# Patient Record
Sex: Female | Born: 1945
Health system: Southern US, Community
[De-identification: ages and names within clinical notes are randomized; demographics above are authoritative.]

## PROBLEM LIST (undated history)

## (undated) DIAGNOSIS — J302 Other seasonal allergic rhinitis: Secondary | ICD-10-CM

## (undated) DIAGNOSIS — I2510 Atherosclerotic heart disease of native coronary artery without angina pectoris: Secondary | ICD-10-CM

## (undated) DIAGNOSIS — I10 Essential (primary) hypertension: Secondary | ICD-10-CM

## (undated) DIAGNOSIS — E119 Type 2 diabetes mellitus without complications: Secondary | ICD-10-CM

## (undated) DIAGNOSIS — M1990 Unspecified osteoarthritis, unspecified site: Secondary | ICD-10-CM

## (undated) DIAGNOSIS — K219 Gastro-esophageal reflux disease without esophagitis: Secondary | ICD-10-CM

## (undated) DIAGNOSIS — C801 Malignant (primary) neoplasm, unspecified: Secondary | ICD-10-CM

## (undated) DIAGNOSIS — E7800 Pure hypercholesterolemia, unspecified: Secondary | ICD-10-CM

## (undated) DIAGNOSIS — K760 Fatty (change of) liver, not elsewhere classified: Secondary | ICD-10-CM

## (undated) HISTORY — PX: OTHER SURGICAL HISTORY: SHX169

## (undated) HISTORY — DX: Type 2 diabetes mellitus without complications: E11.9

## (undated) HISTORY — DX: Atherosclerotic heart disease of native coronary artery without angina pectoris: I25.10

## (undated) HISTORY — DX: Essential (primary) hypertension: I10

## (undated) HISTORY — DX: Malignant (primary) neoplasm, unspecified: C80.1

## (undated) HISTORY — PX: PARTIAL HYSTERECTOMY: SHX80

## (undated) HISTORY — DX: Pure hypercholesterolemia, unspecified: E78.00

---

## 2001-04-13 ENCOUNTER — Encounter: Payer: Self-pay | Admitting: Family Medicine

## 2001-04-13 ENCOUNTER — Ambulatory Visit (HOSPITAL_COMMUNITY): Admission: RE | Admit: 2001-04-13 | Discharge: 2001-04-13 | Payer: Self-pay | Admitting: Family Medicine

## 2001-06-23 ENCOUNTER — Other Ambulatory Visit: Admission: RE | Admit: 2001-06-23 | Discharge: 2001-06-23 | Payer: Self-pay | Admitting: Family Medicine

## 2002-04-15 ENCOUNTER — Encounter: Payer: Self-pay | Admitting: Family Medicine

## 2002-04-15 ENCOUNTER — Ambulatory Visit (HOSPITAL_COMMUNITY): Admission: RE | Admit: 2002-04-15 | Discharge: 2002-04-15 | Payer: Self-pay | Admitting: Family Medicine

## 2002-09-15 ENCOUNTER — Ambulatory Visit (HOSPITAL_COMMUNITY): Admission: RE | Admit: 2002-09-15 | Discharge: 2002-09-15 | Payer: Self-pay | Admitting: Internal Medicine

## 2002-10-21 ENCOUNTER — Ambulatory Visit (HOSPITAL_COMMUNITY): Admission: RE | Admit: 2002-10-21 | Discharge: 2002-10-21 | Payer: Self-pay | Admitting: Internal Medicine

## 2002-10-21 ENCOUNTER — Encounter: Payer: Self-pay | Admitting: Internal Medicine

## 2002-11-22 ENCOUNTER — Ambulatory Visit (HOSPITAL_COMMUNITY): Admission: RE | Admit: 2002-11-22 | Discharge: 2002-11-22 | Payer: Self-pay | Admitting: General Surgery

## 2003-04-28 ENCOUNTER — Encounter: Payer: Self-pay | Admitting: Family Medicine

## 2003-04-28 ENCOUNTER — Ambulatory Visit (HOSPITAL_COMMUNITY): Admission: RE | Admit: 2003-04-28 | Discharge: 2003-04-28 | Payer: Self-pay | Admitting: Family Medicine

## 2004-04-30 ENCOUNTER — Ambulatory Visit (HOSPITAL_COMMUNITY): Admission: RE | Admit: 2004-04-30 | Discharge: 2004-04-30 | Payer: Self-pay | Admitting: Internal Medicine

## 2005-05-02 ENCOUNTER — Ambulatory Visit (HOSPITAL_COMMUNITY): Admission: RE | Admit: 2005-05-02 | Discharge: 2005-05-02 | Payer: Self-pay | Admitting: Family Medicine

## 2005-10-23 ENCOUNTER — Ambulatory Visit: Payer: Self-pay | Admitting: Internal Medicine

## 2005-10-25 ENCOUNTER — Ambulatory Visit (HOSPITAL_COMMUNITY): Admission: RE | Admit: 2005-10-25 | Discharge: 2005-10-25 | Payer: Self-pay | Admitting: Urgent Care

## 2005-11-19 ENCOUNTER — Ambulatory Visit: Payer: Self-pay | Admitting: Internal Medicine

## 2006-05-23 ENCOUNTER — Ambulatory Visit (HOSPITAL_COMMUNITY): Admission: RE | Admit: 2006-05-23 | Discharge: 2006-05-23 | Payer: Self-pay | Admitting: Family Medicine

## 2006-12-02 HISTORY — PX: CHOLECYSTECTOMY: SHX55

## 2007-05-29 ENCOUNTER — Ambulatory Visit (HOSPITAL_COMMUNITY): Admission: RE | Admit: 2007-05-29 | Discharge: 2007-05-29 | Payer: Self-pay | Admitting: Family Medicine

## 2007-09-11 ENCOUNTER — Inpatient Hospital Stay (HOSPITAL_COMMUNITY): Admission: EM | Admit: 2007-09-11 | Discharge: 2007-09-13 | Payer: Self-pay | Admitting: Emergency Medicine

## 2007-09-11 ENCOUNTER — Encounter: Payer: Self-pay | Admitting: Emergency Medicine

## 2007-09-11 HISTORY — PX: CARDIAC CATHETERIZATION: SHX172

## 2009-06-06 ENCOUNTER — Ambulatory Visit (HOSPITAL_COMMUNITY): Admission: RE | Admit: 2009-06-06 | Discharge: 2009-06-06 | Payer: Self-pay | Admitting: Family Medicine

## 2010-12-22 ENCOUNTER — Encounter: Payer: Self-pay | Admitting: Internal Medicine

## 2011-04-16 NOTE — Discharge Summary (Signed)
[name], [name]                 ACCOUNT NO.:  [id number]   MEDICAL RECORD NO.:  [id number]          PATIENT TYPE:  INP   LOCATION:  3729                         FACILITY:  [facility]   PHYSICIAN:  Darlin Priestly, MD  DATE OF BIRTH:  Apr 30, 1946   DATE OF ADMISSION:  09/11/2007  DATE OF DISCHARGE:  09/13/2007                               DISCHARGE SUMMARY   DISCHARGE DIAGNOSES:  1. Non-ST elevation myocardial infarction.  2. Coronary artery disease three-vessel undergoing cardiac      catheterization and PTCA and non-drug-eluting Liberte stent to the      left circumflex midportion reducing 100% stenosis to less than 10%.      a.     Residual coronary disease of 80% LAD stenosis that will be       addressed in the future.  Please note ejection fraction was 60%.  3. Hypertension.  4. Dyslipidemia.   DISCHARGE CONDITION:  Improved.   PROCEDURES:  1. Left heart catheterization on September 11, 2007 by Dr. Lynnea Ferrier.  2. September 11, 2007 PTCA and stent deployment to the circumflex by Dr.      Lenise Herald.   DISCHARGE MEDICATIONS:  1. Lopressor 50 mg one twice a day.  2. Altace 2.5 mg daily.  3. Aspirin 325 daily.  4. Plavix 75 mg daily.  Do not stop.  It could cause a heart attack.  5. Zocor 80 mg at bedtime.  6. Imdur 30 mg daily.  7. Laxative as before.  8. Nitroglycerin 1/150th sublingual as needed for chest pain one every      five minutes up to 3/15.  If pain continues, call 911.  9. Stop the blood pressure medicine you are taking.   DISCHARGE INSTRUCTIONS:  1. No work until you see Dr. Lynnea Ferrier.  2. Low salt, heart healthy, low sugar diet.  3. No lifting for two weeks.  No driving for two weeks.  4. Increase activity slowly.  5. Wash right groin catheterization site with soap and water.  Call if      any bleeding, swelling or drainage.  6. Follow with Dr. Lynnea Ferrier.  The office will arrange.   HISTORY OF PRESENT ILLNESS:  A 65 year old African-American female with  no  prior heart disease, some hypertension, but she was given samples  from her primary care physician which she only would take when she  thought she needed it.  The patient on the 7th of October developed left  arm and mid sternal chest pain, no associated symptoms of nausea,  shortness of breath or diaphoresis.  She was unable to sleep because of  the discomfort on the night prior to admission, and she was  significantly uncomfortable.  She went to the emergency room as she  could not take the discomfort anymore.  She was given oxygen and a GI  cocktail as well as nitroglycerin, morphine, and baby aspirin.  Her  troponins were positive, and her MB's were up.  She was then transferred  by [facility] to [facility].  In the emergency room she was without  pain, but  we have a cath lab open so we discussed the risks and benefits  of cardiac catheterization and she agreed to proceed with elective but  urgent catheterization.   Results of catheterization with 100% circumflex occlusion and 80% LAD  stenosis as well.  She underwent stenting and was then admitted to the  CCU.   PAST MEDICAL HISTORY:  Some mild blood pressure elevation treated with  when she felt she needed it meds at her choice.  She has had a  cholecystectomy in 2003 and a colonoscopy in 2003.   ALLERGIES:  No allergies.   Family History, Social History, Review of Systems, see H&P.   PHYSICAL EXAMINATION AT DISCHARGE:  VITAL SIGNS:  Blood pressure 117/68,  pulse 67, temp 98, room air oxygen saturation 99%.  HEART:  Regular rate and rhythm.  No murmur, gallop, rub or click.  LUNGS:  Clear to auscultation bilaterally.  ABDOMEN:  Soft, nontender.  EXTREMITIES:  Cath site stable, pedal pulses are present.   LABORATORY DATA:  Hemoglobin on admission 13.7, hematocrit 40.6, WBC  11.1, platelets 210, neutrophils 79 at discharge, hemoglobin 11,  hematocrit 32.1, WBC had come down to 7.7, platelets 169.   Chemistry on admission:   Sodium 137, potassium 3.7, chloride 103, CO2  27, BUN 9, creatinine 0.83, glucose was slightly up at 173.  Electrolytes essentially remained stable.  Glucose was up during  hospitalization.  She was on D5-1/2 fluids, but we did do a  glycohemoglobin which is still pending.  Coags on admission on heparin  protime 14.5, INR 1.1, PTT greater than 200.  LFTs were entirely normal.  __________ CK 477, MB 47.5, troponin 5.91. That was the peak.  By the  early morning of the 12th CK 320, MB 6.3, troponin I 2.11.   Cholesterol 200, LDL 143, HDL 32, triglycerides 125.  She was discharged  on Zocor 80.  Calcium 8.6.  Magnesium 2. Glycohemoglobin as stated is  pending.  Will need to be followed.  TSH 0.517.   Chest x-rays:  On admission both lungs were clear.  No acute findings  were seen.   DISCHARGE INSTRUCTIONS:  As well as following up with Dr. Lynnea Ferrier we  will also do a 2-D echo as an outpatient.  The office will call to  arrange those.   HOSPITAL COURSE:  [name] was admitted by Dr. Lynnea Ferrier due to non-ST  elevation MI.  She was actually transferred from [facility] and then  seen in the emergency room here at [facility].  Went to the cath lab more  urgently as one was available, and patient was willing to proceed with  procedures as stated.  Post catheterization she did well.  Some right  groin discomfort but no hematoma.  She continued to improve.  At times  her pressure was elevated, and  by October 12 she was seen and examined by Dr. Lynnea Ferrier and felt ready  for discharge.  She will follow up as an outpatient with  Dr. Lynnea Ferrier and then plans to intervene on her LAD will be done at that  time.      [name]      Darlin Priestly, MD  Electronically Signed    LRI/MEDQ  D:  09/13/2007  T:  09/14/2007  Job:  [id number]   cc:   [name], M.D.  Cindy Slot, MD

## 2011-04-16 NOTE — Cardiovascular Report (Signed)
Cindy [name], [name]                 ACCOUNT NO.:  [id number]   MEDICAL RECORD NO.:  [id number]          PATIENT TYPE:  INP   LOCATION:  2914                         FACILITY:  [facility]   PHYSICIAN:  Ritta Slot, MD     DATE OF BIRTH:  Jun 26, 1946   DATE OF PROCEDURE:  09/11/2007  DATE OF DISCHARGE:                            CARDIAC CATHETERIZATION   PROCEDURES PERFORMED:  1. Selective coronary angiography and atrial coronary circulation by      the Judkins technique.  2. Retrograde left heart catheterization.  3. Left ventricular angiography.   COMPLICATIONS:  None.   ENTRY SITE:  Right femoral artery.   VALUES ON THE PATIENT PROFILE:  The patient is a 65 year old lady with  no known risk factors for coronary artery disease who comes in with a 2-  day history of chest discomfort.  She presented to the emergency room up  at [facility] today where she was found to have ST segment  depression inferolaterally with positive enzymes.  She was transferred  here to [facility] for further evaluation.  She was seen in the  emergency room and it was felt that urgent transfer to the cardiac  catheterization lab was prudent as she had ongoing chest pain with  positive enzymes and ECG.   The patient was brought to the second floor of [facility] to  the Cardiac Catheterization Lab.  The right groin was prepped and draped  in the usual sterile fashion.  Lidocaine 1% 15 mL was infiltrated to the  right groin for local anesthesia.  She was given 2 mg of Versed and 25  mcg of fentanyl for sedation intravenously.  The right femoral artery  was accessed using a Cook needle.  The J wire was passed through the  [place] needle, passed up into the aorta.  The Cook needle was removed and  a 6-French sheath was placed over the J wire into the right femoral  artery.  Through the right femoral artery was inserted a JR4 catheter  with the J wire as a guide.  The JR4 catheter was inserted into  the  right coronary artery.  Dye was advanced down the right coronary artery  visualized in the right coronary artery.  The JR4 catheter was then  removed over a J wire and the JL4 catheter was placed down the J wire  into the descending aorta and up the ascending aortic arch and then down  the ascending aorta.  The JL4 catheter was placed into the left main  coronary artery.  Dye was placed down the left main coronary artery for  visualization of the left coronary artery system.  The left coronary  artery system was visualized in the LAO cranial, LAO caudal, RAO caudal,  RAO cranial and AP caudal views.   FINDINGS:  Pressures:  The aortic pressure was 158/77 with a mean of  108.  The left ventricular pressure was 158 with a diastolic of 17, with  an end diastolic of 25.  Pullback pressure in the aorta was 139/77/104.  There was no significant  aortic valve gradient by pullback technique.   FINDINGS:  1. Right coronary artery:  There is diffuse disease down the right      coronary artery.  There was a 60%lesion proximal.  The right      coronary artery was dominant.  2. Left main coronary artery:  The left main coronary artery was free      of any disease.  3. Left circumflex coronary artery was 100% occluded in its proximal      portion with no flow distally.  4. Left anterior descending artery was long and patent throughout its      course, but there was a significant atympanosis just after the      bifurcation at the first diagonal.  5. Left ventriculography:  The left ventriculography was performed,      300 mL of dye were injected at 10 mL per second.  There was no      evidence of a significant LV dysfunction.  Her EF was reported at      60%.   IMPRESSION:  Acute coronary syndrome with 100% occlusion of the left  circumflex artery.  She also had 3-vessel disease with significant  disease in the left anterior descending and right coronary artery.   PLAN:  She will proceed to  percutaneous coronary intervention to the  left circumflex system to abort an acute coronary syndrome.  Dr. Jenne Campus  will be doing this.  Once this has been completed, she will return to  the CCU for inpatient hospitalization.  The left coronary artery will  need to be treated probably within the next week or so with PCI to the  LAD.  She will need to be treated with full medical therapy including  stat aspirin, Plavix and beta blockade.      Ritta Slot, MD  Electronically Signed     HS/MEDQ  D:  09/11/2007  T:  09/12/2007  Job:  [id number]

## 2011-04-16 NOTE — H&P (Signed)
[name], [name]                 ACCOUNT NO.:  [id number]   MEDICAL RECORD NO.:  [id number]           PATIENT TYPE:   LOCATION:                                 FACILITY:   PHYSICIAN:  Cindy Slot, MD     DATE OF BIRTH:  1946-06-26   DATE OF ADMISSION:  DATE OF DISCHARGE:                              HISTORY & PHYSICAL   CHIEF COMPLAINT:  Chest pain and with radiation to the left arm since  Wednesday x2 days now.   HISTORY OF PRESENT ILLNESS:  A 65 year old African American female with  no prior history of coronary disease, some history of hypertension (she  was given samples by primary care, but she only takes it once in a  while, she does not know the name).  She had been in her usual state of  health until this Wednesday, September 09, 2007; she developed left arm  discomfort, and midsternal chest pain, no radiation to her back.  No  associated symptoms of nausea, shortness of breath, or diaphoresis, but  it would kind of come-and-go.  She was unable to sleep now for two  nights, and she was very frustrated.  She thought it was something she  ate originally, but by this morning she felt she had to be seen so she  went to the emergency room.  There she was given oxygen, IV Protonix, IV  Pepcid.  Also she was started on 1000 mg of IV heparin bolus, and a 5000  unit bolus and a drip at 1000, and then a nitroglycerin drip.  Her  troponin was elevated at 0.21, this was the initial one; and the MB was  9.4 which was positive.  The ER physician called Dr. Lynnea [name] here, at  [facility], we transferred her to [facility] by [facility].  Here, she was  totally pain free, and nonspecific ST changes in her lateral leads.  Due  to the type of pain, and the availability of the cath lab.  We did an  urgent elective cath.  I discussed risks with the patient; a 1% chance  of stroke, heart attack or death with cardiac cath, and chance of  hematoma at the site.  She was agreeable to proceed, and we  have taken  her to the cath lab.   PAST MEDICAL HISTORY:  No prior cardiac cath; no history of any chest  pain, though she is a little vague when asked about previous chest pain;  she does state that she gets stressed at work because she does a lot of  lifting.  She does have a history of hypertension.  She was given some  meds by her primary care physician, some samples, but she only takes  those once in a while.  Other history includes cholecystectomy in 2003,  and a colonoscopy in 2003.  The colonoscopy was fine.   ALLERGIES:  No known allergies.  Denies shellfish or iodine allergies.   OUTPATIENT MEDS:  Occasional blood pressure med and MiraLax or some type  of powdered Maalox, she is unsure.   SOCIAL HISTORY:  She lives in [place].  She is single.  She has one  daughter and three grandchildren.  She never smoked, never used tobacco.  No alcohol use.  She exercises by walking.  She works on a daily basis  lifting heavy items.   FAMILY HISTORY:  Mother is a living at age 61, she has a pacemaker,  unknown if she has coronary disease.  Father unknown history.  Two  sisters, two brothers, but no coronary disease that the patient is aware  of.   REVIEW OF SYSTEMS:  GENERAL: No recent colds or fevers.  No specific  weight changes.  HEENT: She has some nasal congestion, now, but no  headache, no blurred vision.  SKIN: Without rashes.  CARDIOVASCULAR: As  stated with chest pain.  GENITOURINARY:  No hematuria or dysuria.  NEURO:  Some increase stress at work due to the economic time.  MUSCULOSKELETAL:  No arthritic pain.  GASTROINTESTINAL: Occasional  constipation.  She takes some type of a stool softener daily, in a  powdered form, but no melena.  ENDOCRINE:  No diabetes that she is aware  of, no thyroid disease.  No history of syncope either.   PHYSICAL EXAM:  VITAL SIGNS:  Blood pressure 163/85, pulse 69,  respiratory rate 16, temperature 98.5, oxygen saturation on room air   100%.  GENERAL:  Alert and oriented African-American female in no acute  distress now.  Pleasant affect.  SKIN:  Warm and dry, brisk capillary refill.  HEENT: Sclerae are clear.  Normocephalic.  NECK:  Supple.  No JVD, no bruits.  No adenopathy.  LUNGS:  Clear without rales, rhonchi, or wheezes.  HEART:  Heart sounds S1-S2 regular rate and rhythm without murmur,  gallop, rub, or click.  ABDOMEN:  Soft, nontender, positive bowel sounds.  I do not palpate  liver, spleen, or masses.  EXTREMITIES:  Lower extremities without edema, 2+ pedals bilaterally.  NEURO:  Alert and oriented x3.  Follows commands, moves all extremities.   Chest x-ray is pending.  EKG heart rate was 64 no Q waves, but peak T  wave in V2 and nonspecific ST changes in her lateral leads.   LABORATORY DATA:  Hemoglobin 13.7, hematocrit 40.6, WBC slightly up at  11.1.  BUN 9, creatinine 0.86 and MB is 9.4, troponin 0.21, magnesium is  2.0.  INR of 1.1 and LFTs were normal.  The patient was seen and  examined by Dr. Lynnea [name].  She is on IV heparin, IV nitroglycerin,  and  she is being taken to the cath lab for further evaluation with non-Q-  wave MI non-ST elevation MI.  She will be admitted to the CCU.  We have  attempted to call her daughter several times, there is no answer.  We  did reach a friend of [name] who will be coming to the hospital.      [name], N.P.      Cindy Slot, MD  Electronically Signed    LRI/MEDQ  D:  09/11/2007  T:  09/12/2007  Job:  [id number]   cc:   [name], M.D.  Cindy Slot, MD

## 2011-04-16 NOTE — Cardiovascular Report (Signed)
[name], [name]                 ACCOUNT NO.:  [id number]   MEDICAL RECORD NO.:  [id number]          PATIENT TYPE:  INP   LOCATION:  3729                         FACILITY:  [facility]   PHYSICIAN:  Darlin Priestly, MD  DATE OF BIRTH:  July 21, 1946   DATE OF PROCEDURE:  09/11/2007  DATE OF DISCHARGE:  09/11/2007                            CARDIAC CATHETERIZATION   PROCEDURE:  1. Left circumflex - mid. Percutaneous transluminal coronary balloon      angioplasty.  2. Placement of intracoronary stent.   ATTENDING SURGEON:  Darlin Priestly, M.D.   COMPLICATIONS:  None.   INDICATIONS:  [name] is a 65 year old female, patient of [facility], with a history of hypertension, questionable noncompliance, who  began complaining of intermittent chest pain on and off for the last  several days.  This morning after approximately 8:00 a.m., she noted  marked increase in her substernal chest pain and was subsequently seen  at the [facility] ER where it was felt she had acute ischemia.  She  subsequently transferred to [facility] for urgent catheterization which  was performed by Dr. Lynnea Ferrier.  This revealed scattered sequential 80%  lesions in the LAD, totally occluded mid AV groove circumflex and  scattered disease throughout the proximal RCA with normal EF.  She is  now referred for percutaneous intervention of the circumflex during  acute MI.   DESCRIPTION OF OPERATION:  After obtaining informed consent, the  diagnostic catheters were removed, and a 6-French XB 3.5 guiding  catheter with side holes engaged in the left coronary ostium.  Next, a  0.014 [place] guidewire was advanced down the guiding catheter and used  to cross the mid AV groove circumflex stenotic lesion and positioned in  the second OM without difficulty.  Over this, a 2.5 x 15 Maverick  balloon was then used to cross the total occlusion, and 2 inflations to  a maximum of 10 atmospheres was performed for a total of 27  seconds.  Follow-up angiogram revealed good luminal gain with a small linear  dissection present and TIMI 3 flow.  This balloon was removed, and a  Liberte 22.75 x 16-mm stent was then positioned across the mid AV groove  stenotic lesion.  It should be noted this stent was chosen as a non-DES  stent secondary to the questionable history of the patient's  noncompliance.  This stent was then deployed to 9 atmospheres for a  total of 14 seconds.  Follow-up angiogram revealed excellent luminal  gain.  Stent balloon was then removed, and a Quantum Maverick 2.75 x 8-  mm balloon was then positioned in the distal portion of the stent and 2  overlapping inflations to a maximum of 16 atmospheres were performed for  total of 30 seconds.  Follow-up angiogram revealed no evidence of  dissection or thrombus with TIMI 3 flow in the distal vessel.  IV  Integrilin was used throughout the case.  Intravenous dose of heparin  given to maintain an ACE between 200 and 300.   Final orthogonal angiograms revealed less than 10% residual stenosis  in  the AV groove circumflex with TIMI 3 flow in the distal vessel.  At this  point, we elected to conclude the procedure.  All balloons, wires and  catheters removed.  Hemostatic sheath was sewn in place, and the patient  was transferred back to the ward in stable condition.   CONCLUSION:  1. Successful percutaneous transluminal coronary balloon angioplasty.      Placement of a Liberte 2.75 x 16-mm stent (non-drug-eluting stent)      in the mid AV groove circumflex stenotic lesion postdilated to 2.78      mm.  2. Significant three-vessel coronary artery disease.  3. Adjuvant use of Integrilin infusion.  4. Normal LV systolic function.  5. Systemic hypertension.      Darlin Priestly, MD  Electronically Signed     RHM/MEDQ  D:  09/11/2007  T:  09/12/2007  Job:  [id number]   cc:   [facility]  Ritta Slot, MD

## 2011-04-19 NOTE — Op Note (Signed)
NAME:  [name], [name]                           ACCOUNT NO.:  [id number]   MEDICAL RECORD NO.:  [id number]                   PATIENT TYPE:  AMB   LOCATION:  DAY                                  FACILITY:  [facility]   PHYSICIAN:  Dalia Heading, M.D.               DATE OF BIRTH:  Dec 19, 1945   DATE OF PROCEDURE:  DATE OF DISCHARGE:                                 OPERATIVE REPORT   PREOPERATIVE DIAGNOSIS:  Biliary colic, cholelithiasis.   POSTOPERATIVE DIAGNOSIS:  Biliary colic, cholelithiasis.   PROCEDURE:  Laparoscopic cholecystectomy   SURGEON:  Dalia Heading, M.D.   ANESTHESIA:  General endotracheal   INDICATIONS:  The patient is a 65 year old black female who was referred for  evaluation and treatment of biliary colic secondary to cholelithiasis.  The  risks and benefits of the procedure including bleeding, infection,  hepatobiliary injury, and the possibility of an open procedure were fully  explained to the patient, who gave informed consent.   DESCRIPTION OF PROCEDURE:  The patient was placed in the supine position.  After induction of general endotracheal anesthesia, the abdomen was prepped  and draped using the usual sterile technique with Betadine.  Surgical site  confirmation was performed.   An supraumbilical incision was made down to the fascia.  A Veress needle was  introduced into the abdominal cavity and confirmation of placement was done  using the saline drop test.  The abdomen was then insufflated to 16 mmHg  pressure.  An 11-mm trocar was introduced into the abdominal cavity under  direct visualization without difficulty.  The patient was placed then in  reverse Trendelenburg position and an additional 11-mm trocar was placed in  the epigastric region and 5-mm trocars were placed in the right upper  quadrant and right flank regions. The liver was inspected and noted to be  within normal limits.  The gallbladder was retracted superiorly and  laterally.  The  dissection was begun around the infundibulum of the  gallbladder.  The cystic duct was first identified.  Its junction to the  infundibulum fully identified.  Endoclips were placed proximally and  distally on the cystic duct; and the cystic duct was divided.  This was  likewise done on the cystic artery.  The gallbladder was then freed away  from the gallbladder fossa using Bovie electrocautery.  The gallbladder was  delivered through the epigastric trocar site using an EndoCatch bag.  The  gallbladder fossa was inspected and no abnormal bleeding or bile leakage was  noted.  Surgicel was placed in the gallbladder fossa, the subhepatic space,  as well as the right hepatic gutter were irrigated with normal saline.  All  fluid and air were then evacuated from the abdominal cavity prior to removal  of the trocars.   All wounds were irrigated with normal saline.  All wounds were injected with  0.5% Sensorcaine.  The  supraumbilical fascia as well as epigastric fascia  were reapproximated using an #0 Vicryl interrupted suture. All skin  incisions were closed using staples.  Betadine ointment and dry sterile  dressings were applied.   All tape and needle counts correct at the end of the procedure.  The patient  was extubated in the operating room and went back to recovery room in awake  and stable condition.   COMPLICATIONS:  None.   SPECIMENS:  Gallbladder with stones.   BLOOD LOSS:  Minimal.                                               Dalia Heading, M.D.    MAJ/MEDQ  D:  11/22/2002  T:  11/22/2002  Job:  [id number]   cc:   Madelin Rear. Sherwood Gambler, M.D.  [address]  [place]  [place] [address]  Fax: [phone]

## 2011-04-19 NOTE — H&P (Signed)
   NAME:  DYLLAN, KATS                           ACCOUNT NO.:  [id number]   MEDICAL RECORD NO.:  [id number]                  PATIENT TYPE:   LOCATION:                                       FACILITY:  [facility]   PHYSICIAN:  Dalia Heading, M.D.               DATE OF BIRTH:  31-Jan-1946   DATE OF ADMISSION:  DATE OF DISCHARGE:                                HISTORY & PHYSICAL   CHIEF COMPLAINT:  Biliary cholic secondary to cholelithiasis.   HISTORY OF PRESENT ILLNESS:  The patient is a 65 year old black female who  is referred for evaluation and treatment of biliary colic secondary to  cholelithiasis.  She has been having  right upper quadrant abdominal pain  intermittently for the past few months.  It seems to be getting worse.  No  fever, chills, or jaundice have been noted.  There is no history of nausea  and vomiting.  She avoids fatty foods.   PAST MEDICAL HISTORY:  Unremarkable.   PAST SURGICAL HISTORY:  Hysterectomy in the remote past.   ALLERGIES:  No known drug allergies.   CURRENT MEDICATIONS:  None.   REVIEW OF SYSTEMS:  Noncontributory.   PHYSICAL EXAMINATION:  On physical examination, the patient is a well-  nourished, well-developed black female in no acute distress.  She is  afebrile.  Vital signs are stable.  HEENT EXAMINATION:  Reveals no scleritis.  LUNGS:  Clear to auscultation, with equal breath sounds bilaterally.  HEART EXAM:  Regular rate and rhythm without S3, S4, murmurs.  ABDOMEN:  Soft with tenderness noted in the right lower quadrant to  palpation.  No hepatosplenomegaly, masses, hernias, rigidity noted.  Ultrasound of the gallbladder reveals a single gallstone with a normal  common bile duct.   IMPRESSION:  Biliary colic, cholelithiasis.    PLAN:  The patient is scheduled for a laporascopic cholecystectomy on  November 22, 2002.  The risks and benefits of the procedure, including  bleeding, infection, hepatobiliary injury, the open of an open  procedure  were fully explained to the patient, gained informed consent.                                                 Dalia Heading, M.D.    MAJ/MEDQ  D:  11/09/2002  T:  11/09/2002  Job:  [id number]   cc:   Madelin Rear. Sherwood Gambler, M.D.  [address]  [place]  [place] [address]  Fax: [phone]

## 2011-04-19 NOTE — Op Note (Signed)
NAME:  [name], [name]                           ACCOUNT NO.:  [id number]   MEDICAL RECORD NO.:  [id number]                   PATIENT TYPE:  AMB   LOCATION:  DAY                                  FACILITY:  [facility]   PHYSICIAN:  R. Roetta Sessions, M.D.              DATE OF BIRTH:  01/14/1946   DATE OF PROCEDURE:  09/15/2002  DATE OF DISCHARGE:                                 OPERATIVE REPORT   PROCEDURE:  Screening colonoscopy.   ENDOSCOPIST:  Gerrit Friends. Rourk, M.D.   INDICATIONS FOR PROCEDURE:  The patient is a 65-year-old lady devoid of any  lower GI tract symptoms.  No family history of colorectal neoplasia.  She  has never had her lower GI tract imaged.  She was sent over by Dr. Nobie Putnam  for colorectal cancer screening.  Colonoscopy is now being done as standard  screening maneuver.  This approach has been discussed with the patient at  length at the bedside.  The potential risks, benefits, and alternatives have  been reviewed, questions answered.  She is agreeable.  I think she is low  risk for conscious sedation.   DESCRIPTION OF PROCEDURE:  O2 saturation, blood pressure, pulse and  respirations were monitored throughout the entire procedure.   CONSCIOUS SEDATION:  Versed 3 mg IV, Demerol 50 mg IV in divided doses.   INSTRUMENT:  Olympus video chip adult colonoscope.   FINDINGS:  Digital rectal exam revealed no abnormalities.   ENDOSCOPIC FINDINGS:  The prep was adequate.   RECTUM:  Examination of the rectal mucosa including the retroflex view of  the anal verge revealed no abnormalities.   COLON:  The colonic mucosa was surveyed from the rectosigmoid junction  through the left transverse and right colon to the area of the appendiceal  orifice, ileocecal valve, and cecum.  These structures were well seen and  photographed for the record.  The colonic mucosa all the way to the cecum  appeared normal.   From the level of the cecum and ileocecal valve the scope was slowly  withdrawn; and all previously mentioned mucosal surfaces were again seen;  and, again, no abnormalities were observed.  The patient tolerated the  procedure well and was reacted in endoscopy.   IMPRESSION:  1. Normal rectum.  2. Normal colon.    RECOMMENDATIONS:  1. Follow up with Dr. Nobie Putnam.  2. Repeat colonoscopy in 10 years.                                               Jonathon Bellows, M.D.    RMR/MEDQ  D:  09/15/2002  T:  09/15/2002  Job:  [id number]   cc:   Patrica Duel, MD  [address]  [place]  [place] [address]  Fax: [phone]

## 2011-09-12 LAB — PROTIME-INR
INR: 1.1
Prothrombin Time: 14.5

## 2011-09-12 LAB — LIPID PANEL
Triglycerides: 125
VLDL: 25

## 2011-09-12 LAB — BASIC METABOLIC PANEL
BUN: 7
CO2: 26
Calcium: 8.8
Chloride: 109
Creatinine, Ser: 0.85
GFR calc Af Amer: >60
GFR calc non Af Amer: >60
Glucose, Bld: 120 — ABNORMAL HIGH
Potassium: 3.7
Potassium: 3.6
Sodium: 137

## 2011-09-12 LAB — TSH: TSH: 0.517

## 2011-09-12 LAB — CBC
HCT: 40.6
HCT: 32.1 — ABNORMAL LOW
HCT: 33.9 — ABNORMAL LOW
Hemoglobin: 13.7
MCHC: 33.5
MCV: 85.2
Platelets: 169
Platelets: 176
RDW: 12.5
RDW: 12.5
RDW: 12.6
WBC: 11.1 — ABNORMAL HIGH
WBC: 7.5

## 2011-09-12 LAB — CARDIAC PANEL(CRET KIN+CKTOT+MB+TROPI)
Relative Index: 2
Total CK: 320 — ABNORMAL HIGH
Total CK: 477 — ABNORMAL HIGH
Troponin I: 2.11
Troponin I: 5.91

## 2011-09-12 LAB — POCT CARDIAC MARKERS
CKMB, poc: 9.4
Operator id: 218581
Troponin i, poc: 0.21 — ABNORMAL HIGH

## 2011-09-12 LAB — COMPREHENSIVE METABOLIC PANEL
AST: 31
AST: 35
Albumin: 3.1 — ABNORMAL LOW
Albumin: 3.9
Alkaline Phosphatase: 81
BUN: 11
BUN: 9
Chloride: 102
Creatinine, Ser: 0.8
Creatinine, Ser: 0.87
GFR calc Af Amer: >60
GFR calc Af Amer: >60
Potassium: 3.7
Potassium: 3.9
Total Bilirubin: 1
Total Protein: 6
Total Protein: 7.4

## 2011-09-12 LAB — DIFFERENTIAL
Basophils Absolute: 0
Eosinophils Relative: 1
Lymphocytes Relative: 14
Lymphs Abs: 1.5
Monocytes Absolute: 0.6
Neutro Abs: 8.8 — ABNORMAL HIGH

## 2011-09-12 LAB — GLYCOHEMOGLOBIN, TOTAL: Hemoglobin-A1c: 6.3 % — ABNORMAL HIGH (ref <6.0)

## 2011-12-27 HISTORY — PX: OTHER SURGICAL HISTORY: SHX169

## 2012-03-24 HISTORY — PX: CARDIOVASCULAR STRESS TEST: SHX262

## 2012-05-21 ENCOUNTER — Encounter (INDEPENDENT_AMBULATORY_CARE_PROVIDER_SITE_OTHER): Payer: Self-pay | Admitting: *Deleted

## 2012-05-27 ENCOUNTER — Telehealth (INDEPENDENT_AMBULATORY_CARE_PROVIDER_SITE_OTHER): Payer: Self-pay | Admitting: *Deleted

## 2012-05-27 ENCOUNTER — Other Ambulatory Visit (INDEPENDENT_AMBULATORY_CARE_PROVIDER_SITE_OTHER): Payer: Self-pay | Admitting: *Deleted

## 2012-05-27 ENCOUNTER — Ambulatory Visit (INDEPENDENT_AMBULATORY_CARE_PROVIDER_SITE_OTHER): Payer: Medicare Other | Admitting: Internal Medicine

## 2012-05-27 ENCOUNTER — Encounter (INDEPENDENT_AMBULATORY_CARE_PROVIDER_SITE_OTHER): Payer: Self-pay | Admitting: Internal Medicine

## 2012-05-27 VITALS — BP 118/70 | HR 70 | Temp 98.9°F | Ht 65.0 in | Wt 180.7 lb

## 2012-05-27 DIAGNOSIS — R1031 Right lower quadrant pain: Secondary | ICD-10-CM

## 2012-05-27 DIAGNOSIS — I2510 Atherosclerotic heart disease of native coronary artery without angina pectoris: Secondary | ICD-10-CM | POA: Insufficient documentation

## 2012-05-27 DIAGNOSIS — R7401 Elevation of levels of liver transaminase levels: Secondary | ICD-10-CM

## 2012-05-27 DIAGNOSIS — R52 Pain, unspecified: Secondary | ICD-10-CM

## 2012-05-27 DIAGNOSIS — R109 Unspecified abdominal pain: Secondary | ICD-10-CM | POA: Insufficient documentation

## 2012-05-27 DIAGNOSIS — Z1211 Encounter for screening for malignant neoplasm of colon: Secondary | ICD-10-CM

## 2012-05-27 DIAGNOSIS — E7800 Pure hypercholesterolemia, unspecified: Secondary | ICD-10-CM | POA: Insufficient documentation

## 2012-05-27 DIAGNOSIS — I10 Essential (primary) hypertension: Secondary | ICD-10-CM | POA: Insufficient documentation

## 2012-05-27 DIAGNOSIS — R748 Abnormal levels of other serum enzymes: Secondary | ICD-10-CM

## 2012-05-27 NOTE — Progress Notes (Signed)
Subjective:     Patient ID: [name], female   DOB: 1946/08/23, 66 y.o.   MRN: [id number]  HPI Referred to our office by Dr. Alanda Amass to rt lower quadrant. She has had this pain off and on x 2-3 yrs.  She underwent a CT scan in the past and was normal. She underwent a colonoscopy in 2003 and was normal which was normal by Dr, Jena Gauss. Appetite is okay. No weight loss.  Occasionally acid reflux. She usually has a BM about 1-2 a day.  No changes in her stools. No melena or bright red rectal bleeding.  She rates the pain 5/10. The pain is constant.  Motrin will relieve the pain.   She has a hx of elevated transaminases while on lipid lower medications.  01/27/2012 AST 80, ALT 66.  Review of Systems see hpi Current Outpatient Prescriptions  Medication Sig Dispense Refill  . aspirin 81 MG tablet Take 81 mg by mouth daily.      . clopidogrel (PLAVIX) 75 MG tablet Take 75 mg by mouth daily.      Marland Kitchen glipiZIDE (GLUCOTROL XL) 5 MG 24 hr tablet Take 5 mg by mouth daily.      . metFORMIN (GLUCOPHAGE) 500 MG tablet Take 500 mg by mouth 2 (two) times daily with a meal.      . metoprolol succinate (TOPROL-XL) 50 MG 24 hr tablet Take 50 mg by mouth daily. Take with or immediately following a meal.      . pantoprazole (PROTONIX) 40 MG tablet Take 40 mg by mouth daily.      . ramipril (ALTACE) 10 MG tablet Take 10 mg by mouth daily.      . rosuvastatin (CRESTOR) 5 MG tablet Take 5 mg by mouth daily.       Current Outpatient Prescriptions on File Prior to Visit  Medication Sig Dispense Refill  . glipiZIDE (GLUCOTROL XL) 5 MG 24 hr tablet Take 5 mg by mouth daily.      . metFORMIN (GLUCOPHAGE) 500 MG tablet Take 500 mg by mouth 2 (two) times daily with a meal.      . metoprolol succinate (TOPROL-XL) 50 MG 24 hr tablet Take 50 mg by mouth daily. Take with or immediately following a meal.      . pantoprazole (PROTONIX) 40 MG tablet Take 40 mg by mouth daily.      . ramipril (ALTACE) 10 MG tablet Take 10 mg  by mouth daily.      . rosuvastatin (CRESTOR) 5 MG tablet Take 5 mg by mouth daily.       Past Medical History  Diagnosis Date  . Hypertension   . CAD (coronary artery disease)   . Diabetes mellitus     type 2 x  1 or 2 yrs   Past Surgical History  Procedure Date  . Left breast lumpectomy   . Partial hysterectomy   . Cholecystectomy 2008  . Coronary angioplasty with stent placement    History   Social History  . Marital Status: Single    Spouse Name: N/A    Number of Children: N/A  . Years of Education: N/A   Occupational History  . Not on file.   Social History Main Topics  . Smoking status: Never Smoker   . Smokeless tobacco: Not on file  . Alcohol Use: No  . Drug Use: No  . Sexually Active: Not on file   Other Topics Concern  . Not on file  Social History Narrative  . No narrative on file   Family Status  Relation Status Death Age  . Mother Deceased     CAD  . Sister Alive     good health  . Brother Alive     good health   No Known Allergies      Objective:   Physical Exam Filed Vitals:   05/27/12 1514  Height: 5\' 5"  (1.651 m)  Weight: 180 lb 11.2 oz (81.965 kg)       Assessment:   Rt lower quadrant pain for several years duration. ? Etiology. Elevated liver enzymes in the past. In need of screening colonoscopy.    Plan:    Colonoscopy with Dr. Karilyn Cota.  CT. Abdomen and pelvis for rt lower quadrant pain. cmet today

## 2012-05-27 NOTE — Telephone Encounter (Signed)
Patient needs movi prep 

## 2012-05-27 NOTE — Patient Instructions (Addendum)
Colonoscopy with Dr. Karilyn Cota. CT abdomen and pelvis with CM for abdominal pain.  Cmet today.

## 2012-05-28 ENCOUNTER — Telehealth (INDEPENDENT_AMBULATORY_CARE_PROVIDER_SITE_OTHER): Payer: Self-pay | Admitting: Internal Medicine

## 2012-05-28 LAB — COMPREHENSIVE METABOLIC PANEL
Albumin: 4.5 g/dL (ref 3.5–5.2)
BUN: 12 mg/dL (ref 6–23)
Calcium: 9.4 mg/dL (ref 8.4–10.5)
Chloride: 101 mEq/L (ref 96–112)
Glucose, Bld: 81 mg/dL (ref 70–99)
Potassium: 4.4 mEq/L (ref 3.5–5.3)

## 2012-05-28 MED ORDER — PEG-KCL-NACL-NASULF-NA ASC-C 100 G PO SOLR: 1.0000 | Freq: Once | ORAL | Status: DC

## 2012-05-28 NOTE — Telephone Encounter (Signed)
Will get a Abdominal US for elevated transaminases.

## 2012-05-28 NOTE — Addendum Note (Signed)
Addended by: Len Blalock on: 05/28/2012 03:44 PM   Modules accepted: Orders

## 2012-05-28 NOTE — Telephone Encounter (Signed)
[place] sch'd 06/01/12 @ 1045, npo after midnight, patient aware

## 2012-05-29 ENCOUNTER — Encounter (INDEPENDENT_AMBULATORY_CARE_PROVIDER_SITE_OTHER): Payer: Self-pay

## 2012-06-01 ENCOUNTER — Ambulatory Visit (HOSPITAL_COMMUNITY)
Admission: RE | Admit: 2012-06-01 | Discharge: 2012-06-01 | Disposition: A | Discharge status: Home / Self Care | Payer: Medicare Other | Source: Ambulatory Visit | Attending: Internal Medicine | Admitting: Internal Medicine

## 2012-06-01 DIAGNOSIS — E119 Type 2 diabetes mellitus without complications: Secondary | ICD-10-CM | POA: Insufficient documentation

## 2012-06-01 DIAGNOSIS — R7401 Elevation of levels of liver transaminase levels: Secondary | ICD-10-CM | POA: Insufficient documentation

## 2012-06-01 DIAGNOSIS — I2510 Atherosclerotic heart disease of native coronary artery without angina pectoris: Secondary | ICD-10-CM | POA: Insufficient documentation

## 2012-06-01 DIAGNOSIS — R7402 Elevation of levels of lactic acid dehydrogenase (LDH): Secondary | ICD-10-CM | POA: Insufficient documentation

## 2012-06-01 DIAGNOSIS — R748 Abnormal levels of other serum enzymes: Secondary | ICD-10-CM

## 2012-06-01 DIAGNOSIS — I10 Essential (primary) hypertension: Secondary | ICD-10-CM | POA: Insufficient documentation

## 2012-06-03 ENCOUNTER — Other Ambulatory Visit (HOSPITAL_COMMUNITY): Payer: Medicare Other

## 2012-06-05 ENCOUNTER — Encounter (HOSPITAL_COMMUNITY): Payer: Self-pay | Admitting: Pharmacy Technician

## 2012-06-09 ENCOUNTER — Telehealth (INDEPENDENT_AMBULATORY_CARE_PROVIDER_SITE_OTHER): Payer: Self-pay | Admitting: Internal Medicine

## 2012-06-09 NOTE — Telephone Encounter (Signed)
Needs Cmet in 1st of August and then one in September.

## 2012-06-10 ENCOUNTER — Telehealth (INDEPENDENT_AMBULATORY_CARE_PROVIDER_SITE_OTHER): Payer: Self-pay | Admitting: *Deleted

## 2012-06-10 DIAGNOSIS — R7401 Elevation of levels of liver transaminase levels: Secondary | ICD-10-CM

## 2012-06-10 NOTE — Telephone Encounter (Signed)
Per Delrae Rend the patient will need to have labs in August and September

## 2012-06-10 NOTE — Telephone Encounter (Signed)
This lab is due in September per [name]

## 2012-06-10 NOTE — Telephone Encounter (Signed)
Labs are noted for August and September

## 2012-06-17 ENCOUNTER — Encounter (HOSPITAL_COMMUNITY): Payer: Self-pay | Admitting: *Deleted

## 2012-06-17 ENCOUNTER — Ambulatory Visit (HOSPITAL_COMMUNITY)
Admission: RE | Admit: 2012-06-17 | Discharge: 2012-06-17 | Disposition: A | Discharge status: Home / Self Care | Payer: Medicare Other | Source: Ambulatory Visit | Attending: Internal Medicine | Admitting: Internal Medicine

## 2012-06-17 ENCOUNTER — Encounter (HOSPITAL_COMMUNITY)
Admission: RE | Disposition: A | Discharge status: Home / Self Care | Payer: Self-pay | Source: Ambulatory Visit | Attending: Internal Medicine

## 2012-06-17 DIAGNOSIS — E119 Type 2 diabetes mellitus without complications: Secondary | ICD-10-CM | POA: Insufficient documentation

## 2012-06-17 DIAGNOSIS — I10 Essential (primary) hypertension: Secondary | ICD-10-CM | POA: Insufficient documentation

## 2012-06-17 DIAGNOSIS — R1031 Right lower quadrant pain: Secondary | ICD-10-CM

## 2012-06-17 DIAGNOSIS — Z1211 Encounter for screening for malignant neoplasm of colon: Secondary | ICD-10-CM

## 2012-06-17 DIAGNOSIS — Z79899 Other long term (current) drug therapy: Secondary | ICD-10-CM | POA: Insufficient documentation

## 2012-06-17 DIAGNOSIS — K5730 Diverticulosis of large intestine without perforation or abscess without bleeding: Secondary | ICD-10-CM | POA: Insufficient documentation

## 2012-06-17 DIAGNOSIS — R198 Other specified symptoms and signs involving the digestive system and abdomen: Secondary | ICD-10-CM

## 2012-06-17 DIAGNOSIS — Z01812 Encounter for preprocedural laboratory examination: Secondary | ICD-10-CM | POA: Insufficient documentation

## 2012-06-17 DIAGNOSIS — R109 Unspecified abdominal pain: Secondary | ICD-10-CM | POA: Insufficient documentation

## 2012-06-17 HISTORY — PX: COLONOSCOPY: SHX5424

## 2012-06-17 HISTORY — DX: Fatty (change of) liver, not elsewhere classified: K76.0

## 2012-06-17 HISTORY — DX: Other seasonal allergic rhinitis: J30.2

## 2012-06-17 HISTORY — DX: Gastro-esophageal reflux disease without esophagitis: K21.9

## 2012-06-17 LAB — GLUCOSE, CAPILLARY: Glucose-Capillary: 106 mg/dL — ABNORMAL HIGH (ref 70–99)

## 2012-06-17 SURGERY — COLONOSCOPY
Anesthesia: Moderate Sedation

## 2012-06-17 MED ORDER — DICYCLOMINE HCL 10 MG PO CAPS: 10.0000 mg | ORAL_CAPSULE | Freq: Two times a day (BID) | ORAL | Status: DC

## 2012-06-17 MED ORDER — BENZONATATE 100 MG PO CAPS
ORAL_CAPSULE | ORAL | Status: AC
  Filled 2012-06-17: qty 2

## 2012-06-17 MED ORDER — MEPERIDINE HCL 50 MG/ML IJ SOLN
INTRAMUSCULAR | Status: DC | PRN
  Administered 2012-06-17 (×2): 25 mg via INTRAVENOUS

## 2012-06-17 MED ORDER — SODIUM CHLORIDE 0.45 % IV SOLN
Freq: Once | INTRAVENOUS | Status: AC
  Administered 2012-06-17: 1000 mL via INTRAVENOUS

## 2012-06-17 MED ORDER — MIDAZOLAM HCL 5 MG/5ML IJ SOLN
INTRAMUSCULAR | Status: AC
  Filled 2012-06-17: qty 10

## 2012-06-17 MED ORDER — STERILE WATER FOR IRRIGATION IR SOLN
Status: DC | PRN
  Administered 2012-06-17: 16:00:00

## 2012-06-17 MED ORDER — MEPERIDINE HCL 50 MG/ML IJ SOLN
INTRAMUSCULAR | Status: AC
  Filled 2012-06-17: qty 1

## 2012-06-17 MED ORDER — MIDAZOLAM HCL 5 MG/5ML IJ SOLN
INTRAMUSCULAR | Status: DC | PRN
  Administered 2012-06-17: 2 mg via INTRAVENOUS
  Administered 2012-06-17: 1 mg via INTRAVENOUS
  Administered 2012-06-17: 2 mg via INTRAVENOUS

## 2012-06-17 NOTE — Op Note (Signed)
COLONOSCOPY PROCEDURE REPORT  PATIENT:  [name]  MR#:  [id number] Birthdate:  February 28, 1946, 66 y.o., female Endoscopist:  Dr. Malissa Hippo, MD Referred By:  Dr. Susa Griffins, MD Procedure Date: 06/17/2012  Procedure:   Colonoscopy and ileoscopy.  Indications:  Patient is 68 old African female who's last colonoscopy was 10 years ago presented few months history of intermittent right-sided abdominal pain triggered with meals and associated with  mushy stools. She denies melena or rectal bleeding. She does not take OTC NSAIDs. She is on low-dose aspirin and clopidogrel. She is undergoing diagnostic colonoscopy.  Informed Consent:  The procedure and risks were reviewed with the patient and informed consent was obtained.  Medications:  Demerol 50 mg IV Versed 5 mg IV  Description of procedure:  After a digital rectal exam was performed, that colonoscope was advanced from the anus through the rectum and colon to the area of the cecum, ileocecal valve and appendiceal orifice. The cecum was deeply intubated. These structures were well-seen and photographed for the record. From the level of the cecum and ileocecal valve, the scope was slowly and cautiously withdrawn. The mucosal surfaces were carefully surveyed utilizing scope tip to flexion to facilitate fold flattening as needed. The scope was pulled down into the rectum where a thorough exam including retroflexion was performed. Terminal ileum was also examined.  Findings:   Prep fair. She had lot of thick liquid stool that was easily washed and not adherent to mucosa. Normal terminal ileum. Few diverticula at sigmoid colon. No endoscopic evidence of colitis. Normal rectal mucosa and anal rectal junction.  Therapeutic/Diagnostic Maneuvers Performed:  None  Complications:  None  Cecal Withdrawal Time:  11 minutes  Impression:  Normal terminal ileum. Few diverticula at sigmoid colon otherwise normal colonoscopy. She may have  IBS with right-sided pain. If she does not respond to low-dose and does cause moderate therapy will ask Dr. Sherwood Gambler discontinue metformin least for the time being.  Recommendations:  Standard instructions given. Isochronal mean 10 mg by mouth twice a day. Office visit in 4-6 weeks.  REHMAN,NAJEEB U  06/17/2012 4:38 PM  CC: Dr. Cassell Smiles., MD & Dr. Bonnetta Barry ref. provider found Dr. Susa Griffins, MD

## 2012-06-17 NOTE — H&P (Signed)
[name] is an 66 y.o. female.   Chief Complaint: Patient is here for colonoscopy. HPI: Patient is 66 year old African female who presents with intermittent pain right lower quadrant of abdomen of over 6 months duration. Pain is frequently not every day and seemed to be worse after eating. She also has noted change in her bowel habits stools are semi-formed to mushy. She denies rectal bleeding anorexia or weight loss. Last colonoscopy was in 2003. History is negative for inflammatory bowel disease or colorectal carcinoma.  Past Medical History  Diagnosis Date  . Hypertension   . CAD (coronary artery disease)   . Diabetes mellitus     type 2 x  1 or 2 yrs  . Seasonal allergies   . Fatty liver   . GERD (gastroesophageal reflux disease)     Past Surgical History  Procedure Date  . Left breast lumpectomy   . Partial hysterectomy   . Cholecystectomy 2008  . Coronary angioplasty with stent placement     History reviewed. No pertinent family history. Social History:  reports that she has never smoked. She does not have any smokeless tobacco history on file. She reports that she does not drink alcohol or use illicit drugs.  Allergies: No Known Allergies  Medications Prior to Admission  Medication Sig Dispense Refill  . aspirin EC 81 MG tablet Take 81 mg by mouth every morning.      . clopidogrel (PLAVIX) 75 MG tablet Take 75 mg by mouth every morning.       Marland Kitchen glipiZIDE (GLUCOTROL XL) 5 MG 24 hr tablet Take 5 mg by mouth every morning.       . metFORMIN (GLUCOPHAGE) 500 MG tablet Take 500 mg by mouth 2 (two) times daily with a meal.      . metoprolol (LOPRESSOR) 50 MG tablet Take 50 mg by mouth every morning.       . pantoprazole (PROTONIX) 40 MG tablet Take 40 mg by mouth every morning.       . peg 3350 powder (MOVIPREP) 100 G SOLR Take 1 kit (100 g total) by mouth once.  1 kit  0  . ramipril (ALTACE) 10 MG tablet Take 10 mg by mouth 2 (two) times daily.       . rosuvastatin  (CRESTOR) 5 MG tablet Take 5 mg by mouth at bedtime.       . triamterene-hydrochlorothiazide (MAXZIDE-25) 37.5-25 MG per tablet Take 1 tablet by mouth every other day.        Results for orders placed during the hospital encounter of 06/17/12 (from the past 48 hour(s))  GLUCOSE, CAPILLARY     Status: Abnormal   Collection Time   06/17/12  3:05 PM      Component Value Range Comment   Glucose-Capillary 106 (*) 70 - 99 mg/dL    No results found.  ROS  Blood pressure 169/64, pulse 54, temperature 97.8 F (36.6 C), temperature source Oral, resp. rate 18, SpO2 97.00%. Physical Exam  Constitutional: She appears well-developed and well-nourished.  HENT:  Mouth/Throat: Oropharynx is clear and moist.  Eyes: Conjunctivae are normal. No scleral icterus.  Neck: No thyromegaly present.  Cardiovascular: Normal rate, regular rhythm and normal heart sounds.   No murmur heard. Respiratory: Effort normal and breath sounds normal.  GI: Soft. She exhibits no distension and no mass. There is Tenderness: mild tenderness at LUQ.Marland Kitchen  Musculoskeletal: She exhibits no edema.  Lymphadenopathy:    She has no cervical adenopathy.  Neurological:  She is alert.  Skin: Skin is warm and dry.     Assessment/Plan Right lower quadrant abdominal pain and change in her bowel habits. Diagnostic colonoscopy.  [name] U 06/17/2012, 4:04 PM

## 2012-06-23 ENCOUNTER — Encounter (HOSPITAL_COMMUNITY): Payer: Self-pay | Admitting: Internal Medicine

## 2012-07-01 ENCOUNTER — Encounter (INDEPENDENT_AMBULATORY_CARE_PROVIDER_SITE_OTHER): Payer: Self-pay | Admitting: *Deleted

## 2012-07-01 ENCOUNTER — Other Ambulatory Visit (INDEPENDENT_AMBULATORY_CARE_PROVIDER_SITE_OTHER): Payer: Self-pay | Admitting: *Deleted

## 2012-07-01 DIAGNOSIS — R7401 Elevation of levels of liver transaminase levels: Secondary | ICD-10-CM

## 2012-07-11 LAB — HEPATIC FUNCTION PANEL
Albumin: 4 g/dL (ref 3.5–5.2)
Total Bilirubin: 0.5 mg/dL (ref 0.3–1.2)
Total Protein: 7.4 g/dL (ref 6.0–8.3)

## 2012-07-20 ENCOUNTER — Ambulatory Visit (HOSPITAL_COMMUNITY)
Admission: RE | Admit: 2012-07-20 | Discharge: 2012-07-20 | Disposition: A | Discharge status: Home / Self Care | Payer: Medicare Other | Source: Ambulatory Visit | Attending: Cardiovascular Disease | Admitting: Cardiovascular Disease

## 2012-07-20 DIAGNOSIS — R0609 Other forms of dyspnea: Secondary | ICD-10-CM | POA: Insufficient documentation

## 2012-07-20 DIAGNOSIS — I10 Essential (primary) hypertension: Secondary | ICD-10-CM | POA: Insufficient documentation

## 2012-07-20 DIAGNOSIS — R0989 Other specified symptoms and signs involving the circulatory and respiratory systems: Secondary | ICD-10-CM | POA: Insufficient documentation

## 2012-07-20 HISTORY — PX: DOPPLER ECHOCARDIOGRAPHY: SHX263

## 2012-07-20 NOTE — Progress Notes (Signed)
*  PRELIMINARY RESULTS* Echocardiogram 2D Echocardiogram has been performed.  Conrad [place] 07/20/2012, 11:29 AM

## 2012-07-22 ENCOUNTER — Ambulatory Visit (INDEPENDENT_AMBULATORY_CARE_PROVIDER_SITE_OTHER): Payer: Medicare Other | Admitting: Internal Medicine

## 2012-07-22 ENCOUNTER — Encounter (INDEPENDENT_AMBULATORY_CARE_PROVIDER_SITE_OTHER): Payer: Self-pay | Admitting: Internal Medicine

## 2012-07-22 VITALS — BP 112/56 | HR 60 | Temp 98.0°F | Ht 60.5 in | Wt 181.1 lb

## 2012-07-22 DIAGNOSIS — K589 Irritable bowel syndrome without diarrhea: Secondary | ICD-10-CM

## 2012-07-22 MED ORDER — DICYCLOMINE HCL 10 MG PO CAPS: 10.0000 mg | ORAL_CAPSULE | Freq: Two times a day (BID) | ORAL | Status: DC

## 2012-07-22 NOTE — Progress Notes (Signed)
Subjective:     Patient ID: [name], female   DOB: 24-Mar-1946, 66 y.o.   MRN: [id number]  HPI [name] os a 66 yr old female here today for f/u. She recently underwent a colonoscopy for rt lower quadrant pain. She was actually due for a screening colonoscopy. Appetite is okay. No weight loss. Sometimes the pain rt lower quadrant feels like a burning sensation.  The pain will come and go. She cannot associate eating with the pain. No nausea or vomiting. BM x 1 a day. No melena or bright red rectal bleeding.  Acid reflux controlled with Protonix.   06/17/2012 Colonoscopy for rt sided abdominal pain:Impression:  Normal terminal ileum.  Few diverticula at sigmoid colon otherwise normal colonoscopy.  She may have IBS with right-sided pain.     Review of Systems see hpi Current Outpatient Prescriptions  Medication Sig Dispense Refill  . aspirin EC 81 MG tablet Take 81 mg by mouth every morning.      . clopidogrel (PLAVIX) 75 MG tablet Take 75 mg by mouth every morning.       . dicyclomine (BENTYL) 10 MG capsule Take 1 capsule (10 mg total) by mouth 2 (two) times daily before a meal.  60 capsule  1  . glipiZIDE (GLUCOTROL XL) 5 MG 24 hr tablet Take 5 mg by mouth every morning.       . metFORMIN (GLUCOPHAGE) 500 MG tablet Take 500 mg by mouth 2 (two) times daily with a meal.      . metoprolol (LOPRESSOR) 50 MG tablet Take 50 mg by mouth every morning.       . pantoprazole (PROTONIX) 40 MG tablet Take 40 mg by mouth every morning.       . ramipril (ALTACE) 10 MG tablet Take 10 mg by mouth 2 (two) times daily.       . rosuvastatin (CRESTOR) 5 MG tablet Take 5 mg by mouth at bedtime.       . triamterene-hydrochlorothiazide (MAXZIDE-25) 37.5-25 MG per tablet Take 1 tablet by mouth every other day.      Marland Kitchen DISCONTD: dicyclomine (BENTYL) 10 MG capsule Take 1 capsule (10 mg total) by mouth 2 (two) times daily before a meal.  60 capsule  1   Past Medical History  Diagnosis Date  . Hypertension   . CAD  (coronary artery disease)   . Diabetes mellitus     type 2 x  1 or 2 yrs  . Seasonal allergies   . Fatty liver   . GERD (gastroesophageal reflux disease)    Past Surgical History  Procedure Date  . Left breast lumpectomy   . Partial hysterectomy   . Cholecystectomy 2008  . Coronary angioplasty with stent placement   . Colonoscopy 06/17/2012    Procedure: COLONOSCOPY;  Surgeon: Malissa Hippo, MD;  Location: [facility];  Service: Endoscopy;  Laterality: N/A;  100   Family Status  Relation Status Death Age  . Mother Deceased     CAD  . Sister Alive     good health  . Brother Alive     good health   No Known Allergies     Objective:   Physical Exam Filed Vitals:   07/22/12 1424  Height: 5' 0.5" (1.537 m)  Weight: 181 lb 1.6 oz (82.146 kg)   Alert and oriented. Skin warm and dry. Oral mucosa is moist.   . Sclera anicteric, conjunctivae is pink. Thyroid not enlarged. No cervical lymphadenopathy. Lungs clear.  Heart regular rate and rhythm.  Abdomen is soft. Bowel sounds are positive. No hepatomegaly. No abdominal masses felt. No tenderness.  No edema to lower extremities.       Assessment:    Rt lower quadrant pain with normal colonoscopy.   Probable IBS.     Plan:    Dicyclomine 10mg  twice a day before meals. PR in 1 month. OV in 3 months.

## 2012-07-22 NOTE — Patient Instructions (Addendum)
Dicyclomine 10mg  BID. PR in one month. OV in 3 months

## 2012-07-29 ENCOUNTER — Telehealth (INDEPENDENT_AMBULATORY_CARE_PROVIDER_SITE_OTHER): Payer: Self-pay | Admitting: Internal Medicine

## 2012-07-29 DIAGNOSIS — R109 Unspecified abdominal pain: Secondary | ICD-10-CM

## 2012-07-29 MED ORDER — HYOSCYAMINE SULFATE 0.125 MG SL SUBL: 0.1250 mg | SUBLINGUAL_TABLET | SUBLINGUAL | Status: DC | PRN

## 2012-07-29 NOTE — Telephone Encounter (Signed)
Prior authorization for Dicyclomine. Will try Levsin.

## 2012-10-22 ENCOUNTER — Ambulatory Visit (INDEPENDENT_AMBULATORY_CARE_PROVIDER_SITE_OTHER): Payer: Medicare Other | Admitting: Internal Medicine

## 2012-10-22 ENCOUNTER — Encounter (INDEPENDENT_AMBULATORY_CARE_PROVIDER_SITE_OTHER): Payer: Self-pay | Admitting: Internal Medicine

## 2012-10-22 VITALS — BP 126/60 | HR 60 | Temp 98.2°F | Ht 60.0 in | Wt 181.0 lb

## 2012-10-22 DIAGNOSIS — K589 Irritable bowel syndrome without diarrhea: Secondary | ICD-10-CM

## 2012-10-22 NOTE — Patient Instructions (Addendum)
OV 6 months. If any problems call our office.

## 2012-10-22 NOTE — Progress Notes (Signed)
Subjective:     Patient ID: [name], female   DOB: [birth year], 66 y.o.   MRN: [id number]  HPI Here today for a scheduled visit rt abdominal pain. When she has a BM the pain is relieved. She does occasionally has bloating. Appetite is okay. Thee has been no weight loss.  BM x 1-2 a day. No melena or bright red rectal bleeding. Very little acid reflux.  No nausea or vomiting.  She says she feels much better. Did not have Rx filled last visit due to cost of co-pay.   06/17/2012 Colonoscopy for rt sided abdominal pain:Impression:  Normal terminal ileum.  Few diverticula at sigmoid colon otherwise normal colonoscopy.  She may have IBS with right-sided pain.   Review of Systems see hpi  Current Outpatient Prescriptions  Medication Sig Dispense Refill  . aspirin EC 81 MG tablet Take 81 mg by mouth every morning.      . clopidogrel (PLAVIX) 75 MG tablet Take 75 mg by mouth every morning.       . dicyclomine (BENTYL) 10 MG capsule Take 1 capsule (10 mg total) by mouth 2 (two) times daily before a meal.  60 capsule  1  . glipiZIDE (GLUCOTROL XL) 5 MG 24 hr tablet Take 5 mg by mouth every morning.       . metFORMIN (GLUCOPHAGE) 500 MG tablet Take 500 mg by mouth 2 (two) times daily with a meal.      . metoprolol (LOPRESSOR) 50 MG tablet Take 50 mg by mouth every morning.       . pantoprazole (PROTONIX) 40 MG tablet Take 40 mg by mouth every morning.       . ramipril (ALTACE) 10 MG tablet Take 10 mg by mouth 2 (two) times daily.       . rosuvastatin (CRESTOR) 5 MG tablet Take 5 mg by mouth at bedtime.       . triamterene-hydrochlorothiazide (MAXZIDE-25) 37.5-25 MG per tablet Take 1 tablet by mouth every other day.      . hyoscyamine (LEVSIN/SL) 0.125 MG SL tablet Place 1 tablet (0.125 mg total) under the tongue every 4 (four) hours as needed for cramping.  60 tablet  2   Past Medical History  Diagnosis Date  . Hypertension   . CAD (coronary artery disease)   . Diabetes mellitus     type 2 x   1 or 2 yrs  . Seasonal allergies   . Fatty liver   . GERD (gastroesophageal reflux disease)    Past Surgical History  Procedure Date  . Left breast lumpectomy   . Partial hysterectomy   . Cholecystectomy 2008  . Coronary angioplasty with stent placement   . Colonoscopy 06/17/2012    Procedure: COLONOSCOPY;  Surgeon: Malissa Hippo, MD;  Location: [facility];  Service: Endoscopy;  Laterality: N/A;  100   No Known Allergies  Objective:   Physical Exam Filed Vitals:   10/22/12 1045  BP: 126/60  Pulse: 60  Temp: 98.2 F (36.8 C)  Height: 5' (1.524 m)  Weight: 181 lb (82.101 kg)   Alert and oriented. Skin warm and dry. Oral mucosa is moist.   . Sclera anicteric, conjunctivae is pink. Thyroid not enlarged. No cervical lymphadenopathy. Lungs clear. Heart regular rate and rhythm.  Abdomen is soft. Bowel sounds are positive. No hepatomegaly. No abdominal masses felt. No tenderness.  No edema to lower extremities.       Assessment:   IBS. She says her  symptoms are better. Pain relieved with BM.  Recent colonoscopy was normal.     Plan:    Rx for Levsin given to patient with 4 refills.  OV in 6 months. If any problems, she will call our office.

## 2012-11-02 ENCOUNTER — Telehealth: Payer: Self-pay | Admitting: *Deleted

## 2012-11-02 NOTE — Telephone Encounter (Signed)
Ms Muscarella is on our recall list for a colonoscopy with Dr Jena Gauss for Dec 2013.  The patient is being seen by Dr Patty Sermons office as well. I called to pt to verify if she will be following with Dr Karilyn Cota and she said yes and that she had her colonoscopy August 1 with him.  I am taking this pt off our recall list, since she is no longer going to be seen here. Thanks.

## 2012-12-21 ENCOUNTER — Other Ambulatory Visit (HOSPITAL_COMMUNITY): Payer: Self-pay | Admitting: Internal Medicine

## 2012-12-21 DIAGNOSIS — Z139 Encounter for screening, unspecified: Secondary | ICD-10-CM

## 2012-12-25 ENCOUNTER — Ambulatory Visit (HOSPITAL_COMMUNITY)
Admission: RE | Admit: 2012-12-25 | Discharge: 2012-12-25 | Disposition: A | Discharge status: Home / Self Care | Payer: Medicare Other | Source: Ambulatory Visit | Attending: Internal Medicine | Admitting: Internal Medicine

## 2012-12-25 DIAGNOSIS — M899 Disorder of bone, unspecified: Secondary | ICD-10-CM | POA: Insufficient documentation

## 2012-12-25 DIAGNOSIS — Z139 Encounter for screening, unspecified: Secondary | ICD-10-CM

## 2012-12-25 DIAGNOSIS — Z13820 Encounter for screening for osteoporosis: Secondary | ICD-10-CM | POA: Insufficient documentation

## 2012-12-25 DIAGNOSIS — Z1231 Encounter for screening mammogram for malignant neoplasm of breast: Secondary | ICD-10-CM | POA: Insufficient documentation

## 2013-04-08 ENCOUNTER — Encounter (INDEPENDENT_AMBULATORY_CARE_PROVIDER_SITE_OTHER): Payer: Self-pay | Admitting: *Deleted

## 2013-04-21 ENCOUNTER — Ambulatory Visit (INDEPENDENT_AMBULATORY_CARE_PROVIDER_SITE_OTHER): Payer: Medicare Other | Admitting: Internal Medicine

## 2013-05-11 ENCOUNTER — Other Ambulatory Visit: Payer: Self-pay | Admitting: Cardiovascular Disease

## 2013-05-11 LAB — TSH: TSH: 1.631 u[IU]/mL (ref 0.350–4.500)

## 2013-05-11 LAB — COMPREHENSIVE METABOLIC PANEL
Albumin: 4.2 g/dL (ref 3.5–5.2)
Alkaline Phosphatase: 101 U/L (ref 39–117)
Calcium: 9.3 mg/dL (ref 8.4–10.5)
Chloride: 104 mEq/L (ref 96–112)
Glucose, Bld: 228 mg/dL — ABNORMAL HIGH (ref 70–99)
Potassium: 4.2 mEq/L (ref 3.5–5.3)
Sodium: 137 mEq/L (ref 135–145)
Total Protein: 7.1 g/dL (ref 6.0–8.3)

## 2013-05-11 LAB — CBC WITH DIFFERENTIAL/PLATELET
Basophils Relative: 1 % (ref 0–1)
Hemoglobin: 13.3 g/dL (ref 12.0–15.0)
Lymphocytes Relative: 45 % (ref 12–46)
Lymphs Abs: 3 10*3/uL (ref 0.7–4.0)
MCHC: 33.3 g/dL (ref 30.0–36.0)
Monocytes Relative: 6 % (ref 3–12)
Neutro Abs: 3.1 10*3/uL (ref 1.7–7.7)
Neutrophils Relative %: 46 % (ref 43–77)
RBC: 4.73 MIL/uL (ref 3.87–5.11)
WBC: 6.7 10*3/uL (ref 4.0–10.5)

## 2013-05-11 LAB — LIPID PANEL
LDL Cholesterol: 66 mg/dL (ref 0–99)
Triglycerides: 150 mg/dL — ABNORMAL HIGH (ref <150)

## 2013-05-13 ENCOUNTER — Ambulatory Visit (INDEPENDENT_AMBULATORY_CARE_PROVIDER_SITE_OTHER): Payer: Medicare Other | Admitting: Internal Medicine

## 2013-05-13 ENCOUNTER — Encounter (INDEPENDENT_AMBULATORY_CARE_PROVIDER_SITE_OTHER): Payer: Self-pay | Admitting: Internal Medicine

## 2013-05-13 VITALS — BP 146/70 | HR 76 | Temp 98.0°F | Ht 60.5 in | Wt 184.0 lb

## 2013-05-13 DIAGNOSIS — R748 Abnormal levels of other serum enzymes: Secondary | ICD-10-CM

## 2013-05-13 DIAGNOSIS — K589 Irritable bowel syndrome without diarrhea: Secondary | ICD-10-CM

## 2013-05-13 NOTE — Progress Notes (Signed)
Subjective:     Patient ID: [name], female   DOB: 07/09/1946, 67 y.o.   MRN: [id number]  HPI Here today for f/u of her IBS. She also has hx of elevated liver enzymes. She tells me she is doing pretty good. She saw her cardiologist (Dr. Alanda Amass) and all was normal. Appetite is good. No weight loss.  She occasionally has rt mid abdominal pain and will take a Levsin and pain is relieved. She says that greasy foods will cause the pain.' She is avoiding greasy foods. BMs are normal.  No melena or rectal bleeding. Shei s retired.  Hx of elevated liver enzymes. Recent liver enzymes were normal.  CMP     Component Value Date/Time   NA 137 05/11/2013 0932   K 4.2 05/11/2013 0932   CL 104 05/11/2013 0932   CO2 26 05/11/2013 0932   GLUCOSE 228* 05/11/2013 0932   BUN 13 05/11/2013 0932   CREATININE 0.86 05/11/2013 0932   CREATININE 0.87 09/13/2007 0335   CALCIUM 9.3 05/11/2013 0932   PROT 7.1 05/11/2013 0932   ALBUMIN 4.2 05/11/2013 0932   AST 24 05/11/2013 0932   ALT 22 05/11/2013 0932   ALKPHOS 101 05/11/2013 0932   BILITOT 0.6 05/11/2013 0932   GFRNONAA >60 09/13/2007 0335   GFRAA  Value: >60        The eGFR has been calculated using the MDRD equation. This calculation has not been validated in all clinical 09/13/2007 0335  05/28/12 US abdomen: IMPRESSION:  Probable fatty infiltration of liver.  Prominent CBD though this could represent physiologic dilatation  post cholecystectomy; recommend correlation with LFTs.  If there is clinical concern for distal CBD obstruction, consider  MRCP imaging.  Otherwise negative exam. CBD 8mm.    06/17/2012 Colonoscopy for rt sided abdominal pain:Impression:  Normal terminal ileum.  Few diverticula at sigmoid colon otherwise normal colonoscopy.  She may have IBS with right-sided pain.   Review of Systems see hpi Current Outpatient Prescriptions  Medication Sig Dispense Refill  . aspirin EC 81 MG tablet Take 81 mg by mouth every morning.      .  clopidogrel (PLAVIX) 75 MG tablet Take 75 mg by mouth every morning.       . dicyclomine (BENTYL) 10 MG capsule Take 1 capsule (10 mg total) by mouth 2 (two) times daily before a meal.  60 capsule  1  . glipiZIDE (GLUCOTROL XL) 5 MG 24 hr tablet Take 5 mg by mouth every morning.       . metFORMIN (GLUCOPHAGE) 500 MG tablet Take 500 mg by mouth 2 (two) times daily with a meal.      . metoprolol (LOPRESSOR) 50 MG tablet Take 50 mg by mouth every morning.       . ramipril (ALTACE) 10 MG tablet Take 10 mg by mouth 2 (two) times daily.       . rosuvastatin (CRESTOR) 5 MG tablet Take 5 mg by mouth at bedtime.       . triamterene-hydrochlorothiazide (MAXZIDE-25) 37.5-25 MG per tablet Take 1 tablet by mouth every other day.      . hyoscyamine (LEVSIN/SL) 0.125 MG SL tablet Place 1 tablet (0.125 mg total) under the tongue every 4 (four) hours as needed for cramping.  60 tablet  2   No current facility-administered medications for this visit.   Past Medical History  Diagnosis Date  . Hypertension   . CAD (coronary artery disease)   . Diabetes mellitus  type 2 x  1 or 2 yrs  . Seasonal allergies   . Fatty liver   . GERD (gastroesophageal reflux disease)    Past Surgical History  Procedure Laterality Date  . Left breast lumpectomy    . Partial hysterectomy    . Cholecystectomy  2008  . Coronary angioplasty with stent placement    . Colonoscopy  06/17/2012    Procedure: COLONOSCOPY;  Surgeon: Malissa Hippo, MD;  Location: [facility];  Service: Endoscopy;  Laterality: N/A;  100   No Known Allergies      Objective:   Physical Exam  Filed Vitals:   05/13/13 0944  BP: 146/70  Pulse: 76  Temp: 98 F (36.7 C)  Height: 5' 0.5" (1.537 m)  Weight: 184 lb (83.462 kg)   Alert and oriented. Skin warm and dry. Oral mucosa is moist.   . Sclera anicteric, conjunctivae is pink. Thyroid not enlarged. No cervical lymphadenopathy. Lungs clear. Heart regular rate and rhythm.  Abdomen is soft.  Bowel sounds are positive. No hepatomegaly. No abdominal masses felt. No tenderness.  No edema to lower extremities.      Assessment:   IBS. Her syjmptoms are controlled at this time. She feels good. Liver enzymes are normal.    Plan:    Will see back in 6 months. Cmet in 6 months. Continue present medications.

## 2013-05-13 NOTE — Patient Instructions (Addendum)
OV in 6 months.  Cmet in 6 months

## 2013-05-14 ENCOUNTER — Telehealth (INDEPENDENT_AMBULATORY_CARE_PROVIDER_SITE_OTHER): Payer: Self-pay | Admitting: *Deleted

## 2013-05-14 DIAGNOSIS — K589 Irritable bowel syndrome without diarrhea: Secondary | ICD-10-CM

## 2013-05-14 NOTE — Telephone Encounter (Signed)
.  Per [name] patietn will need to have labs in 6 months.

## 2013-08-10 ENCOUNTER — Encounter: Payer: Self-pay | Admitting: Cardiovascular Disease

## 2013-09-20 ENCOUNTER — Other Ambulatory Visit: Payer: Self-pay | Admitting: Internal Medicine

## 2013-09-21 NOTE — Telephone Encounter (Signed)
Rx was sent to pharmacy electronically. 

## 2013-10-20 ENCOUNTER — Encounter (INDEPENDENT_AMBULATORY_CARE_PROVIDER_SITE_OTHER): Payer: Self-pay | Admitting: *Deleted

## 2013-10-20 ENCOUNTER — Other Ambulatory Visit (INDEPENDENT_AMBULATORY_CARE_PROVIDER_SITE_OTHER): Payer: Self-pay | Admitting: *Deleted

## 2013-10-20 DIAGNOSIS — K589 Irritable bowel syndrome without diarrhea: Secondary | ICD-10-CM

## 2013-11-11 ENCOUNTER — Other Ambulatory Visit: Payer: Self-pay | Admitting: *Deleted

## 2013-11-11 MED ORDER — ROSUVASTATIN CALCIUM 10 MG PO TABS: 10.0000 mg | ORAL_TABLET | Freq: Every day | ORAL | Status: DC

## 2013-11-11 NOTE — Telephone Encounter (Signed)
Rx was sent to pharmacy electronically. 

## 2013-11-13 LAB — COMPREHENSIVE METABOLIC PANEL
ALT: 27 U/L (ref 0–35)
AST: 28 U/L (ref 0–37)
Albumin: 4.1 g/dL (ref 3.5–5.2)
Calcium: 9.2 mg/dL (ref 8.4–10.5)
Chloride: 103 mEq/L (ref 96–112)
Potassium: 4.2 mEq/L (ref 3.5–5.3)

## 2013-11-15 ENCOUNTER — Encounter (INDEPENDENT_AMBULATORY_CARE_PROVIDER_SITE_OTHER): Payer: Self-pay | Admitting: Internal Medicine

## 2013-11-15 ENCOUNTER — Ambulatory Visit (INDEPENDENT_AMBULATORY_CARE_PROVIDER_SITE_OTHER): Payer: Medicare Other | Admitting: Internal Medicine

## 2013-11-15 VITALS — BP 110/70 | HR 68 | Temp 98.9°F | Resp 18 | Ht 60.0 in | Wt 183.1 lb

## 2013-11-15 DIAGNOSIS — K760 Fatty (change of) liver, not elsewhere classified: Secondary | ICD-10-CM

## 2013-11-15 DIAGNOSIS — R1031 Right lower quadrant pain: Secondary | ICD-10-CM

## 2013-11-15 DIAGNOSIS — K7689 Other specified diseases of liver: Secondary | ICD-10-CM

## 2013-11-15 DIAGNOSIS — R1011 Right upper quadrant pain: Secondary | ICD-10-CM

## 2013-11-15 NOTE — Patient Instructions (Signed)
Keep symptom diary as discussed for next 2-3 weeks. Physician will contact you with results of CT scan.

## 2013-11-15 NOTE — Progress Notes (Signed)
Presenting complaint;  Abdominal pain and right upper and lower quadrant.  Subjective:  Patient is 67 year old African American female who presents with several week history of pain under the right rib cage as well as RLQ. She is having this pain every day. This pain does not appear to be worse with meals. She states lately her bowels have been moving regularly but her pain has not eased. Her appetite is fair her weight has been stable. She denies nausea vomiting melena or rectal bleeding. She also complains of lower back pain which is made worse with physical activity such as sweeping or lifting heavy objects. However her abdominal pain does not seem to get worse with physical activity. She takes ibuprofen one to 2 at a time it seemed to provide some relief. A few weeks ago she noted pain in her low back radiating to her right leg and she fell. She has not followed up with her PCP. She denies hematuria or dysuria.  Current Medications: Current Outpatient Prescriptions  Medication Sig Dispense Refill  . aspirin EC 81 MG tablet Take 81 mg by mouth every morning.      . clopidogrel (PLAVIX) 75 MG tablet Take 75 mg by mouth every morning.       Marland Kitchen glipiZIDE (GLUCOTROL XL) 5 MG 24 hr tablet Take 5 mg by mouth every morning.       . hyoscyamine (LEVSIN SL) 0.125 MG SL tablet Place 0.125 mg under the tongue every 4 (four) hours as needed.      . metFORMIN (GLUCOPHAGE) 500 MG tablet Take 500 mg by mouth 2 (two) times daily with a meal.      . metoprolol (LOPRESSOR) 50 MG tablet TAKE 1/2 TABLET TWICE DAILY  60 tablet  6  . ramipril (ALTACE) 10 MG tablet Take 10 mg by mouth 2 (two) times daily.       . rosuvastatin (CRESTOR) 10 MG tablet Take 1 tablet (10 mg total) by mouth at bedtime.  30 tablet  6  . triamterene-hydrochlorothiazide (MAXZIDE-25) 37.5-25 MG per tablet Take 1 tablet by mouth every other day.      . dicyclomine (BENTYL) 10 MG capsule Take 1 capsule (10 mg total) by mouth 2 (two) times daily  before a meal.  60 capsule  1  . hyoscyamine (LEVSIN/SL) 0.125 MG SL tablet Place 1 tablet (0.125 mg total) under the tongue every 4 (four) hours as needed for cramping.  60 tablet  2   No current facility-administered medications for this visit.     Objective: Blood pressure 110/70, pulse 68, temperature 98.9 F (37.2 C), temperature source Oral, resp. rate 18, height 5' (1.524 m), weight 183 lb 1.6 oz (83.054 kg). Patient is alert and in no acute distress. Conjunctiva is pink. Sclera is nonicteric Oropharyngeal mucosa is normal. No neck masses or thyromegaly noted. Cardiac exam with regular rhythm normal S1 and S2. No murmur or gallop noted. Lungs are clear to auscultation. Abdomen is full. Bowel sounds are normal. No bruits noted. Abdomen is soft with mild tenderness below the right costal margin as well as RLQ and epigastric region but no guarding or rebound noted. No organomegaly or masses.  No LE edema or clubbing noted.  Labs/studies Results: She had ultrasound on 06/09/2012 revealing fatty liver prominent CBD and evidence of prior cholecystectomy. Lab data from 11/14/2023. Serum sodium 138, potassium 4.2, chloride 103, CO2 27, glucose 106, BUN 14, creatinine 0.78, calcium 9.2. Bilirubin 0.4, AP 83, AST 28, ALT 27 and  albumin 4.1.  Assessment:  #1. Abdominal pain at RUQ and RLQ. She has history of IBS. Lately she is having normal stools but pain has not resolved. Visceral etiology needs to be ruled out but it is quite likely that this pain is referred from her back.  She did have colonoscopy in July 2013 revealing normal terminal ileum and few diverticula and sigmoid colon. Will proceed with abdominopelvic CT and if it is negative would ask she followup with Dr. Sherwood Gambler. #2. History of fatty liver. Transaminases are normal.    Plan:  Abdominopelvic CT with contrast. Patient advised to keep symptom diary for the next 2-3 weeks. Further recommendations to follow.

## 2013-11-18 ENCOUNTER — Other Ambulatory Visit: Payer: Self-pay

## 2013-11-18 MED ORDER — CLOPIDOGREL BISULFATE 75 MG PO TABS: 75.0000 mg | ORAL_TABLET | Freq: Every morning | ORAL | Status: DC

## 2013-11-18 NOTE — Telephone Encounter (Signed)
Rx was sent to pharmacy electronically. 

## 2013-11-19 ENCOUNTER — Ambulatory Visit (HOSPITAL_COMMUNITY)
Admission: RE | Admit: 2013-11-19 | Discharge: 2013-11-19 | Disposition: A | Discharge status: Home / Self Care | Payer: Medicare Other | Source: Ambulatory Visit | Attending: Internal Medicine | Admitting: Internal Medicine

## 2013-11-19 ENCOUNTER — Telehealth (INDEPENDENT_AMBULATORY_CARE_PROVIDER_SITE_OTHER): Payer: Self-pay | Admitting: *Deleted

## 2013-11-19 ENCOUNTER — Encounter (HOSPITAL_COMMUNITY): Payer: Self-pay

## 2013-11-19 DIAGNOSIS — R1031 Right lower quadrant pain: Secondary | ICD-10-CM

## 2013-11-19 DIAGNOSIS — R1011 Right upper quadrant pain: Secondary | ICD-10-CM

## 2013-11-19 MED ORDER — IOHEXOL 300 MG/ML  SOLN
100.0000 mL | Freq: Once | INTRAMUSCULAR | Status: AC | PRN
  Administered 2013-11-19: 100 mL via INTRAVENOUS

## 2013-11-19 NOTE — Telephone Encounter (Signed)
Hemoccult negative.

## 2013-11-19 NOTE — Telephone Encounter (Signed)
   Diagnosis:    Result(s)   Card 1: Negative    Card 2:    Card 3:     Completed by: Granville Whitefield,LPN   HEMOCCULT SENSA DEVELOPER: LOT#:  [id number] EXPIRATION DATE: 2016-05   HEMOCCULT SENSA CARD:  LOT#:  [id number] EXPIRATION DATE: 2015-04   CARD CONTROL RESULTS:  POSITIVE: Postive NEGATIVE: Negative    ADDITIONAL COMMENTS:

## 2013-12-06 ENCOUNTER — Encounter: Payer: Self-pay | Admitting: Cardiovascular Disease

## 2013-12-06 ENCOUNTER — Ambulatory Visit (INDEPENDENT_AMBULATORY_CARE_PROVIDER_SITE_OTHER): Payer: Medicare PPO | Admitting: Cardiovascular Disease

## 2013-12-06 VITALS — BP 167/68 | HR 52 | Ht 61.5 in | Wt 177.0 lb

## 2013-12-06 DIAGNOSIS — Z9861 Coronary angioplasty status: Secondary | ICD-10-CM

## 2013-12-06 DIAGNOSIS — I10 Essential (primary) hypertension: Secondary | ICD-10-CM

## 2013-12-06 DIAGNOSIS — Z955 Presence of coronary angioplasty implant and graft: Secondary | ICD-10-CM

## 2013-12-06 DIAGNOSIS — I252 Old myocardial infarction: Secondary | ICD-10-CM

## 2013-12-06 DIAGNOSIS — E785 Hyperlipidemia, unspecified: Secondary | ICD-10-CM

## 2013-12-06 DIAGNOSIS — I2510 Atherosclerotic heart disease of native coronary artery without angina pectoris: Secondary | ICD-10-CM

## 2013-12-06 NOTE — Patient Instructions (Addendum)
Your physician wants you to follow-up in: 6 months  You will receive a reminder letter in the mail two months in advance. If you don't receive a letter, please call our office to schedule the follow-up appointment.  Your physician recommends that you continue on your current medications as directed. Please refer to the Current Medication list given to you today.  

## 2013-12-06 NOTE — Progress Notes (Signed)
Patient ID: ALLETA AVERY, female   DOB: [birth year], 68 y.o.   MRN: [id number]       CARDIOLOGY CONSULT NOTE  Patient ID: BRIANE BIRDEN MRN: [id number] DOB/AGE: 04/01/1946 68 y.o.  Admit date: (Not on file) Primary Physician Glo Herring., MD  Reason for Consultation: CAD, prior PCI  HPI: The patient is a 68 year old woman with a past medical history significant for coronary artery disease with a prior history of myocardial infarction, hyperlipidemia with previously elevated transaminases, hypertension and diabetes. She underwent percutaneous coronary intervention for acute coronary syndrome on 09/11/2007 with a bare-metal stent placed in an occluded left circumflex coronary artery. That catheterization report mentions scattered sequential 80% lesions in the LAD. She was also noted to have a 60% lesion in the proximal RCA. An echocardiogram performed in August 2013 revealed normal left ventricular systolic function with an ejection fraction of 60-65%, moderate left ventricular hypertrophy and grade 1 diastolic dysfunction. A lipid panel on 05/11/2013 revealed a total cholesterol of 139, LDL 66, HDL 43, and triglycerides of 150. She is doing well and denies chest pain, shortness of breath, dizziness, lightheadedness, palpitations, leg swelling and syncope. She is getting over a cold. She said that her blood pressure is normally well controlled. She believes her stress test performed in 2013 was normal.    No Known Allergies  Current Outpatient Prescriptions  Medication Sig Dispense Refill  . aspirin EC 81 MG tablet Take 81 mg by mouth every morning.      . clopidogrel (PLAVIX) 75 MG tablet Take 1 tablet (75 mg total) by mouth every morning.  90 tablet  0  . glipiZIDE (GLUCOTROL XL) 5 MG 24 hr tablet Take 5 mg by mouth every morning.       . hyoscyamine (LEVSIN SL) 0.125 MG SL tablet Place 0.125 mg under the tongue every 4 (four) hours as needed.      . metFORMIN (GLUCOPHAGE) 500 MG tablet  Take 500 mg by mouth 2 (two) times daily with a meal.      . metoprolol (LOPRESSOR) 50 MG tablet TAKE 1/2 TABLET TWICE DAILY  60 tablet  6  . ramipril (ALTACE) 10 MG tablet Take 10 mg by mouth 2 (two) times daily.       . rosuvastatin (CRESTOR) 10 MG tablet Take 1 tablet (10 mg total) by mouth at bedtime.  30 tablet  6  . triamterene-hydrochlorothiazide (MAXZIDE-25) 37.5-25 MG per tablet Take 1 tablet by mouth every other day.       No current facility-administered medications for this visit.    Past Medical History  Diagnosis Date  . Hypertension   . CAD (coronary artery disease)   . Diabetes mellitus     type 2 x  1 or 2 yrs  . Seasonal allergies   . Fatty liver   . GERD (gastroesophageal reflux disease)     Past Surgical History  Procedure Laterality Date  . Left breast lumpectomy    . Partial hysterectomy    . Cholecystectomy  2008  . Coronary angioplasty with stent placement    . Colonoscopy  06/17/2012    Procedure: COLONOSCOPY;  Surgeon: Rogene Houston, MD;  Location: [facility];  Service: Endoscopy;  Laterality: N/A;  100    History   Social History  . Marital Status: Single    Spouse Name: N/A    Number of Children: N/A  . Years of Education: N/A   Occupational History  . Not on file.  Social History Main Topics  . Smoking status: Never Smoker   . Smokeless tobacco: Never Used  . Alcohol Use: No  . Drug Use: No  . Sexual Activity: Not on file   Other Topics Concern  . Not on file   Social History Narrative  . No narrative on file     No family history on file.   Prior to Admission medications   Medication Sig Start Date End Date Taking? Authorizing Provider  aspirin EC 81 MG tablet Take 81 mg by mouth every morning.   Yes Historical Provider, MD  clopidogrel (PLAVIX) 75 MG tablet Take 1 tablet (75 mg total) by mouth every morning. 11/18/13  Yes Pixie Casino, MD  glipiZIDE (GLUCOTROL XL) 5 MG 24 hr tablet Take 5 mg by mouth every morning.     Yes Historical Provider, MD  hyoscyamine (LEVSIN SL) 0.125 MG SL tablet Place 0.125 mg under the tongue every 4 (four) hours as needed.   Yes Historical Provider, MD  metFORMIN (GLUCOPHAGE) 500 MG tablet Take 500 mg by mouth 2 (two) times daily with a meal.   Yes Historical Provider, MD  metoprolol (LOPRESSOR) 50 MG tablet TAKE 1/2 TABLET TWICE DAILY 09/20/13  Yes Pixie Casino, MD  ramipril (ALTACE) 10 MG tablet Take 10 mg by mouth 2 (two) times daily.    Yes Historical Provider, MD  rosuvastatin (CRESTOR) 10 MG tablet Take 1 tablet (10 mg total) by mouth at bedtime. 11/11/13  Yes Lorretta Harp, MD  triamterene-hydrochlorothiazide (MAXZIDE-25) 37.5-25 MG per tablet Take 1 tablet by mouth every other day.   Yes Historical Provider, MD     Review of systems complete and found to be negative unless listed above in HPI     Physical exam Blood pressure 167/68, pulse 52, height 5' 1.5" (1.562 m), weight 177 lb (80.287 kg). General: NAD Neck: No JVD, no thyromegaly or thyroid nodule.  Lungs: Clear to auscultation bilaterally with normal respiratory effort. CV: Nondisplaced PMI.  Heart regular S1/S2, no S3/S4, no murmur.  No peripheral edema.  No carotid bruit.  Normal pedal pulses.  Abdomen: Soft, nontender, no hepatosplenomegaly, no distention.  Skin: Intact without lesions or rashes.  Neurologic: Alert and oriented x 3.  Psych: Normal affect. Extremities: No clubbing or cyanosis.  HEENT: Normal.   Labs:   Lab Results  Component Value Date   WBC 6.7 05/11/2013   HGB 13.3 05/11/2013   HCT 39.9 05/11/2013   MCV 84.4 05/11/2013   PLT 210 05/11/2013   No results found for this basename: NA, K, CL, CO2, BUN, CREATININE, CALCIUM, LABALBU, PROT, BILITOT, ALKPHOS, ALT, AST, GLUCOSE,  in the last 168 hours Lab Results  Component Value Date   CKTOTAL 320* 09/13/2007   CKMB 6.3* 09/13/2007   TROPONINI  Value: 2.11        POSSIBLE MYOCARDIAL ISCHEMIA. SERIAL TESTING RECOMMENDED. CRITICAL  VALUE NOTED.  VALUE IS CONSISTENT WITH PREVIOUSLY REPORTED AND CALLED VALUE.* 09/13/2007    Lab Results  Component Value Date   CHOL 139 05/11/2013   CHOL  Value: 200        ATP III CLASSIFICATION:  <200     mg/dL   Desirable  200-239  mg/dL   Borderline High  >=240    mg/dL   High 09/12/2007   Lab Results  Component Value Date   HDL 43 05/11/2013   HDL 32* 09/12/2007   Lab Results  Component Value Date   LDLCALC 66 05/11/2013  [place]  Value: 143        Total Cholesterol/HDL:CHD Risk Coronary Heart Disease Risk Table                     Men   Women  1/2 Average Risk   3.4   3.3* 09/12/2007   Lab Results  Component Value Date   TRIG 150* 05/11/2013   TRIG 125 09/12/2007   Lab Results  Component Value Date   CHOLHDL 3.2 05/11/2013   CHOLHDL 6.3 09/12/2007   No results found for this basename: LDLDIRECT       EKG:  Studies: See HPI  ASSESSMENT AND PLAN:  1. CAD: symptomatically stable. Will obtain results of any stress testing performed in 2013, as Dr. Lowella Fairy note makes mention of one being ordered and the patient remembers having it done. Given her significant LAD lesions, and since she's not experiencing any bleeding problems, I plan to continue ASA, Plavix, metoprolol, and Crestor. 2. HTN: uncontrolled today, but is reportedly otherwise normal. Will monitor and not make any adjustments today. 3. Hyperlipidemia: results as noted above. Continue Crestor 10 mg daily. AST 24, ALT 22 in 05/2013. Will plan to repeat both lipids and LFT's in 6 months.  Dispo: f/u 6 months.  Signed: Kate Sable, M.D., F.A.C.C.  12/06/2013, 9:22 AM

## 2013-12-07 ENCOUNTER — Encounter: Payer: Self-pay | Admitting: Cardiovascular Disease

## 2013-12-19 ENCOUNTER — Other Ambulatory Visit: Payer: Self-pay | Admitting: Internal Medicine

## 2014-02-14 ENCOUNTER — Other Ambulatory Visit (INDEPENDENT_AMBULATORY_CARE_PROVIDER_SITE_OTHER): Payer: Self-pay | Admitting: Internal Medicine

## 2014-02-14 DIAGNOSIS — K589 Irritable bowel syndrome without diarrhea: Secondary | ICD-10-CM

## 2014-02-21 ENCOUNTER — Other Ambulatory Visit (HOSPITAL_COMMUNITY): Payer: Self-pay | Admitting: Internal Medicine

## 2014-02-21 DIAGNOSIS — Z139 Encounter for screening, unspecified: Secondary | ICD-10-CM

## 2014-02-22 ENCOUNTER — Ambulatory Visit (HOSPITAL_COMMUNITY)
Admission: RE | Admit: 2014-02-22 | Discharge: 2014-02-22 | Disposition: A | Discharge status: Home / Self Care | Payer: Medicare PPO | Source: Ambulatory Visit | Attending: Internal Medicine | Admitting: Internal Medicine

## 2014-02-22 DIAGNOSIS — Z1231 Encounter for screening mammogram for malignant neoplasm of breast: Secondary | ICD-10-CM | POA: Insufficient documentation

## 2014-02-22 DIAGNOSIS — Z139 Encounter for screening, unspecified: Secondary | ICD-10-CM

## 2014-06-13 ENCOUNTER — Ambulatory Visit (INDEPENDENT_AMBULATORY_CARE_PROVIDER_SITE_OTHER): Payer: Medicare PPO | Admitting: Cardiovascular Disease

## 2014-06-13 ENCOUNTER — Encounter: Payer: Self-pay | Admitting: Cardiovascular Disease

## 2014-06-13 VITALS — BP 160/92 | HR 56 | Ht 60.0 in | Wt 180.0 lb

## 2014-06-13 DIAGNOSIS — I252 Old myocardial infarction: Secondary | ICD-10-CM

## 2014-06-13 DIAGNOSIS — I10 Essential (primary) hypertension: Secondary | ICD-10-CM

## 2014-06-13 DIAGNOSIS — E785 Hyperlipidemia, unspecified: Secondary | ICD-10-CM

## 2014-06-13 DIAGNOSIS — Z9861 Coronary angioplasty status: Secondary | ICD-10-CM

## 2014-06-13 DIAGNOSIS — Z955 Presence of coronary angioplasty implant and graft: Secondary | ICD-10-CM

## 2014-06-13 DIAGNOSIS — I2510 Atherosclerotic heart disease of native coronary artery without angina pectoris: Secondary | ICD-10-CM

## 2014-06-13 MED ORDER — RAMIPRIL 10 MG PO CAPS: 10.0000 mg | ORAL_CAPSULE | Freq: Two times a day (BID) | ORAL | Status: DC

## 2014-06-13 NOTE — Patient Instructions (Signed)
Your physician wants you to follow-up in: 6 months You will receive a reminder letter in the mail two months in advance. If you don't receive a letter, please call our office to schedule the follow-up appointment.   Your physician has recommended you make the following change in your medication:     INCREASE  Ramipril to 10 mg twice a day     Thank you for choosing [facility] !

## 2014-06-13 NOTE — Progress Notes (Signed)
Patient ID: [name], female   DOB: 05/31/1946, 68 y.o.   MRN: [id number]      SUBJECTIVE: The patient is a 68 year old woman with a past medical history significant for coronary artery disease with a prior history of myocardial infarction, hyperlipidemia with previously elevated transaminases, hypertension and diabetes. She underwent percutaneous coronary intervention for acute coronary syndrome on 09/11/2007 with a bare-metal stent placed in an occluded left circumflex coronary artery. That catheterization report mentions scattered sequential 80% lesions in the LAD. She was also noted to have a 60% lesion in the proximal RCA. An echocardiogram performed in August 2013 revealed normal left ventricular systolic function with an ejection fraction of 60-65%, moderate left ventricular hypertrophy and grade 1 diastolic dysfunction.  The patient denies any symptoms of chest pain, palpitations, shortness of breath, lightheadedness, dizziness, leg swelling, orthopnea, PND, and syncope. She has some left thigh numbness at times and some left knee arthritis. 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   No Known Allergies  Current Outpatient Prescriptions  Medication Sig Dispense Refill  . aspirin EC 81 MG tablet Take 81 mg by mouth every morning.      . clopidogrel (PLAVIX) 75 MG tablet Take 1 tablet (75 mg total) by mouth every morning.  90 tablet  0  . glipiZIDE (GLUCOTROL XL) 5 MG 24 hr tablet Take 5 mg by mouth every morning.       . metFORMIN (GLUCOPHAGE) 500 MG tablet Take 500 mg by mouth 2 (two) times daily with a meal.      . metoprolol (LOPRESSOR) 50 MG tablet TAKE 1/2 TABLET TWICE DAILY  30 tablet  6  . OSCIMIN 0.125 MG SL tablet PLACE 1 TABLET UNDER TONGUE EVERY 4 HOURS AS NEEDED  120 tablet  4  . ramipril (ALTACE) 10 MG tablet Take 10 mg by mouth daily.       . rosuvastatin (CRESTOR) 10 MG tablet Take 1  tablet (10 mg total) by mouth at bedtime.  30 tablet  6   No current facility-administered medications for this visit.    Past Medical History  Diagnosis Date  . Hypertension   . CAD (coronary artery disease)   . Diabetes mellitus     type 2 x  1 or 2 yrs  . Seasonal allergies   . Fatty liver   . GERD (gastroesophageal reflux disease)     Past Surgical History  Procedure Laterality Date  . Left breast lumpectomy    . Partial hysterectomy    . Cholecystectomy  2008  . Coronary angioplasty with stent placement    . Colonoscopy  06/17/2012    Procedure: COLONOSCOPY;  Surgeon: Rogene Houston, MD;  Location: [facility];  Service: Endoscopy;  Laterality: N/A;  100    History   Social History  . Marital Status: Single    Spouse Name: N/A    Number of Children: N/A  . Years of Education: N/A   Occupational History  . Not on file.   Social History Main Topics  . Smoking status: Never Smoker   . Smokeless tobacco: Never Used  . Alcohol Use: No  . Drug Use: No  . Sexual Activity: Not on file   Other Topics Concern  . Not on file   Social History Narrative  . No narrative on file     Filed Vitals:   06/13/14 0813  BP: 160/92  Pulse: 56  Height: 5' (1.524 m)  Weight: 180 lb (81.647 kg)  SpO2: 98%    PHYSICAL EXAM General: NAD Neck: No JVD, no thyromegaly. Lungs: Clear to auscultation bilaterally with normal respiratory effort. CV: Nondisplaced PMI.  Regular rate and rhythm, normal S1/S2, no S3/S4, no murmur. No pretibial or periankle edema.  No carotid bruit.  Normal pedal pulses.  Abdomen: Soft, nontender, no hepatosplenomegaly, no distention.  Neurologic: Alert and oriented x 3.  Psych: Normal affect. Extremities: No clubbing or cyanosis.   ECG: reviewed and available in electronic records.      ASSESSMENT AND PLAN: 1. CAD: Symptomatically stable. Given her significant LAD lesions, and since she's not experiencing any bleeding problems, I plan to  continue ASA, Plavix, metoprolol, and Crestor.  2. HTN: Uncontrolled today. Will increase ramipril to 10 mg twice daily. 3. Hyperlipidemia: Will obtain results of both lipids and LFT's from PCP's office.   Dispo: f/u 6 months.   Kate Sable, M.D., F.A.C.C.

## 2014-06-14 ENCOUNTER — Encounter: Payer: Self-pay | Admitting: Cardiovascular Disease

## 2014-09-07 ENCOUNTER — Encounter (INDEPENDENT_AMBULATORY_CARE_PROVIDER_SITE_OTHER): Payer: Self-pay | Admitting: *Deleted

## 2014-09-08 ENCOUNTER — Other Ambulatory Visit: Payer: Self-pay | Admitting: Cardiovascular Disease

## 2014-10-11 ENCOUNTER — Other Ambulatory Visit: Payer: Self-pay | Admitting: Internal Medicine

## 2014-11-15 ENCOUNTER — Ambulatory Visit (INDEPENDENT_AMBULATORY_CARE_PROVIDER_SITE_OTHER): Payer: Medicare PPO | Admitting: Internal Medicine

## 2014-11-15 ENCOUNTER — Encounter (INDEPENDENT_AMBULATORY_CARE_PROVIDER_SITE_OTHER): Payer: Self-pay | Admitting: Internal Medicine

## 2014-11-15 VITALS — BP 122/50 | HR 60 | Temp 97.8°F | Ht 60.5 in | Wt 179.1 lb

## 2014-11-15 DIAGNOSIS — G8929 Other chronic pain: Secondary | ICD-10-CM

## 2014-11-15 DIAGNOSIS — R1010 Upper abdominal pain, unspecified: Secondary | ICD-10-CM

## 2014-11-15 DIAGNOSIS — R1011 Right upper quadrant pain: Principal | ICD-10-CM

## 2014-11-15 NOTE — Progress Notes (Signed)
   Subjective:    Patient ID: [name], female    DOB: Oct 12, 1946, 68 y.o.   MRN: [id number]  HPI Here for 1 yr follow up. Last seen in December of last year. She was having pain under her rt rib case as well as RLQ pain. She has had the pain for many years. She had the pain even before in GB was removed in 2003 She underwent a CT  Which revealed no acute findings. Today she presents today with c/o rt upper quadrant pain. She says it feels like something is sticking her.  The pain occurs everyday. After she has a BM she will have the pai.  The pain does not increase with lifting. Her back hurt worse when she is lifting.  CT last year for same was normal.  She also tells me she had pain to radiate down in her rt leg earlier this year and she fell. Appetite is okay . She has lost about 4 pounds since her last visit. She usually has a BM daily. No melena or BRRB.  Hx of fatty liver. Transaminases are normal.   06/01/2012 US abdomen:   Other: No free fluid  IMPRESSION: Probable fatty infiltration of liver.   02/08/2014 AST 25, ALP 79, ALT 22, H and H 13.0 and 38.5   CMP Latest Ref Rng 11/13/2013 05/11/2013 07/01/2012  Glucose 70 - 99 mg/dL 106(H) 228(H) -  BUN 6 - 23 mg/dL 14 13 -  Creatinine 0.50 - 1.10 mg/dL 0.78 0.86 -  Sodium 135 - 145 mEq/L 138 137 -  Potassium 3.5 - 5.3 mEq/L 4.2 4.2 -  Chloride 96 - 112 mEq/L 103 104 -  CO2 19 - 32 mEq/L 27 26 -  Calcium 8.4 - 10.5 mg/dL 9.2 9.3 -  Total Protein 6.0 - 8.3 g/dL 7.2 7.1 7.4  Total Bilirubin 0.3 - 1.2 mg/dL 0.4 0.6 0.5  Alkaline Phos 39 - 117 U/L 83 101 87  AST 0 - 37 U/L 28 24 49(H)  ALT 0 - 35 U/L 27 22 41(H)         06/17/2012 Colonoscopy for rt sided abdominal pain:Impression:  Normal terminal ileum.  Few diverticula at sigmoid colon otherwise normal colonoscopy.  She may have IBS with right-sided pain.       Review of Systems Single. One daughter in good health. Retired.    Objective:   Physical Exam    Filed Vitals:   11/15/14 1049  Height: 5' 0.5" (1.537 m)  Weight: 179 lb 1.6 oz (81.239 kg)   Alert and oriented. Skin warm and dry. Oral mucosa is moist.   . Sclera anicteric, conjunctivae is pink. Thyroid not enlarged. No cervical lymphadenopathy. Lungs clear. Heart regular rate and rhythm.  Abdomen is soft. Bowel sounds are positive. No hepatomegaly. No abdominal masses felt. Sligh tenderness under rt ribs.        Assessment & Plan:  Rt upper quadrant pain off an on for many years (chronic). Hepatic function today to be sure she does not have a CBD stone. OV in 6 months.  Hx of fatty liver. Transaminases have been normal.

## 2014-11-15 NOTE — Patient Instructions (Signed)
Hepatic function

## 2014-11-17 LAB — HEPATIC FUNCTION PANEL
ALT: 30 U/L (ref 0–35)
AST: 30 U/L (ref 0–37)
Albumin: 4.1 g/dL (ref 3.5–5.2)
Alkaline Phosphatase: 89 U/L (ref 39–117)
BILIRUBIN DIRECT: 0.1 mg/dL (ref 0.0–0.3)
Indirect Bilirubin: 0.5 mg/dL (ref 0.2–1.2)
TOTAL PROTEIN: 7 g/dL (ref 6.0–8.3)
Total Bilirubin: 0.6 mg/dL (ref 0.2–1.2)

## 2014-12-27 ENCOUNTER — Encounter: Payer: Self-pay | Admitting: *Deleted

## 2014-12-27 ENCOUNTER — Ambulatory Visit: Payer: Medicare PPO | Admitting: Cardiovascular Disease

## 2015-01-13 DIAGNOSIS — E1165 Type 2 diabetes mellitus with hyperglycemia: Secondary | ICD-10-CM | POA: Diagnosis not present

## 2015-01-13 DIAGNOSIS — Z681 Body mass index (BMI) 19 or less, adult: Secondary | ICD-10-CM | POA: Diagnosis not present

## 2015-03-22 ENCOUNTER — Encounter (INDEPENDENT_AMBULATORY_CARE_PROVIDER_SITE_OTHER): Payer: Self-pay | Admitting: *Deleted

## 2015-04-13 DIAGNOSIS — E1165 Type 2 diabetes mellitus with hyperglycemia: Secondary | ICD-10-CM | POA: Diagnosis not present

## 2015-04-13 DIAGNOSIS — E6609 Other obesity due to excess calories: Secondary | ICD-10-CM | POA: Diagnosis not present

## 2015-04-13 DIAGNOSIS — I10 Essential (primary) hypertension: Secondary | ICD-10-CM | POA: Diagnosis not present

## 2015-04-13 DIAGNOSIS — Z6833 Body mass index (BMI) 33.0-33.9, adult: Secondary | ICD-10-CM | POA: Diagnosis not present

## 2015-05-17 ENCOUNTER — Ambulatory Visit (INDEPENDENT_AMBULATORY_CARE_PROVIDER_SITE_OTHER): Payer: Commercial Managed Care - HMO | Admitting: Internal Medicine

## 2015-05-17 ENCOUNTER — Encounter (INDEPENDENT_AMBULATORY_CARE_PROVIDER_SITE_OTHER): Payer: Self-pay | Admitting: Internal Medicine

## 2015-05-17 VITALS — BP 110/60 | HR 64 | Temp 98.7°F | Ht 61.5 in | Wt 177.6 lb

## 2015-05-17 DIAGNOSIS — R1011 Right upper quadrant pain: Secondary | ICD-10-CM | POA: Diagnosis not present

## 2015-05-17 LAB — HEPATIC FUNCTION PANEL
ALK PHOS: 80 U/L (ref 39–117)
ALT: 36 U/L — ABNORMAL HIGH (ref 0–35)
AST: 41 U/L — AB (ref 0–37)
Albumin: 4.3 g/dL (ref 3.5–5.2)
BILIRUBIN TOTAL: 0.6 mg/dL (ref 0.2–1.2)
Bilirubin, Direct: 0.1 mg/dL (ref 0.0–0.3)
Indirect Bilirubin: 0.5 mg/dL (ref 0.2–1.2)
TOTAL PROTEIN: 7.2 g/dL (ref 6.0–8.3)

## 2015-05-17 NOTE — Progress Notes (Signed)
Subjective:    Patient ID: [name], female    DOB: 02-20-1946, 69 y.o.   MRN: [id number]  HPI Here today for f/u. She tells me she is doing okay. She has a pain in her rt upper quadrant occasionally. The pain comes and goes. She has hx of same. She has had this pain off and on for years. She has been evaluated in the past for this vague pain.  Feels like something is sticking her. CT in 2014 was normal. Hx of cholecystectomy.  Appetite is okay. No weight loss.  No abdominal pain . Usually has a BM daily. No melena or BRRB.  Hx of fatty liver. Transaminases are normal.   Colonoscopy in 2013: Dr. Laural Golden. Rt sided abdominal pain.  Impression:  Normal terminal ileum. Few diverticula at sigmoid colon otherwise normal colonoscopy. She may have IBS with right-sided pain. If she does not respond to low-dose and does cause moderate therapy will ask Dr. Gerarda Fraction discontinue metformin least for the time being.    Hepatic Function Panel     Component Value Date/Time   PROT 7.0 11/15/2014 0706   ALBUMIN 4.1 11/15/2014 0706   AST 30 11/15/2014 0706   ALT 30 11/15/2014 0706   ALKPHOS 89 11/15/2014 0706   BILITOT 0.6 11/15/2014 0706   BILIDIR 0.1 11/15/2014 0706   IBILI 0.5 11/15/2014 0706        Review of Systems Single. One child in good health.  Past Medical History  Diagnosis Date  . Hypertension   . CAD (coronary artery disease)   . Diabetes mellitus     type 2 x  1 or 2 yrs  . Seasonal allergies   . Fatty liver   . GERD (gastroesophageal reflux disease)     Past Surgical History  Procedure Laterality Date  . Left breast lumpectomy    . Partial hysterectomy    . Cholecystectomy  2008  . Coronary angioplasty with stent placement      2008  . Colonoscopy  06/17/2012    Procedure: COLONOSCOPY;  Surgeon: Rogene Houston, MD;  [facility];  Service: Endoscopy;  Laterality: N/A;  100    No Known Allergies  Current Outpatient Prescriptions on File Prior to  Visit  Medication Sig Dispense Refill  . aspirin EC 81 MG tablet Take 81 mg by mouth every morning.    . clopidogrel (PLAVIX) 75 MG tablet Take 1 tablet (75 mg total) by mouth every morning. 90 tablet 0  . CRESTOR 10 MG tablet TAKE ONE TABLET BY MOUTH EVERY NIGHT AT BEDTIME 30 tablet 6  . metoprolol (LOPRESSOR) 50 MG tablet TAKE 1/2 TABLET TWICE DAILY 30 tablet 6  . ramipril (ALTACE) 10 MG capsule Take 1 capsule (10 mg total) by mouth 2 (two) times daily. 180 capsule 3   No current facility-administered medications on file prior to visit.        Objective:   Physical Exam Blood pressure 110/60, pulse 64, temperature 98.7 F (37.1 C), height 5' 1.5" (1.562 m), weight 177 lb 9.6 oz (80.559 kg). Alert and oriented. Skin warm and dry. Oral mucosa is moist.   . Sclera anicteric, conjunctivae is pink. Thyroid not enlarged. No cervical lymphadenopathy. Lungs clear. Heart regular rate and rhythm.  Abdomen is soft. Bowel sounds are positive. No hepatomegaly. No abdominal masses felt. No tenderness.  No edema to lower extremities.         Assessment & Plan:  Rt upper quadrant pain  off and on for years. Past studies have been normal.  Will get a Hepatic function and [place]. OV in 1 year.

## 2015-05-17 NOTE — Patient Instructions (Signed)
Labs and xray. OV in 1 year.

## 2015-05-26 ENCOUNTER — Ambulatory Visit (HOSPITAL_COMMUNITY)
Admission: RE | Admit: 2015-05-26 | Discharge: 2015-05-26 | Disposition: A | Discharge status: Home / Self Care | Payer: Commercial Managed Care - HMO | Source: Ambulatory Visit | Attending: Internal Medicine | Admitting: Internal Medicine

## 2015-05-26 DIAGNOSIS — R932 Abnormal findings on diagnostic imaging of liver and biliary tract: Secondary | ICD-10-CM | POA: Diagnosis not present

## 2015-05-26 DIAGNOSIS — R1011 Right upper quadrant pain: Secondary | ICD-10-CM | POA: Diagnosis not present

## 2015-05-26 DIAGNOSIS — Z9049 Acquired absence of other specified parts of digestive tract: Secondary | ICD-10-CM | POA: Diagnosis not present

## 2015-06-11 ENCOUNTER — Other Ambulatory Visit: Payer: Self-pay | Admitting: Cardiovascular Disease

## 2015-07-06 ENCOUNTER — Telehealth (INDEPENDENT_AMBULATORY_CARE_PROVIDER_SITE_OTHER): Payer: Self-pay | Admitting: Internal Medicine

## 2015-07-06 DIAGNOSIS — R748 Abnormal levels of other serum enzymes: Secondary | ICD-10-CM | POA: Diagnosis not present

## 2015-07-06 NOTE — Telephone Encounter (Signed)
Needs to go to lab and get labs for elevated liver enzymes

## 2015-07-07 LAB — HEPATIC FUNCTION PANEL
ALK PHOS: 86 U/L (ref 33–130)
ALT: 40 U/L — ABNORMAL HIGH (ref 6–29)
AST: 46 U/L — ABNORMAL HIGH (ref 10–35)
Albumin: 3.9 g/dL (ref 3.6–5.1)
Bilirubin, Direct: 0.1 mg/dL (ref <=0.2)
Indirect Bilirubin: 0.4 mg/dL (ref 0.2–1.2)
TOTAL PROTEIN: 7 g/dL (ref 6.1–8.1)
Total Bilirubin: 0.5 mg/dL (ref 0.2–1.2)

## 2015-07-13 ENCOUNTER — Other Ambulatory Visit: Payer: Self-pay | Admitting: Cardiovascular Disease

## 2015-07-17 DIAGNOSIS — E1165 Type 2 diabetes mellitus with hyperglycemia: Secondary | ICD-10-CM | POA: Diagnosis not present

## 2015-07-17 DIAGNOSIS — E6609 Other obesity due to excess calories: Secondary | ICD-10-CM | POA: Diagnosis not present

## 2015-07-17 DIAGNOSIS — I10 Essential (primary) hypertension: Secondary | ICD-10-CM | POA: Diagnosis not present

## 2015-07-17 DIAGNOSIS — Z6832 Body mass index (BMI) 32.0-32.9, adult: Secondary | ICD-10-CM | POA: Diagnosis not present

## 2015-07-17 DIAGNOSIS — E782 Mixed hyperlipidemia: Secondary | ICD-10-CM | POA: Diagnosis not present

## 2015-07-17 DIAGNOSIS — Z1389 Encounter for screening for other disorder: Secondary | ICD-10-CM | POA: Diagnosis not present

## 2015-07-20 ENCOUNTER — Other Ambulatory Visit: Payer: Self-pay | Admitting: Cardiovascular Disease

## 2015-07-27 ENCOUNTER — Encounter: Payer: Self-pay | Admitting: Cardiovascular Disease

## 2015-07-28 ENCOUNTER — Encounter: Payer: Self-pay | Admitting: Cardiovascular Disease

## 2015-09-13 DIAGNOSIS — E1165 Type 2 diabetes mellitus with hyperglycemia: Secondary | ICD-10-CM | POA: Diagnosis not present

## 2015-09-13 DIAGNOSIS — E119 Type 2 diabetes mellitus without complications: Secondary | ICD-10-CM | POA: Diagnosis not present

## 2015-10-04 DIAGNOSIS — I10 Essential (primary) hypertension: Secondary | ICD-10-CM | POA: Diagnosis not present

## 2015-10-04 DIAGNOSIS — E782 Mixed hyperlipidemia: Secondary | ICD-10-CM | POA: Diagnosis not present

## 2015-10-04 DIAGNOSIS — Z1389 Encounter for screening for other disorder: Secondary | ICD-10-CM | POA: Diagnosis not present

## 2015-10-04 DIAGNOSIS — E1165 Type 2 diabetes mellitus with hyperglycemia: Secondary | ICD-10-CM | POA: Diagnosis not present

## 2015-10-18 DIAGNOSIS — Z1389 Encounter for screening for other disorder: Secondary | ICD-10-CM | POA: Diagnosis not present

## 2015-10-18 DIAGNOSIS — I2510 Atherosclerotic heart disease of native coronary artery without angina pectoris: Secondary | ICD-10-CM | POA: Diagnosis not present

## 2015-10-18 DIAGNOSIS — E119 Type 2 diabetes mellitus without complications: Secondary | ICD-10-CM | POA: Diagnosis not present

## 2015-10-18 DIAGNOSIS — I10 Essential (primary) hypertension: Secondary | ICD-10-CM | POA: Diagnosis not present

## 2015-10-18 DIAGNOSIS — Z6831 Body mass index (BMI) 31.0-31.9, adult: Secondary | ICD-10-CM | POA: Diagnosis not present

## 2015-11-16 ENCOUNTER — Ambulatory Visit (INDEPENDENT_AMBULATORY_CARE_PROVIDER_SITE_OTHER): Payer: Commercial Managed Care - HMO | Admitting: Internal Medicine

## 2015-11-16 ENCOUNTER — Encounter (INDEPENDENT_AMBULATORY_CARE_PROVIDER_SITE_OTHER): Payer: Self-pay | Admitting: *Deleted

## 2015-12-11 DIAGNOSIS — Z1389 Encounter for screening for other disorder: Secondary | ICD-10-CM | POA: Diagnosis not present

## 2015-12-11 DIAGNOSIS — J019 Acute sinusitis, unspecified: Secondary | ICD-10-CM | POA: Diagnosis not present

## 2015-12-11 DIAGNOSIS — E782 Mixed hyperlipidemia: Secondary | ICD-10-CM | POA: Diagnosis not present

## 2015-12-11 DIAGNOSIS — E6609 Other obesity due to excess calories: Secondary | ICD-10-CM | POA: Diagnosis not present

## 2015-12-11 DIAGNOSIS — I10 Essential (primary) hypertension: Secondary | ICD-10-CM | POA: Diagnosis not present

## 2015-12-11 DIAGNOSIS — J9801 Acute bronchospasm: Secondary | ICD-10-CM | POA: Diagnosis not present

## 2015-12-11 DIAGNOSIS — J209 Acute bronchitis, unspecified: Secondary | ICD-10-CM | POA: Diagnosis not present

## 2015-12-11 DIAGNOSIS — Z6831 Body mass index (BMI) 31.0-31.9, adult: Secondary | ICD-10-CM | POA: Diagnosis not present

## 2015-12-11 DIAGNOSIS — E119 Type 2 diabetes mellitus without complications: Secondary | ICD-10-CM | POA: Diagnosis not present

## 2016-01-25 DIAGNOSIS — Z6830 Body mass index (BMI) 30.0-30.9, adult: Secondary | ICD-10-CM | POA: Diagnosis not present

## 2016-01-25 DIAGNOSIS — Z0000 Encounter for general adult medical examination without abnormal findings: Secondary | ICD-10-CM | POA: Diagnosis not present

## 2016-01-25 DIAGNOSIS — Z1389 Encounter for screening for other disorder: Secondary | ICD-10-CM | POA: Diagnosis not present

## 2016-01-25 DIAGNOSIS — E119 Type 2 diabetes mellitus without complications: Secondary | ICD-10-CM | POA: Diagnosis not present

## 2016-02-02 DIAGNOSIS — Z1389 Encounter for screening for other disorder: Secondary | ICD-10-CM | POA: Diagnosis not present

## 2016-02-02 DIAGNOSIS — Z6831 Body mass index (BMI) 31.0-31.9, adult: Secondary | ICD-10-CM | POA: Diagnosis not present

## 2016-02-02 DIAGNOSIS — E6609 Other obesity due to excess calories: Secondary | ICD-10-CM | POA: Diagnosis not present

## 2016-02-02 DIAGNOSIS — E119 Type 2 diabetes mellitus without complications: Secondary | ICD-10-CM | POA: Diagnosis not present

## 2016-02-22 ENCOUNTER — Other Ambulatory Visit (HOSPITAL_COMMUNITY): Payer: Self-pay | Admitting: Physician Assistant

## 2016-02-22 DIAGNOSIS — Z1231 Encounter for screening mammogram for malignant neoplasm of breast: Secondary | ICD-10-CM

## 2016-02-26 ENCOUNTER — Encounter (INDEPENDENT_AMBULATORY_CARE_PROVIDER_SITE_OTHER): Payer: Self-pay | Admitting: Internal Medicine

## 2016-02-28 ENCOUNTER — Ambulatory Visit (HOSPITAL_COMMUNITY): Payer: Commercial Managed Care - HMO

## 2016-05-16 ENCOUNTER — Ambulatory Visit (INDEPENDENT_AMBULATORY_CARE_PROVIDER_SITE_OTHER): Payer: Commercial Managed Care - HMO | Admitting: Internal Medicine

## 2016-05-23 ENCOUNTER — Encounter (INDEPENDENT_AMBULATORY_CARE_PROVIDER_SITE_OTHER): Payer: Self-pay | Admitting: Internal Medicine

## 2016-05-23 ENCOUNTER — Ambulatory Visit (INDEPENDENT_AMBULATORY_CARE_PROVIDER_SITE_OTHER): Payer: Commercial Managed Care - HMO | Admitting: Internal Medicine

## 2016-05-23 VITALS — BP 132/60 | HR 60 | Temp 98.0°F | Ht 61.5 in | Wt 168.4 lb

## 2016-05-23 DIAGNOSIS — K219 Gastro-esophageal reflux disease without esophagitis: Secondary | ICD-10-CM

## 2016-05-23 DIAGNOSIS — R1011 Right upper quadrant pain: Secondary | ICD-10-CM

## 2016-05-23 LAB — HEPATIC FUNCTION PANEL
ALBUMIN: 4.1 g/dL (ref 3.6–5.1)
ALK PHOS: 84 U/L (ref 33–130)
ALT: 22 U/L (ref 6–29)
AST: 28 U/L (ref 10–35)
BILIRUBIN INDIRECT: 0.4 mg/dL (ref 0.2–1.2)
Bilirubin, Direct: 0.1 mg/dL (ref <=0.2)
TOTAL PROTEIN: 6.9 g/dL (ref 6.1–8.1)
Total Bilirubin: 0.5 mg/dL (ref 0.2–1.2)

## 2016-05-23 NOTE — Progress Notes (Addendum)
Subjective:    Patient ID: [name], female    DOB: 11-Mar-1946, 70 y.o.   MRN: [id number]  HPI Here today for f/u. She is doing okay.  Pain left knee from arthritis. 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  She occasionally has ruq pain. Hx of cholecystectomy. Appetite is okay. She has lost about 9 pounds since her lat visit in June. She has a BM daily. No melena or BRRB. No change in bowel habits. No acid reflux.  She exercises daily at home.   Hx of cardiac stent/CAD: maintained on Plavix    HPI Here today for f/u. She tells me she is doing okay. She has a pain in her rt upper quadrant occasionally. The pain comes and goes. She has hx of same. She has had this pain off and on for years. She has been evaluated in the past for this vague pain.  Feels like something is sticking her. CT in 2014 was normal. Hx of cholecystectomy.  Appetite is okay. No weight loss. No abdominal pain . Usually has a BM daily. No melena or BRRB.  Hx of fatty liver. Transaminases are normal.  Hepatic Function Latest Ref Rng 07/06/2015 05/17/2015 11/15/2014  Total Protein 6.1 - 8.1 g/dL 7.0 7.2 7.0  Albumin 3.6 - 5.1 g/dL 3.9 4.3 4.1  AST 10 - 35 U/L 46(H) 41(H) 30  ALT 6 - 29 U/L 40(H) 36(H) 30  Alk Phosphatase 33 - 130 U/L 86 80 89  Total Bilirubin 0.2 - 1.2 mg/dL 0.5 0.6 0.6  Bilirubin, Direct <=0.2 mg/dL 0.1 0.1 0.1      05/26/2015 US abdomen:   IMPRESSION: Post cholecystectomy.  Probable fatty infiltration of liver as above.  IVC obscured.  No acute abnormalities.   Colonoscopy in 2013: Dr. Laural Golden. Rt sided abdominal pain. Impression:  Normal terminal ileum. Few diverticula at sigmoid colon otherwise normal colonoscopy. She may have IBS with right-sided pain. If she does not respond to low-dose and does cause moderate therapy will ask Dr. Gerarda Fraction discontinue metformin least for the time being.  Review of Systems     Past Medical History  Diagnosis Date  .  Hypertension   . CAD (coronary artery disease)   . Diabetes mellitus ([place])     type 2 x  1 or 2 yrs  . Seasonal allergies   . Fatty liver   . GERD (gastroesophageal reflux disease)     Past Surgical History  Procedure Laterality Date  . Left breast lumpectomy    . Partial hysterectomy    . Cholecystectomy  2008  . Colonoscopy  06/17/2012    Procedure: COLONOSCOPY;  Surgeon: Rogene Houston, MD;  [facility];  Service: Endoscopy;  Laterality: N/A;  100  . Doppler echocardiography  07/20/2012    EF 60-65%, wall motion normal, there were no regional wall motion abnormalities  . Cardiac catheterization  09/11/2007    100% occlusion of the left circumflex. proceed with stenting  . Cardiac catheterization  09/11/2007    mid circumflex occlusion with tandem 80% proximal LAD lesion to the 50% mid LAD lesion. nonDES stenting of the circumflex occlusion after recanalization and balloon dilatation, the stent was a 2.75x26m Liberte, TIMI3 flow was restored  . Cardiovascular stress test  03/24/2012    normal pattern of perfusion in all regions, no scintigraphic evidence of inducible myocardial ischemia  . Lower extremity venous doppler Left 12/27/2011    no evidence of deep vein thrombosis  involving the left lower extremity and right common femoral vein, enlarged inguinal lymph node is visualized on the left. no baker's cyst on the left.    No Known Allergies  Current Outpatient Prescriptions on File Prior to Visit  Medication Sig Dispense Refill  . aspirin EC 81 MG tablet Take 81 mg by mouth every morning.    . clopidogrel (PLAVIX) 75 MG tablet Take 1 tablet (75 mg total) by mouth every morning. 90 tablet 0  . ramipril (ALTACE) 10 MG capsule Take 1 capsule (10 mg total) by mouth 2 (two) times daily. 180 capsule 3  . SitaGLIPtin-MetFORMIN HCl (JANUMET XR) [phone] MG TB24 Take by mouth.    . metoprolol (LOPRESSOR) 50 MG tablet TAKE ONE-HALF TABLET BY MOUTH TWICE DAILY 15 tablet 0    No current facility-administered medications on file prior to visit.     Objective:   Physical Exam Blood pressure 132/60, pulse 60, temperature 98 F (36.7 C), height 5' 1.5" (1.562 m), weight 168 lb 6.4 oz (76.386 kg). Alert and oriented. Skin warm and dry. Oral mucosa is moist.   . Sclera anicteric, conjunctivae is pink. Thyroid not enlarged. No cervical lymphadenopathy. Lungs clear. Heart regular rate and rhythm.  Abdomen is soft. Bowel sounds are positive. No hepatomegaly. No abdominal masses felt. No tenderness.  No edema to lower extremities.         Assessment & Plan:  Occasionally rt upper quadrant pain. [place] last year normal.  Hx of cholecystectomy,  Hx ofelevated liver enzymes. Will repeat Hepatic function today. OV in 1 year.

## 2016-05-23 NOTE — Patient Instructions (Signed)
Hepatic function.  OV in 1 year. 

## 2016-06-20 DIAGNOSIS — Z6830 Body mass index (BMI) 30.0-30.9, adult: Secondary | ICD-10-CM | POA: Diagnosis not present

## 2016-06-20 DIAGNOSIS — R1011 Right upper quadrant pain: Secondary | ICD-10-CM | POA: Diagnosis not present

## 2016-06-20 DIAGNOSIS — N950 Postmenopausal bleeding: Secondary | ICD-10-CM | POA: Diagnosis not present

## 2016-07-09 ENCOUNTER — Encounter: Payer: Self-pay | Admitting: *Deleted

## 2016-07-18 ENCOUNTER — Encounter: Payer: Self-pay | Admitting: Obstetrics and Gynecology

## 2016-07-18 ENCOUNTER — Ambulatory Visit (INDEPENDENT_AMBULATORY_CARE_PROVIDER_SITE_OTHER): Payer: Commercial Managed Care - HMO | Admitting: Obstetrics and Gynecology

## 2016-07-18 VITALS — BP 140/94 | Ht 60.5 in | Wt 165.0 lb

## 2016-07-18 DIAGNOSIS — Z90711 Acquired absence of uterus with remaining cervical stump: Secondary | ICD-10-CM

## 2016-07-18 DIAGNOSIS — N952 Postmenopausal atrophic vaginitis: Secondary | ICD-10-CM | POA: Diagnosis not present

## 2016-07-18 DIAGNOSIS — N939 Abnormal uterine and vaginal bleeding, unspecified: Secondary | ICD-10-CM | POA: Diagnosis not present

## 2016-07-18 MED ORDER — ESTRADIOL 0.1 MG/GM VA CREA: 0.2500 | TOPICAL_CREAM | VAGINAL | 3 refills | Status: DC

## 2016-07-18 NOTE — Progress Notes (Signed)
[facility] Visit  07/18/16           Patient name: [name] MRN [id number]  Date of birth: March 30, 1946  CC & HPI:  [name] is a 70 y.o. female presenting today for intermittent vaginal bleeding s/p hysterectomy onset 1 year. Pt denies blood clots and notes that her vaginal bleeding is light pinkish streaks. Pt notes that she had her hysterectomy "awhile ago" and she can't remember when she had the surgery. Pt is having associated symptoms of lower abdominal pain. Pt has not tried any medicaitons for the relief of her symptoms. Pt denies unexpected weight loss, abdominal distension, fever, chills, and any other symptoms.   ROS:  Review of Systems  Constitutional: Negative for chills, fever and weight loss.  Gastrointestinal: Positive for abdominal pain (lower).       -Abdominal distension  Genitourinary:       +Vaginal bleeding    Pertinent History Reviewed:   Reviewed: Significant for HTN, DM, CAD Medical         Past Medical History:  Diagnosis Date  . CAD (coronary artery disease)   . Diabetes mellitus ([place])    type 2 x  1 or 2 yrs  . Fatty liver   . GERD (gastroesophageal reflux disease)   . Hypertension   . Seasonal allergies                               Surgical Hx:    Past Surgical History:  Procedure Laterality Date  . CARDIAC CATHETERIZATION  09/11/2007   100% occlusion of the left circumflex. proceed with stenting  . CARDIAC CATHETERIZATION  09/11/2007   mid circumflex occlusion with tandem 80% proximal LAD lesion to the 50% mid LAD lesion. nonDES stenting of the circumflex occlusion after recanalization and balloon dilatation, the stent was a 2.75x44mm Liberte, TIMI3 flow was restored  . CARDIOVASCULAR STRESS TEST  03/24/2012   normal pattern of perfusion in all regions, no scintigraphic evidence of inducible myocardial ischemia  . CHOLECYSTECTOMY  2008  . COLONOSCOPY  06/17/2012   Procedure: COLONOSCOPY;  Surgeon: Rogene Houston, MD;   [facility];  Service: Endoscopy;  Laterality: N/A;  100  . DOPPLER ECHOCARDIOGRAPHY  07/20/2012   EF 60-65%, wall motion normal, there were no regional wall motion abnormalities  . Left breast lumpectomy    . LOWER EXTREMITY VENOUS DOPPLER Left 12/27/2011   no evidence of deep vein thrombosis involving the left lower extremity and right common femoral vein, enlarged inguinal lymph node is visualized on the left. no baker's cyst on the left.  Marland Kitchen PARTIAL HYSTERECTOMY     Medications: Reviewed & Updated - see associated section                       Current Outpatient Prescriptions:  .  aspirin EC 81 MG tablet, Take 81 mg by mouth every morning., Disp: , Rfl:  .  clopidogrel (PLAVIX) 75 MG tablet, Take 1 tablet (75 mg total) by mouth every morning., Disp: 90 tablet, Rfl: 0 .  ramipril (ALTACE) 10 MG capsule, Take 1 capsule (10 mg total) by mouth 2 (two) times daily., Disp: 180 capsule, Rfl: 3 .  SitaGLIPtin-MetFORMIN HCl (JANUMET XR) [phone] MG TB24, Take by mouth., Disp: , Rfl:    Social History: Reviewed -  reports that she has never smoked. She has never used  smokeless tobacco.  Objective Findings:  Vitals: Blood pressure (!) 140/94, height 5' 0.5" (1.537 m), weight 165 lb (74.8 kg).  Physical Examination: General appearance - alert, well appearing, and in no distress and oriented to person, place, and time Mental status - alert, oriented to person, place, and time, normal mood, behavior, speech, dress, motor activity, and thought processes Physical Examination: Abdomen - soft, nontender, nondistended, no masses or organomegaly Pelvic - normal external genitalia, vulva, vagina, cervix, uterus and adnexa, examination not indicated, VAGINA: no lesions, atrophic tissues, vaginal cuff well healed and supported. Mild irritation to top of vaginal small red dots noted.  WET MOUNT done - results: KOH done, general parabasal cells noted,  ADNEXA: normal adnexa in size, nontender and no  masses. Bimanual exam non-tender.    Assessment & Plan:   A:  1. Atrophic vaginitis    P:  1. Estrace vaginal cream twice weekly 2. Lengthy discussion of HT and CAD. Discussed the benefits  Of vaginal estrogen to vaginal health, explaining how small doses of Vaginal estrogen 2x weekly are not associated with increased risk of MI, though systemic daily use of oral dosing of estrogen is associated with increased risk of MI.  3.     By signing my name below, I, Soijett Blue, attest that this documentation has been prepared under the direction and in the presence of Jonnie Kind, MD. Electronically Signed: Soijett Blue, ED Scribe. 07/18/16. 9:07 AM.  I personally performed the services described in this documentation, which was SCRIBED in my presence. The recorded information has been reviewed and considered accurate. It has been edited as necessary during review. Jonnie Kind, MD

## 2016-08-30 ENCOUNTER — Ambulatory Visit: Payer: Commercial Managed Care - HMO | Admitting: Obstetrics and Gynecology

## 2016-09-16 DIAGNOSIS — I10 Essential (primary) hypertension: Secondary | ICD-10-CM | POA: Diagnosis not present

## 2016-09-16 DIAGNOSIS — E109 Type 1 diabetes mellitus without complications: Secondary | ICD-10-CM | POA: Diagnosis not present

## 2016-09-16 DIAGNOSIS — H5200 Hypermetropia, unspecified eye: Secondary | ICD-10-CM | POA: Diagnosis not present

## 2016-09-16 DIAGNOSIS — H2513 Age-related nuclear cataract, bilateral: Secondary | ICD-10-CM | POA: Diagnosis not present

## 2016-10-18 DIAGNOSIS — E119 Type 2 diabetes mellitus without complications: Secondary | ICD-10-CM | POA: Diagnosis not present

## 2016-10-18 DIAGNOSIS — E782 Mixed hyperlipidemia: Secondary | ICD-10-CM | POA: Diagnosis not present

## 2016-10-18 DIAGNOSIS — Z1389 Encounter for screening for other disorder: Secondary | ICD-10-CM | POA: Diagnosis not present

## 2016-10-18 DIAGNOSIS — I10 Essential (primary) hypertension: Secondary | ICD-10-CM | POA: Diagnosis not present

## 2016-10-18 DIAGNOSIS — Z6831 Body mass index (BMI) 31.0-31.9, adult: Secondary | ICD-10-CM | POA: Diagnosis not present

## 2016-10-21 ENCOUNTER — Encounter: Payer: Self-pay | Admitting: Adult Health

## 2016-10-21 ENCOUNTER — Ambulatory Visit (INDEPENDENT_AMBULATORY_CARE_PROVIDER_SITE_OTHER): Payer: Commercial Managed Care - HMO | Admitting: Adult Health

## 2016-10-21 VITALS — BP 146/72 | HR 60 | Ht 60.5 in | Wt 165.0 lb

## 2016-10-21 DIAGNOSIS — E7800 Pure hypercholesterolemia, unspecified: Secondary | ICD-10-CM | POA: Diagnosis not present

## 2016-10-21 DIAGNOSIS — E0800 Diabetes mellitus due to underlying condition with hyperosmolarity without nonketotic hyperglycemic-hyperosmolar coma (NKHHC): Secondary | ICD-10-CM

## 2016-10-21 DIAGNOSIS — I10 Essential (primary) hypertension: Secondary | ICD-10-CM | POA: Diagnosis not present

## 2016-10-21 DIAGNOSIS — I2510 Atherosclerotic heart disease of native coronary artery without angina pectoris: Secondary | ICD-10-CM

## 2016-10-21 NOTE — Progress Notes (Signed)
[facility] Office Note   Date:  10/21/2016   ID:  [name], [name] 09/25/1946, MRN [id number]  PCP:  Glo Herring., MD  Cardiologist: Woodroe Chen, NP   Chief Complaint  Patient presents with  . Coronary Artery Disease  . Hypertension      History of Present Illness: [name] is a 70 y.o. female who presents for ongoing assessment and management of coronary artery disease, hyperlipidemia, hypertension, and diabetes. Patient has a history of PCI with bare-metal stent to occluded left circumflex artery in 2008. Follow-up catheterization also mentions scattered sequential 80% lesions in the LAD and a 60% lesion in the proximal RCA.  The patient was last seen in the office on 06/13/2014 by Dr. Bronson Ing, with increased dose of lisinopril to 10 mg daily in the setting of hypertension, she was continued on dual antiplatelet therapy and statin therapy.   She is here today having not been seen since 2015. She does not complain any recurrent chest pain dyspnea on exertion or fatigue. She is being followed by PCP this recently seen them within the last week. She is being followed for diabetes with medication adjustments. She has not had any new diagnoses, surgeries, ER visits or medication changes. She does note that someone has taken her off her statin therapy. She does not remember who or why.  Past Medical History:  Diagnosis Date  . CAD (coronary artery disease)   . Diabetes mellitus ([place])    type 2 x  1 or 2 yrs  . Fatty liver   . GERD (gastroesophageal reflux disease)   . Hypertension   . Seasonal allergies     Past Surgical History:  Procedure Laterality Date  . CARDIAC CATHETERIZATION  09/11/2007   100% occlusion of the left circumflex. proceed with stenting  . CARDIAC CATHETERIZATION  09/11/2007   mid circumflex occlusion with tandem 80% proximal LAD lesion to the 50% mid LAD lesion. nonDES stenting of the circumflex occlusion after  recanalization and balloon dilatation, the stent was a 2.75x58mm Liberte, TIMI3 flow was restored  . CARDIOVASCULAR STRESS TEST  03/24/2012   normal pattern of perfusion in all regions, no scintigraphic evidence of inducible myocardial ischemia  . CHOLECYSTECTOMY  2008  . COLONOSCOPY  06/17/2012   Procedure: COLONOSCOPY;  Surgeon: Rogene Houston, MD;  [facility];  Service: Endoscopy;  Laterality: N/A;  100  . DOPPLER ECHOCARDIOGRAPHY  07/20/2012   EF 60-65%, wall motion normal, there were no regional wall motion abnormalities  . Left breast lumpectomy    . LOWER EXTREMITY VENOUS DOPPLER Left 12/27/2011   no evidence of deep vein thrombosis involving the left lower extremity and right common femoral vein, enlarged inguinal lymph node is visualized on the left. no baker's cyst on the left.  Marland Kitchen PARTIAL HYSTERECTOMY       Current Outpatient Prescriptions  Medication Sig Dispense Refill  . aspirin EC 81 MG tablet Take 81 mg by mouth every morning.    . clopidogrel (PLAVIX) 75 MG tablet Take 1 tablet (75 mg total) by mouth every morning. 90 tablet 0  . ramipril (ALTACE) 10 MG capsule Take 1 capsule (10 mg total) by mouth 2 (two) times daily. 180 capsule 3  . SitaGLIPtin-MetFORMIN HCl (JANUMET XR) [phone] MG TB24 Take by mouth.     No current facility-administered medications for this visit.     Allergies:   Patient has no known allergies.    Social History:  The  patient  reports that she has never smoked. She has never used smokeless tobacco. She reports that she does not drink alcohol or use drugs.   Family History:  The patient's family history includes Cancer in her mother; Heart disease in her mother.    ROS: All other systems are reviewed and negative. Unless otherwise mentioned in H&P    PHYSICAL EXAM: VS:  BP (!) 146/72   Pulse 60   Ht 5' 0.5" (1.537 m)   Wt 165 lb (74.8 kg)   SpO2 98%   BMI 31.69 kg/m  , BMI Body mass index is 31.69 kg/m. GEN: Well nourished,  well developed, in no acute distress  HEENT: normal  Neck: no JVD, carotid bruits, or masses Cardiac: RRR; no murmurs, rubs, or gallops,no edema  Respiratory:  clear to auscultation bilaterally, normal work of breathing GI: soft, nontender, nondistended, + BS MS: no deformity or atrophy  Skin: warm and dry, no rash Neuro:  Strength and sensation are intact Psych: euthymic mood, full affect   EKG:  The ekg ordered today demonstrates sinus bradycardia heart rate of 56 bpm with nonspecific T-wave abnormalities T-wave flattening V3 through V6. T-wave inversion in lead III and aVF.    Recent Labs: 05/23/2016: ALT 22    Lipid Panel    Component Value Date/Time   CHOL 139 05/11/2013 0932   TRIG 150 (H) 05/11/2013 0932   HDL 43 05/11/2013 0932   CHOLHDL 3.2 05/11/2013 0932   VLDL 30 05/11/2013 0932   LDLCALC 66 05/11/2013 0932      Wt Readings from Last 3 Encounters:  10/21/16 165 lb (74.8 kg)  07/18/16 165 lb (74.8 kg)  05/23/16 168 lb 6.4 oz (76.4 kg)     ASSESSMENT AND PLAN:  1. Coronary artery disease: History of stent to the left circumflex in 2008, with scattered sequential 80% lesions in the LAD and 60% lesion in the proximal RCA. She is asymptomatic at this time. She is bradycardic not on any rate reducing medications. She continues on aspirin and Plavix along with ACE inhibitor. She is asymptomatic at this time but would probably benefit from a repeat stress test to evaluate for progression of coronary artery disease in the setting of hypertension and diabetes. We'll plan this on next office visit unless she becomes symptomatic.   2. Hypercholesterolemia: She is not on any statin therapy at this time. It is recommended that she be on statin therapy with known history of coronary artery disease and stenting. I will check fasting lipids and LFTs to evaluate her status to help to back recommendations on dosage. I've explained to her that will be important for her to remain on  statin therapy.  3. Hypertension: Currently blood pressure is elevated according to new JNC guidelines. Insole with diabetes blood pressure should be less than 130/60's. We'll follow this may need to add additional medications as she continues to be on ramipril 10 mg twice a day. May consider adding chlorthalidone.  4. Diabetes: She is being followed by PCP remains on Janumet XR.   Current medicines are reviewed at length with the patient today.    Labs/ tests ordered today include: fasting lipids LFTs and a BMET. Along with a TSH   Orders Placed This Encounter  Procedures  . EKG 12-Lead     Disposition:   FU with 6 months unless symptomatic or abnormalities in her blood work.  Signed, Jory Sims, NP  10/21/2016 3:12 PM    [facility] [address], [place], [place] [address] Phone: [phone]; Fax: [phone]

## 2016-10-21 NOTE — Addendum Note (Signed)
Addended by: Levonne Hubert on: 10/21/2016 03:39 PM   Modules accepted: Orders

## 2016-10-21 NOTE — Progress Notes (Signed)
Name: [name]    DOB: Aug 29, 1946  Age: 70 y.o.  MR#: [id number]       PCP:  Glo Herring., MD      Insurance: Payor: [employer]ICARE / Plan: [place] THN/NTSP / Product Type: *No Product type* /   CC:   No chief complaint on file.   VS Vitals:   10/21/16 1458  BP: (!) 146/72  Pulse: 60  SpO2: 98%  Weight: 165 lb (74.8 kg)  Height: 5' 0.5" (1.537 m)    Weights Current Weight  10/21/16 165 lb (74.8 kg)  07/18/16 165 lb (74.8 kg)  05/23/16 168 lb 6.4 oz (76.4 kg)    Blood Pressure  BP Readings from Last 3 Encounters:  10/21/16 (!) 146/72  07/18/16 (!) 140/94  05/23/16 132/60     Admit date:  (Not on file) Last encounter with RMR:  Visit date not found   Allergy Patient has no known allergies.  Current Outpatient Prescriptions  Medication Sig Dispense Refill  . aspirin EC 81 MG tablet Take 81 mg by mouth every morning.    . clopidogrel (PLAVIX) 75 MG tablet Take 1 tablet (75 mg total) by mouth every morning. 90 tablet 0  . ramipril (ALTACE) 10 MG capsule Take 1 capsule (10 mg total) by mouth 2 (two) times daily. 180 capsule 3  . SitaGLIPtin-MetFORMIN HCl (JANUMET XR) [phone] MG TB24 Take by mouth.     No current facility-administered medications for this visit.     Discontinued Meds:    Medications Discontinued During This Encounter  Medication Reason  . estradiol (ESTRACE VAGINAL) 0.1 MG/GM vaginal cream Error    Patient Active Problem List   Diagnosis Date Noted  . Elevated liver enzymes 05/13/2013  . IBS (irritable bowel syndrome) 10/22/2012  . Abdominal pain, acute, right lower quadrant 05/27/2012  . Hypertension 05/27/2012  . High cholesterol 05/27/2012  . CAD (coronary artery disease) 05/27/2012    LABS    Component Value Date/Time   NA 138 11/13/2013 0836   NA 137 05/11/2013 0932   NA 138 05/27/2012 1535   K 4.2 11/13/2013 0836   K 4.2 05/11/2013 0932   K 4.4 05/27/2012 1535   CL 103 11/13/2013 0836   CL 104 05/11/2013  0932   CL 101 05/27/2012 1535   CO2 27 11/13/2013 0836   CO2 26 05/11/2013 0932   CO2 27 05/27/2012 1535   GLUCOSE 106 (H) 11/13/2013 0836   GLUCOSE 228 (H) 05/11/2013 0932   GLUCOSE 81 05/27/2012 1535   BUN 14 11/13/2013 0836   BUN 13 05/11/2013 0932   BUN 12 05/27/2012 1535   CREATININE 0.78 11/13/2013 0836   CREATININE 0.86 05/11/2013 0932   CREATININE 0.82 05/27/2012 1535   CALCIUM 9.2 11/13/2013 0836   CALCIUM 9.3 05/11/2013 0932   CALCIUM 9.4 05/27/2012 1535   GFRNONAA >60 09/13/2007 0335   GFRNONAA >60 09/12/2007 0420   GFRNONAA >60 09/11/2007 1210   GFRAA  09/13/2007 0335    >60        The eGFR has been calculated using the MDRD equation. This calculation has not been validated in all clinical   GFRAA  09/12/2007 0420    >60        The eGFR has been calculated using the MDRD equation. This calculation has not been validated in all clinical   GFRAA  09/11/2007 1210    >60        The eGFR has been calculated  using the MDRD equation. This calculation has not been validated in all clinical   CMP     Component Value Date/Time   NA 138 11/13/2013 0836   K 4.2 11/13/2013 0836   CL 103 11/13/2013 0836   CO2 27 11/13/2013 0836   GLUCOSE 106 (H) 11/13/2013 0836   BUN 14 11/13/2013 0836   CREATININE 0.78 11/13/2013 0836   CALCIUM 9.2 11/13/2013 0836   PROT 6.9 05/23/2016 1000   ALBUMIN 4.1 05/23/2016 1000   AST 28 05/23/2016 1000   ALT 22 05/23/2016 1000   ALKPHOS 84 05/23/2016 1000   BILITOT 0.5 05/23/2016 1000   GFRNONAA >60 09/13/2007 0335   GFRAA  09/13/2007 0335    >60        The eGFR has been calculated using the MDRD equation. This calculation has not been validated in all clinical       Component Value Date/Time   WBC 6.7 05/11/2013 0932   WBC 7.7 09/13/2007 0335   WBC 7.5 09/12/2007 0420   HGB 13.3 05/11/2013 0932   HGB 11.0 (L) 09/13/2007 0335   HGB 11.4 (L) 09/12/2007 0420   HCT 39.9 05/11/2013 0932   HCT 32.1 (L) 09/13/2007 0335    HCT 33.9 (L) 09/12/2007 0420   MCV 84.4 05/11/2013 0932   MCV 85.0 09/13/2007 0335   MCV 85.2 09/12/2007 0420    Lipid Panel     Component Value Date/Time   CHOL 139 05/11/2013 0932   TRIG 150 (H) 05/11/2013 0932   HDL 43 05/11/2013 0932   CHOLHDL 3.2 05/11/2013 0932   VLDL 30 05/11/2013 0932   LDLCALC 66 05/11/2013 0932    ABG No results found for: PHART, PCO2ART, PO2ART, HCO3, TCO2, ACIDBASEDEF, O2SAT   Lab Results  Component Value Date   TSH 1.631 05/11/2013   BNP (last 3 results) No results for input(s): BNP in the last 8760 hours.  ProBNP (last 3 results) No results for input(s): PROBNP in the last 8760 hours.  Cardiac Panel (last 3 results) No results for input(s): CKTOTAL, CKMB, TROPONINI, RELINDX in the last 72 hours.  Iron/TIBC/Ferritin/ %Sat No results found for: IRON, TIBC, FERRITIN, IRONPCTSAT   EKG Orders placed or performed in visit on 10/21/16  . EKG 12-Lead     Prior Assessment and Plan Problem List as of 10/21/2016 Reviewed: 07/18/2016  9:44 AM by Jonnie Kind, MD     Cardiovascular and Mediastinum   Hypertension   CAD (coronary artery disease)     Digestive   IBS (irritable bowel syndrome)     Other   High cholesterol   Elevated liver enzymes   Abdominal pain, acute, right lower quadrant       Imaging: No results found.

## 2016-10-21 NOTE — Patient Instructions (Signed)
Your physician wants you to follow-up in: 6 Months with Dr. Bronson Ing.  You will receive a reminder letter in the mail two months in advance. If you don't receive a letter, please call our office to schedule the follow-up appointment.  Your physician recommends that you continue on your current medications as directed. Please refer to the Current Medication list given to you today.  Your physician recommends that you return for lab work Fasting ( Nothing to eat or drink after midnight)   If you need a refill on your cardiac medications before your next appointment, please call your pharmacy.  Thank you for choosing [facility]!

## 2016-10-22 DIAGNOSIS — E0800 Diabetes mellitus due to underlying condition with hyperosmolarity without nonketotic hyperglycemic-hyperosmolar coma (NKHHC): Secondary | ICD-10-CM | POA: Diagnosis not present

## 2016-10-22 DIAGNOSIS — E7800 Pure hypercholesterolemia, unspecified: Secondary | ICD-10-CM | POA: Diagnosis not present

## 2016-10-22 DIAGNOSIS — I2510 Atherosclerotic heart disease of native coronary artery without angina pectoris: Secondary | ICD-10-CM | POA: Diagnosis not present

## 2016-10-22 DIAGNOSIS — I10 Essential (primary) hypertension: Secondary | ICD-10-CM | POA: Diagnosis not present

## 2016-10-23 LAB — HEPATIC FUNCTION PANEL
ALBUMIN: 3.9 g/dL (ref 3.6–5.1)
ALT: 11 U/L (ref 6–29)
AST: 19 U/L (ref 10–35)
Alkaline Phosphatase: 82 U/L (ref 33–130)
Bilirubin, Direct: 0.1 mg/dL (ref <=0.2)
Indirect Bilirubin: 0.2 mg/dL (ref 0.2–1.2)
Total Bilirubin: 0.3 mg/dL (ref 0.2–1.2)
Total Protein: 6.8 g/dL (ref 6.1–8.1)

## 2016-10-23 LAB — LIPID PANEL
CHOLESTEROL: 219 mg/dL — AB (ref <200)
HDL: 45 mg/dL — ABNORMAL LOW (ref >50)
LDL Cholesterol: 147 mg/dL — ABNORMAL HIGH (ref <100)
Total CHOL/HDL Ratio: 4.9 Ratio (ref <5.0)
Triglycerides: 133 mg/dL (ref <150)
VLDL: 27 mg/dL (ref <30)

## 2016-10-23 LAB — BASIC METABOLIC PANEL
BUN: 15 mg/dL (ref 7–25)
CHLORIDE: 106 mmol/L (ref 98–110)
CO2: 24 mmol/L (ref 20–31)
Calcium: 9.2 mg/dL (ref 8.6–10.4)
Creat: 0.79 mg/dL (ref 0.50–0.99)
GLUCOSE: 91 mg/dL (ref 65–99)
POTASSIUM: 4.1 mmol/L (ref 3.5–5.3)
SODIUM: 139 mmol/L (ref 135–146)

## 2016-10-23 LAB — TSH: TSH: 2.04 mIU/L

## 2016-10-29 ENCOUNTER — Telehealth: Payer: Self-pay | Admitting: *Deleted

## 2016-10-29 DIAGNOSIS — Z79899 Other long term (current) drug therapy: Secondary | ICD-10-CM

## 2016-10-29 MED ORDER — ATORVASTATIN CALCIUM 40 MG PO TABS: 40.0000 mg | ORAL_TABLET | Freq: Every day | ORAL | 6 refills | Status: DC

## 2016-10-29 NOTE — Telephone Encounter (Signed)
-----   Message from Lendon Colonel, NP sent at 10/28/2016  6:46 AM EST ----- Please begin Lipitor 40 mg at HS. Cholesterol is elevated and needs better management. Send low cholesterol diet. Will need follow up fasting Lipids and LFT's in 6 weeks.

## 2016-12-10 DIAGNOSIS — Z79899 Other long term (current) drug therapy: Secondary | ICD-10-CM | POA: Diagnosis not present

## 2016-12-10 LAB — HEPATIC FUNCTION PANEL
ALT: 41 U/L — AB (ref 6–29)
AST: 39 U/L — ABNORMAL HIGH (ref 10–35)
Albumin: 3.9 g/dL (ref 3.6–5.1)
Alkaline Phosphatase: 107 U/L (ref 33–130)
BILIRUBIN INDIRECT: 0.5 mg/dL (ref 0.2–1.2)
Bilirubin, Direct: 0.1 mg/dL (ref <=0.2)
TOTAL PROTEIN: 7.1 g/dL (ref 6.1–8.1)
Total Bilirubin: 0.6 mg/dL (ref 0.2–1.2)

## 2016-12-11 ENCOUNTER — Telehealth: Payer: Self-pay | Admitting: *Deleted

## 2016-12-11 MED ORDER — PRAVASTATIN SODIUM 40 MG PO TABS: 40.0000 mg | ORAL_TABLET | Freq: Every evening | ORAL | 0 refills | Status: DC

## 2016-12-11 NOTE — Telephone Encounter (Signed)
-----   Message from Lendon Colonel, NP sent at 12/10/2016  4:24 PM EST ----- Hepatic levels are elevated on Atorvastatin. Stop this and begin Pravachol 40 mg daily and Fish oil tablets 1500 mg daily

## 2016-12-17 ENCOUNTER — Telehealth: Payer: Self-pay | Admitting: Adult Health

## 2016-12-17 NOTE — Telephone Encounter (Signed)
Pt states pharmacist asked why she was on pravachol,because her liver enzymes are elevated

## 2016-12-17 NOTE — Telephone Encounter (Signed)
Pt would like to speak w/ someone about why her medications have changed

## 2017-02-20 DIAGNOSIS — E782 Mixed hyperlipidemia: Secondary | ICD-10-CM | POA: Diagnosis not present

## 2017-02-20 DIAGNOSIS — Z681 Body mass index (BMI) 19 or less, adult: Secondary | ICD-10-CM | POA: Diagnosis not present

## 2017-02-20 DIAGNOSIS — E119 Type 2 diabetes mellitus without complications: Secondary | ICD-10-CM | POA: Diagnosis not present

## 2017-02-20 DIAGNOSIS — Z1389 Encounter for screening for other disorder: Secondary | ICD-10-CM | POA: Diagnosis not present

## 2017-02-20 DIAGNOSIS — Z0001 Encounter for general adult medical examination with abnormal findings: Secondary | ICD-10-CM | POA: Diagnosis not present

## 2017-02-20 DIAGNOSIS — I10 Essential (primary) hypertension: Secondary | ICD-10-CM | POA: Diagnosis not present

## 2017-02-20 DIAGNOSIS — R600 Localized edema: Secondary | ICD-10-CM | POA: Diagnosis not present

## 2017-04-23 DIAGNOSIS — R69 Illness, unspecified: Secondary | ICD-10-CM | POA: Diagnosis not present

## 2017-05-05 ENCOUNTER — Ambulatory Visit: Payer: Commercial Managed Care - HMO | Admitting: Cardiovascular Disease

## 2017-05-13 ENCOUNTER — Encounter (INDEPENDENT_AMBULATORY_CARE_PROVIDER_SITE_OTHER): Payer: Self-pay

## 2017-05-13 ENCOUNTER — Encounter (INDEPENDENT_AMBULATORY_CARE_PROVIDER_SITE_OTHER): Payer: Self-pay | Admitting: Internal Medicine

## 2017-05-26 ENCOUNTER — Ambulatory Visit (INDEPENDENT_AMBULATORY_CARE_PROVIDER_SITE_OTHER): Payer: Commercial Managed Care - HMO | Admitting: Internal Medicine

## 2017-06-05 ENCOUNTER — Telehealth: Payer: Self-pay

## 2017-06-05 DIAGNOSIS — Z1231 Encounter for screening mammogram for malignant neoplasm of breast: Secondary | ICD-10-CM | POA: Diagnosis not present

## 2017-06-05 NOTE — Telephone Encounter (Signed)
[phone] patient received letter to schedule tcs

## 2017-06-05 NOTE — Telephone Encounter (Signed)
I tried to call pt. ( she is a pt of Dr. Laural Golden and needs to follow up with him).  I LMOM for a return call.

## 2017-06-09 NOTE — Telephone Encounter (Signed)
I spoke to pt and she is aware the referral should have went to Dr. Laural Golden. Faxed letter to Dr. Gerarda Fraction for them to send to him.

## 2017-06-10 ENCOUNTER — Encounter (INDEPENDENT_AMBULATORY_CARE_PROVIDER_SITE_OTHER): Payer: Self-pay | Admitting: *Deleted

## 2017-07-15 ENCOUNTER — Encounter: Payer: Self-pay | Admitting: Cardiovascular Disease

## 2017-07-15 ENCOUNTER — Ambulatory Visit (INDEPENDENT_AMBULATORY_CARE_PROVIDER_SITE_OTHER): Payer: Medicare HMO | Admitting: Cardiovascular Disease

## 2017-07-15 VITALS — BP 140/84 | HR 49 | Ht 65.0 in | Wt 167.0 lb

## 2017-07-15 DIAGNOSIS — Z79899 Other long term (current) drug therapy: Secondary | ICD-10-CM

## 2017-07-15 DIAGNOSIS — I10 Essential (primary) hypertension: Secondary | ICD-10-CM | POA: Diagnosis not present

## 2017-07-15 DIAGNOSIS — E7800 Pure hypercholesterolemia, unspecified: Secondary | ICD-10-CM

## 2017-07-15 DIAGNOSIS — I25118 Atherosclerotic heart disease of native coronary artery with other forms of angina pectoris: Secondary | ICD-10-CM | POA: Diagnosis not present

## 2017-07-15 DIAGNOSIS — Z955 Presence of coronary angioplasty implant and graft: Secondary | ICD-10-CM | POA: Diagnosis not present

## 2017-07-15 MED ORDER — NITROGLYCERIN 0.4 MG SL SUBL: 0.4000 mg | SUBLINGUAL_TABLET | SUBLINGUAL | 3 refills | Status: AC | PRN

## 2017-07-15 NOTE — Patient Instructions (Signed)
Medication Instructions:   I have sent a prescription to your pharmacy for Nitroglycerin.    Nitroglycerin sublingual tablets What is this medicine? NITROGLYCERIN (nye troe GLI ser in) is a type of vasodilator. It relaxes blood vessels, increasing the blood and oxygen supply to your heart. This medicine is used to relieve chest pain caused by angina. It is also used to prevent chest pain before activities like climbing stairs, going outdoors in cold weather, or sexual activity. This medicine may be used for other purposes; ask your health care provider or pharmacist if you have questions. COMMON BRAND NAME(S): Nitroquick, Nitrostat, Nitrotab What should I tell my health care provider before I take this medicine? They need to know if you have any of these conditions: -anemia -head injury, recent stroke, or bleeding in the brain -liver disease -previous heart attack -an unusual or allergic reaction to nitroglycerin, other medicines, foods, dyes, or preservatives -pregnant or trying to get pregnant -breast-feeding How should I use this medicine? Take this medicine by mouth as needed. At the first sign of an angina attack (chest pain or tightness) place one tablet under your tongue. You can also take this medicine 5 to 10 minutes before an event likely to produce chest pain. Follow the directions on the prescription label. Let the tablet dissolve under the tongue. Do not swallow whole. Replace the dose if you accidentally swallow it. It will help if your mouth is not dry. Saliva around the tablet will help it to dissolve more quickly. Do not eat or drink, smoke or chew tobacco while a tablet is dissolving. If you are not better within 5 minutes after taking ONE dose of nitroglycerin, call 9-1-1 immediately to seek emergency medical care. Do not take more than 3 nitroglycerin tablets over 15 minutes. If you take this medicine often to relieve symptoms of angina, your doctor or health care professional  may provide you with different instructions to manage your symptoms. If symptoms do not go away after following these instructions, it is important to call 9-1-1 immediately. Do not take more than 3 nitroglycerin tablets over 15 minutes. Talk to your pediatrician regarding the use of this medicine in children. Special care may be needed. Overdosage: If you think you have taken too much of this medicine contact a poison control center or emergency room at once. NOTE: This medicine is only for you. Do not share this medicine with others. What if I miss a dose? This does not apply. This medicine is only used as needed. What may interact with this medicine? Do not take this medicine with any of the following medications: -certain migraine medicines like ergotamine and dihydroergotamine (DHE) -medicines used to treat erectile dysfunction like sildenafil, tadalafil, and vardenafil -riociguat This medicine may also interact with the following medications: -alteplase -aspirin -heparin -medicines for high blood pressure -medicines for mental depression -other medicines used to treat angina -phenothiazines like chlorpromazine, mesoridazine, prochlorperazine, thioridazine This list may not describe all possible interactions. Give your health care provider a list of all the medicines, herbs, non-prescription drugs, or dietary supplements you use. Also tell them if you smoke, drink alcohol, or use illegal drugs. Some items may interact with your medicine. What should I watch for while using this medicine? Tell your doctor or health care professional if you feel your medicine is no longer working. Keep this medicine with you at all times. Sit or lie down when you take your medicine to prevent falling if you feel dizzy or faint after  using it. Try to remain calm. This will help you to feel better faster. If you feel dizzy, take several deep breaths and lie down with your feet propped up, or bend forward with  your head resting between your knees. You may get drowsy or dizzy. Do not drive, use machinery, or do anything that needs mental alertness until you know how this drug affects you. Do not stand or sit up quickly, especially if you are an older patient. This reduces the risk of dizzy or fainting spells. Alcohol can make you more drowsy and dizzy. Avoid alcoholic drinks. Do not treat yourself for coughs, colds, or pain while you are taking this medicine without asking your doctor or health care professional for advice. Some ingredients may increase your blood pressure. What side effects may I notice from receiving this medicine? Side effects that you should report to your doctor or health care professional as soon as possible: -blurred vision -dry mouth -skin rash -sweating -the feeling of extreme pressure in the head -unusually weak or tired Side effects that usually do not require medical attention (report to your doctor or health care professional if they continue or are bothersome): -flushing of the face or neck -headache -irregular heartbeat, palpitations -nausea, vomiting This list may not describe all possible side effects. Call your doctor for medical advice about side effects. You may report side effects to FDA at 1-800-FDA-1088. Where should I keep my medicine? Keep out of the reach of children. Store at room temperature between 20 and 25 degrees C (68 and 77 degrees F). Store in [facility] [employer]. Protect from light and moisture. Keep tightly closed. Throw away any unused medicine after the expiration date. NOTE: This sheet is a summary. It may not cover all possible information. If you have questions about this medicine, talk to your doctor, pharmacist, or health care provider.  2018 Elsevier/Gold Standard (2013-09-16 17:57:36)  Labwork:  I will request a copy of most recent labs from pcp.   Testing/Procedures: none  Follow-Up: Your physician wants you to follow-up in: 1  year.  You will receive a reminder letter in the mail two months in advance. If you don't receive a letter, please call our office to schedule the follow-up appointment.   Any Other Special Instructions Will Be Listed Below (If Applicable).     If you need a refill on your cardiac medications before your next appointment, please call your pharmacy.

## 2017-07-15 NOTE — Progress Notes (Signed)
SUBJECTIVE: The patient is a 71 year old woman with a past medical history significant for coronary artery disease with a prior history of myocardial infarction, hyperlipidemia with previously elevated transaminases, hypertension and diabetes.  She underwent percutaneous coronary intervention for acute coronary syndrome on 09/11/2007 with a bare-metal stent placed in an occluded left circumflex coronary artery. That catheterization report mentions scattered sequential 80% lesions in the LAD. She was also noted to have a 60% lesion in the proximal RCA.   An echocardiogram performed in August 2013 revealed normal left ventricular systolic function with an ejection fraction of 60-65%, moderate left ventricular hypertrophy and grade 1 diastolic dysfunction.  This is my first time evaluating her since July 2015.  Lipids on 10/22/16 showed total cholesterol 219, triglycerides 133, HDL 45, LDL 147. She was started on Lipitor 40 mg by K. Lawrence DNP but she did not tolerate this. She is currently taking Crestor 10 mg. I do not have a copy of her most recent lipids.  She seldom has chest pains. Most recent nitroglycerin use was in 2017. She denies shortness of breath. She seldom has palpitations.  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 She exercises at home and does some walking as well.   Review of Systems: As per "subjective", otherwise negative.  No Known Allergies  Current Outpatient Prescriptions  Medication Sig Dispense Refill  . aspirin EC 81 MG tablet Take 81 mg by mouth every morning.    . clopidogrel (PLAVIX) 75 MG tablet Take 1 tablet (75 mg total) by mouth every morning. 90 tablet 0  . lisinopril (PRINIVIL,ZESTRIL) 20 MG tablet Take 20 mg by mouth daily.     Vladimir Faster Glycol-Propyl Glycol (SYSTANE OP) Apply to eye.    . rosuvastatin (CRESTOR) 10 MG tablet Take 10 mg by mouth daily.     . SitaGLIPtin-MetFORMIN HCl (JANUMET XR) [phone] MG TB24 Take by mouth.    .  vitamin B-12 (CYANOCOBALAMIN) 500 MCG tablet Take 500 mcg by mouth daily.    . nitroGLYCERIN (NITROSTAT) 0.4 MG SL tablet Place 1 tablet (0.4 mg total) under the tongue every 5 (five) minutes as needed for chest pain. 25 tablet 3  . pravastatin (PRAVACHOL) 40 MG tablet Take 1 tablet (40 mg total) by mouth every evening. 30 tablet 0   No current facility-administered medications for this visit.     Past Medical History:  Diagnosis Date  . CAD (coronary artery disease)   . Diabetes mellitus ([place])    type 2 x  1 or 2 yrs  . Fatty liver   . GERD (gastroesophageal reflux disease)   . Hypertension   . Seasonal allergies     Past Surgical History:  Procedure Laterality Date  . CARDIAC CATHETERIZATION  09/11/2007   100% occlusion of the left circumflex. proceed with stenting  . CARDIAC CATHETERIZATION  09/11/2007   mid circumflex occlusion with tandem 80% proximal LAD lesion to the 50% mid LAD lesion. nonDES stenting of the circumflex occlusion after recanalization and balloon dilatation, the stent was a 2.75x49mm Liberte, TIMI3 flow was restored  . CARDIOVASCULAR STRESS TEST  03/24/2012   normal pattern of perfusion in all regions, no scintigraphic evidence of inducible myocardial ischemia  . CHOLECYSTECTOMY  2008  . COLONOSCOPY  06/17/2012   Procedure: COLONOSCOPY;  Surgeon: Rogene Houston, MD;  Location: [facility];  Service: Endoscopy;  Laterality: N/A;  100  . DOPPLER ECHOCARDIOGRAPHY  07/20/2012   EF 60-65%, wall  motion normal, there were no regional wall motion abnormalities  . Left breast lumpectomy    . LOWER EXTREMITY VENOUS DOPPLER Left 12/27/2011   no evidence of deep vein thrombosis involving the left lower extremity and right common femoral vein, enlarged inguinal lymph node is visualized on the left. no baker's cyst on the left.  Marland Kitchen PARTIAL HYSTERECTOMY      Social History   Social History  . Marital status: Single    Spouse name: N/A  . Number of children: N/A  .  Years of education: N/A   Occupational History  . Not on file.   Social History Main Topics  . Smoking status: Never Smoker  . Smokeless tobacco: Never Used  . Alcohol use No  . Drug use: No  . Sexual activity: Not Currently    Birth control/ protection: Surgical   Other Topics Concern  . Not on file   Social History Narrative  . No narrative on file     Vitals:   07/15/17 0828  BP: 140/84  Pulse: (!) 49  SpO2: 98%  Weight: 167 lb (75.8 kg)  Height: 5\' 5"  (1.651 m)    Wt Readings from Last 3 Encounters:  07/15/17 167 lb (75.8 kg)  10/21/16 165 lb (74.8 kg)  07/18/16 165 lb (74.8 kg)     PHYSICAL EXAM General: NAD HEENT: Normal. Neck: No JVD, no thyromegaly. Lungs: Clear to auscultation bilaterally with normal respiratory effort. CV: Nondisplaced PMI.  Regular rate and rhythm, normal S1/S2, no S3/S4, no murmur. No pretibial or periankle edema.  No carotid bruit.   Abdomen: Soft, nontender, no distention.  Neurologic: Alert and oriented.  Psych: Normal affect. Skin: Normal. Musculoskeletal: No gross deformities.    ECG: Most recent ECG reviewed.   Labs: Lab Results  Component Value Date/Time   K 4.1 10/22/2016 08:11 AM   BUN 15 10/22/2016 08:11 AM   CREATININE 0.79 10/22/2016 08:11 AM   ALT 41 (H) 12/10/2016 07:49 AM   TSH 2.04 10/22/2016 08:11 AM   HGB 13.3 05/11/2013 09:32 AM     Lipids: Lab Results  Component Value Date/Time   LDLCALC 147 (H) 10/22/2016 08:11 AM   CHOL 219 (H) 10/22/2016 08:11 AM   TRIG 133 10/22/2016 08:11 AM   HDL 45 (L) 10/22/2016 08:11 AM       ASSESSMENT AND PLAN:  1. CAD: Symptomatically stable. Given her significant LAD lesions, and since she's not experiencing any bleeding problems, I plan to continue ASA, Plavix, and statin. I will prescribe sublingual nitroglycerin.  2. HTN: Mildly elevated. Will continue to monitor.  3. Hyperlipidemia: Continue Crestor 10 mg. I will obtain a copy of most recent  lipids.     Disposition: Follow up 1 yr.   Kate Sable, M.D., F.A.C.C.

## 2017-09-05 DIAGNOSIS — Z1389 Encounter for screening for other disorder: Secondary | ICD-10-CM | POA: Diagnosis not present

## 2017-09-05 DIAGNOSIS — Z6831 Body mass index (BMI) 31.0-31.9, adult: Secondary | ICD-10-CM | POA: Diagnosis not present

## 2017-09-05 DIAGNOSIS — I10 Essential (primary) hypertension: Secondary | ICD-10-CM | POA: Diagnosis not present

## 2017-09-05 DIAGNOSIS — E782 Mixed hyperlipidemia: Secondary | ICD-10-CM | POA: Diagnosis not present

## 2017-09-05 DIAGNOSIS — E119 Type 2 diabetes mellitus without complications: Secondary | ICD-10-CM | POA: Diagnosis not present

## 2017-09-08 DIAGNOSIS — E6609 Other obesity due to excess calories: Secondary | ICD-10-CM | POA: Diagnosis not present

## 2017-09-08 DIAGNOSIS — Z1389 Encounter for screening for other disorder: Secondary | ICD-10-CM | POA: Diagnosis not present

## 2017-09-08 DIAGNOSIS — Z6831 Body mass index (BMI) 31.0-31.9, adult: Secondary | ICD-10-CM | POA: Diagnosis not present

## 2017-10-06 DIAGNOSIS — E11319 Type 2 diabetes mellitus with unspecified diabetic retinopathy without macular edema: Secondary | ICD-10-CM | POA: Diagnosis not present

## 2017-10-06 DIAGNOSIS — E119 Type 2 diabetes mellitus without complications: Secondary | ICD-10-CM | POA: Diagnosis not present

## 2017-10-06 DIAGNOSIS — H2513 Age-related nuclear cataract, bilateral: Secondary | ICD-10-CM | POA: Diagnosis not present

## 2017-10-06 DIAGNOSIS — E113293 Type 2 diabetes mellitus with mild nonproliferative diabetic retinopathy without macular edema, bilateral: Secondary | ICD-10-CM | POA: Diagnosis not present

## 2017-12-01 DIAGNOSIS — Z1389 Encounter for screening for other disorder: Secondary | ICD-10-CM | POA: Diagnosis not present

## 2017-12-01 DIAGNOSIS — J3489 Other specified disorders of nose and nasal sinuses: Secondary | ICD-10-CM | POA: Diagnosis not present

## 2017-12-01 DIAGNOSIS — Z6832 Body mass index (BMI) 32.0-32.9, adult: Secondary | ICD-10-CM | POA: Diagnosis not present

## 2017-12-01 DIAGNOSIS — E6609 Other obesity due to excess calories: Secondary | ICD-10-CM | POA: Diagnosis not present

## 2017-12-01 DIAGNOSIS — J0100 Acute maxillary sinusitis, unspecified: Secondary | ICD-10-CM | POA: Diagnosis not present

## 2017-12-01 DIAGNOSIS — J029 Acute pharyngitis, unspecified: Secondary | ICD-10-CM | POA: Diagnosis not present

## 2017-12-19 ENCOUNTER — Telehealth: Payer: Self-pay | Admitting: Student

## 2017-12-19 NOTE — Telephone Encounter (Signed)
having some occassional chest pain, wanting to be seen, can be reached @  [phone] scheduled her to see B. Strader on 1/28

## 2017-12-19 NOTE — Telephone Encounter (Signed)
Spoke with pt, she has been having chest pains on and off for a few weeks. They come and go but she has not had to use any nitro. She thinks its is coming from a cold that she has had on and off for weeks. She wanted to make an appointment with Maude Leriche, PA. 12/29/2017

## 2017-12-29 ENCOUNTER — Ambulatory Visit: Payer: Medicare HMO | Admitting: Student

## 2018-04-09 DIAGNOSIS — I2510 Atherosclerotic heart disease of native coronary artery without angina pectoris: Secondary | ICD-10-CM | POA: Diagnosis not present

## 2018-04-09 DIAGNOSIS — E6609 Other obesity due to excess calories: Secondary | ICD-10-CM | POA: Diagnosis not present

## 2018-04-09 DIAGNOSIS — Z6832 Body mass index (BMI) 32.0-32.9, adult: Secondary | ICD-10-CM | POA: Diagnosis not present

## 2018-04-09 DIAGNOSIS — E7849 Other hyperlipidemia: Secondary | ICD-10-CM | POA: Diagnosis not present

## 2018-04-09 DIAGNOSIS — E8881 Metabolic syndrome: Secondary | ICD-10-CM | POA: Diagnosis not present

## 2018-04-09 DIAGNOSIS — E782 Mixed hyperlipidemia: Secondary | ICD-10-CM | POA: Diagnosis not present

## 2018-04-09 DIAGNOSIS — Z1389 Encounter for screening for other disorder: Secondary | ICD-10-CM | POA: Diagnosis not present

## 2018-04-09 DIAGNOSIS — E119 Type 2 diabetes mellitus without complications: Secondary | ICD-10-CM | POA: Diagnosis not present

## 2018-04-09 DIAGNOSIS — Z0001 Encounter for general adult medical examination with abnormal findings: Secondary | ICD-10-CM | POA: Diagnosis not present

## 2018-04-09 DIAGNOSIS — E1165 Type 2 diabetes mellitus with hyperglycemia: Secondary | ICD-10-CM | POA: Diagnosis not present

## 2018-04-09 DIAGNOSIS — I10 Essential (primary) hypertension: Secondary | ICD-10-CM | POA: Diagnosis not present

## 2018-09-21 DIAGNOSIS — R69 Illness, unspecified: Secondary | ICD-10-CM | POA: Diagnosis not present

## 2018-09-21 DIAGNOSIS — E6609 Other obesity due to excess calories: Secondary | ICD-10-CM | POA: Diagnosis not present

## 2018-09-21 DIAGNOSIS — Z6830 Body mass index (BMI) 30.0-30.9, adult: Secondary | ICD-10-CM | POA: Diagnosis not present

## 2018-09-21 DIAGNOSIS — E119 Type 2 diabetes mellitus without complications: Secondary | ICD-10-CM | POA: Diagnosis not present

## 2018-09-22 DIAGNOSIS — R69 Illness, unspecified: Secondary | ICD-10-CM | POA: Diagnosis not present

## 2018-09-23 DIAGNOSIS — E119 Type 2 diabetes mellitus without complications: Secondary | ICD-10-CM | POA: Diagnosis not present

## 2018-09-23 DIAGNOSIS — Z6830 Body mass index (BMI) 30.0-30.9, adult: Secondary | ICD-10-CM | POA: Diagnosis not present

## 2018-09-23 DIAGNOSIS — E6609 Other obesity due to excess calories: Secondary | ICD-10-CM | POA: Diagnosis not present

## 2018-10-09 ENCOUNTER — Encounter: Payer: Self-pay | Admitting: Cardiovascular Disease

## 2018-10-09 ENCOUNTER — Ambulatory Visit: Payer: Medicare HMO | Admitting: Cardiovascular Disease

## 2018-10-09 VITALS — BP 152/78 | HR 64 | Ht 65.0 in | Wt 170.0 lb

## 2018-10-09 DIAGNOSIS — I25118 Atherosclerotic heart disease of native coronary artery with other forms of angina pectoris: Secondary | ICD-10-CM | POA: Diagnosis not present

## 2018-10-09 DIAGNOSIS — I10 Essential (primary) hypertension: Secondary | ICD-10-CM

## 2018-10-09 DIAGNOSIS — Z955 Presence of coronary angioplasty implant and graft: Secondary | ICD-10-CM

## 2018-10-09 DIAGNOSIS — E785 Hyperlipidemia, unspecified: Secondary | ICD-10-CM | POA: Diagnosis not present

## 2018-10-09 NOTE — Patient Instructions (Signed)
Medication Instructions:  Your physician recommends that you continue on your current medications as directed. Please refer to the Current Medication list given to you today.  If you need a refill on your cardiac medications before your next appointment, please call your pharmacy.   Lab work: NONE If you have labs (blood work) drawn today and your tests are completely normal, you will receive your results only by: . MyChart Message (if you have MyChart) OR . A paper copy in the mail If you have any lab test that is abnormal or we need to change your treatment, we will call you to review the results.  Testing/Procedures: NONE  Follow-Up: At [facility], you and your health needs are our priority.  As part of our continuing mission to provide you with exceptional heart care, we have created designated Provider Care Teams.  These Care Teams include your primary Cardiologist (physician) and Advanced Practice Providers (APPs -  Physician Assistants and Nurse Practitioners) who all work together to provide you with the care you need, when you need it. You will need a follow up appointment in 1 years.  Please call our office 2 months in advance to schedule this appointment.  You may see Suresh Koneswaran, MD or one of the following Advanced Practice Providers on your designated Care Team:   Brittany Strader, PA-C ([place] Office) . Michele Lenze, PA-C ([place] Office)  Any Other Special Instructions Will Be Listed Below (If Applicable). NONE   

## 2018-10-09 NOTE — Progress Notes (Signed)
SUBJECTIVE: The patient presents for routine follow-up.  Past medical history is significant for coronary artery disease with a prior history of myocardial infarction, hyperlipidemia with previously elevated transaminases, hypertension and diabetes.  She underwent percutaneous coronary intervention for acute coronary syndrome on 09/11/2007 with a bare-metal stent placed in an occluded left circumflex coronary artery. That catheterization report mentions scattered sequential 80% lesions in the LAD. She was also noted to have a 60% lesion in the proximal RCA.   An echocardiogram performed in August 2013 revealed normal left ventricular systolic function with an ejection fraction of 60-65%, moderate left ventricular hypertrophy and grade 1 diastolic dysfunction.  She denies exertional chest pain and dyspnea.  She has not been walking and would like to get back to doing so.  She denies palpitations, lightheadedness, and dizziness.  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  ECG performed in the office today which I ordered and personally interpreted demonstrates sinus bradycardia, 51 bpm, with nonspecific T wave abnormalities.     Review of Systems: As per "subjective", otherwise negative.  No Known Allergies  Current Outpatient Medications  Medication Sig Dispense Refill  . aspirin EC 81 MG tablet Take 81 mg by mouth every morning.    . Biotin 5 MG CAPS Take by mouth.    . clopidogrel (PLAVIX) 75 MG tablet Take 1 tablet (75 mg total) by mouth every morning. 90 tablet 0  . lisinopril (PRINIVIL,ZESTRIL) 20 MG tablet Take 20 mg by mouth daily.     . nitroGLYCERIN (NITROSTAT) 0.4 MG SL tablet Place 1 tablet (0.4 mg total) under the tongue every 5 (five) minutes as needed for chest pain. 25 tablet 3  . Polyethyl Glycol-Propyl Glycol (SYSTANE OP) Apply to eye.    . rosuvastatin (CRESTOR) 10 MG tablet Take 10 mg by mouth daily.     . SitaGLIPtin-MetFORMIN HCl (JANUMET XR)  [phone] MG TB24 Take by mouth.     No current facility-administered medications for this visit.     Past Medical History:  Diagnosis Date  . CAD (coronary artery disease)   . Diabetes mellitus ([place])    type 2 x  1 or 2 yrs  . Fatty liver   . GERD (gastroesophageal reflux disease)   . Hypertension   . Seasonal allergies     Past Surgical History:  Procedure Laterality Date  . CARDIAC CATHETERIZATION  09/11/2007   100% occlusion of the left circumflex. proceed with stenting  . CARDIAC CATHETERIZATION  09/11/2007   mid circumflex occlusion with tandem 80% proximal LAD lesion to the 50% mid LAD lesion. nonDES stenting of the circumflex occlusion after recanalization and balloon dilatation, the stent was a 2.75x3mm Liberte, TIMI3 flow was restored  . CARDIOVASCULAR STRESS TEST  03/24/2012   normal pattern of perfusion in all regions, no scintigraphic evidence of inducible myocardial ischemia  . CHOLECYSTECTOMY  2008  . COLONOSCOPY  06/17/2012   Procedure: COLONOSCOPY;  Surgeon: Rogene Houston, MD;  Location: [facility];  Service: Endoscopy;  Laterality: N/A;  100  . DOPPLER ECHOCARDIOGRAPHY  07/20/2012   EF 60-65%, wall motion normal, there were no regional wall motion abnormalities  . Left breast lumpectomy    . LOWER EXTREMITY VENOUS DOPPLER Left 12/27/2011   no evidence of deep vein thrombosis involving the left lower extremity and right common femoral vein, enlarged inguinal lymph node is visualized on the left. no baker's cyst on the left.  Marland Kitchen PARTIAL HYSTERECTOMY  Social History   Socioeconomic History  . Marital status: Single    Spouse name: Not on file  . Number of children: Not on file  . Years of education: Not on file  . Highest education level: Not on file  Occupational History  . Not on file  Social Needs  . Financial resource strain: Not on file  . Food insecurity:    Worry: Not on file    Inability: Not on file  . Transportation needs:    Medical:  Not on file    Non-medical: Not on file  Tobacco Use  . Smoking status: Never Smoker  . Smokeless tobacco: Never Used  Substance and Sexual Activity  . Alcohol use: No  . Drug use: No  . Sexual activity: Not Currently    Birth control/protection: Surgical  Lifestyle  . Physical activity:    Days per week: Not on file    Minutes per session: Not on file  . Stress: Not on file  Relationships  . Social connections:    Talks on phone: Not on file    Gets together: Not on file    Attends religious service: Not on file    Active member of club or organization: Not on file    Attends meetings of clubs or organizations: Not on file    Relationship status: Not on file  . Intimate partner violence:    Fear of current or ex partner: Not on file    Emotionally abused: Not on file    Physically abused: Not on file    Forced sexual activity: Not on file  Other Topics Concern  . Not on file  Social History Narrative  . Not on file     Vitals:   10/09/18 0820  BP: (!) 152/78  Pulse: 64  SpO2: 98%  Weight: 170 lb (77.1 kg)  Height: 5\' 5"  (1.651 m)    Wt Readings from Last 3 Encounters:  10/09/18 170 lb (77.1 kg)  07/15/17 167 lb (75.8 kg)  10/21/16 165 lb (74.8 kg)     PHYSICAL EXAM General: NAD HEENT: Normal. Neck: No JVD, no thyromegaly. Lungs: Clear to auscultation bilaterally with normal respiratory effort. CV: Bradycardic, regular rhythm, normal S1/S2, no S3/S4, no murmur. No pretibial or periankle edema.  No carotid bruit.   Abdomen: Soft, nontender, no distention.  Neurologic: Alert and oriented.  Psych: Normal affect. Skin: Normal. Musculoskeletal: No gross deformities.    ECG: Reviewed above under Subjective   Labs: Lab Results  Component Value Date/Time   K 4.1 10/22/2016 08:11 AM   BUN 15 10/22/2016 08:11 AM   CREATININE 0.79 10/22/2016 08:11 AM   ALT 41 (H) 12/10/2016 07:49 AM   TSH 2.04 10/22/2016 08:11 AM   HGB 13.3 05/11/2013 09:32 AM      Lipids: Lab Results  Component Value Date/Time   LDLCALC 147 (H) 10/22/2016 08:11 AM   CHOL 219 (H) 10/22/2016 08:11 AM   TRIG 133 10/22/2016 08:11 AM   HDL 45 (L) 10/22/2016 08:11 AM       ASSESSMENT AND PLAN: 1.  Coronary artery disease: Symptomatically stable. Given her significant LAD lesions, and since she's not experiencing any bleeding problems, I plan to continue ASA, Plavix, and statin.  2.  Hypertension: Elevated today but reportedly normal at PCPs office 2 weeks ago.  She did not take her medications this morning.  This will need continued monitoring.  3.  Hyperlipidemia: I will obtain a copy of lipids from  PCP.  Continue rosuvastatin 10 mg.    Disposition: Follow up 1 year.   Kate Sable, M.D., F.A.C.C.

## 2018-10-14 DIAGNOSIS — E119 Type 2 diabetes mellitus without complications: Secondary | ICD-10-CM | POA: Diagnosis not present

## 2019-02-01 DIAGNOSIS — Z0100 Encounter for examination of eyes and vision without abnormal findings: Secondary | ICD-10-CM | POA: Diagnosis not present

## 2019-04-15 DIAGNOSIS — I2510 Atherosclerotic heart disease of native coronary artery without angina pectoris: Secondary | ICD-10-CM | POA: Diagnosis not present

## 2019-04-15 DIAGNOSIS — Z1389 Encounter for screening for other disorder: Secondary | ICD-10-CM | POA: Diagnosis not present

## 2019-04-15 DIAGNOSIS — Z0001 Encounter for general adult medical examination with abnormal findings: Secondary | ICD-10-CM | POA: Diagnosis not present

## 2019-04-15 DIAGNOSIS — Z6831 Body mass index (BMI) 31.0-31.9, adult: Secondary | ICD-10-CM | POA: Diagnosis not present

## 2019-04-15 DIAGNOSIS — I10 Essential (primary) hypertension: Secondary | ICD-10-CM | POA: Diagnosis not present

## 2019-04-15 DIAGNOSIS — E119 Type 2 diabetes mellitus without complications: Secondary | ICD-10-CM | POA: Diagnosis not present

## 2019-04-15 DIAGNOSIS — E1165 Type 2 diabetes mellitus with hyperglycemia: Secondary | ICD-10-CM | POA: Diagnosis not present

## 2019-04-15 DIAGNOSIS — E6609 Other obesity due to excess calories: Secondary | ICD-10-CM | POA: Diagnosis not present

## 2019-04-15 DIAGNOSIS — E7849 Other hyperlipidemia: Secondary | ICD-10-CM | POA: Diagnosis not present

## 2019-04-16 DIAGNOSIS — Z1389 Encounter for screening for other disorder: Secondary | ICD-10-CM | POA: Diagnosis not present

## 2019-04-16 DIAGNOSIS — E1165 Type 2 diabetes mellitus with hyperglycemia: Secondary | ICD-10-CM | POA: Diagnosis not present

## 2019-04-16 DIAGNOSIS — Z6831 Body mass index (BMI) 31.0-31.9, adult: Secondary | ICD-10-CM | POA: Diagnosis not present

## 2019-04-16 DIAGNOSIS — E6609 Other obesity due to excess calories: Secondary | ICD-10-CM | POA: Diagnosis not present

## 2019-07-16 DIAGNOSIS — E1165 Type 2 diabetes mellitus with hyperglycemia: Secondary | ICD-10-CM | POA: Diagnosis not present

## 2019-07-16 DIAGNOSIS — E7849 Other hyperlipidemia: Secondary | ICD-10-CM | POA: Diagnosis not present

## 2019-07-16 DIAGNOSIS — E119 Type 2 diabetes mellitus without complications: Secondary | ICD-10-CM | POA: Diagnosis not present

## 2019-07-16 DIAGNOSIS — I2510 Atherosclerotic heart disease of native coronary artery without angina pectoris: Secondary | ICD-10-CM | POA: Diagnosis not present

## 2019-07-16 DIAGNOSIS — I10 Essential (primary) hypertension: Secondary | ICD-10-CM | POA: Diagnosis not present

## 2019-07-16 DIAGNOSIS — Z6830 Body mass index (BMI) 30.0-30.9, adult: Secondary | ICD-10-CM | POA: Diagnosis not present

## 2019-09-01 DIAGNOSIS — I10 Essential (primary) hypertension: Secondary | ICD-10-CM | POA: Diagnosis not present

## 2019-09-01 DIAGNOSIS — E1165 Type 2 diabetes mellitus with hyperglycemia: Secondary | ICD-10-CM | POA: Diagnosis not present

## 2019-10-02 DIAGNOSIS — E119 Type 2 diabetes mellitus without complications: Secondary | ICD-10-CM | POA: Diagnosis not present

## 2019-10-02 DIAGNOSIS — E7849 Other hyperlipidemia: Secondary | ICD-10-CM | POA: Diagnosis not present

## 2019-10-02 DIAGNOSIS — I10 Essential (primary) hypertension: Secondary | ICD-10-CM | POA: Diagnosis not present

## 2019-10-20 DIAGNOSIS — I10 Essential (primary) hypertension: Secondary | ICD-10-CM | POA: Diagnosis not present

## 2019-10-27 ENCOUNTER — Other Ambulatory Visit: Payer: Self-pay

## 2019-10-27 ENCOUNTER — Encounter: Payer: Self-pay | Admitting: Cardiovascular Disease

## 2019-10-27 ENCOUNTER — Ambulatory Visit: Payer: Medicare HMO | Admitting: Cardiovascular Disease

## 2019-10-27 VITALS — BP 212/89 | HR 60 | Temp 96.0°F | Ht 60.5 in | Wt 173.0 lb

## 2019-10-27 DIAGNOSIS — Z955 Presence of coronary angioplasty implant and graft: Secondary | ICD-10-CM

## 2019-10-27 DIAGNOSIS — E785 Hyperlipidemia, unspecified: Secondary | ICD-10-CM | POA: Diagnosis not present

## 2019-10-27 DIAGNOSIS — I25118 Atherosclerotic heart disease of native coronary artery with other forms of angina pectoris: Secondary | ICD-10-CM | POA: Diagnosis not present

## 2019-10-27 DIAGNOSIS — I10 Essential (primary) hypertension: Secondary | ICD-10-CM | POA: Diagnosis not present

## 2019-10-27 MED ORDER — AMLODIPINE BESYLATE 5 MG PO TABS: 5.0000 mg | ORAL_TABLET | Freq: Every day | ORAL | 3 refills | Status: DC

## 2019-10-27 NOTE — Patient Instructions (Signed)
Medication Instructions:  START NORVASC 5 MG - TAKE ONCE EVERY MORNING    Labwork: NONE  Testing/Procedures: NONE  Follow-Up: Your physician wants you to follow-up in: 1 YEAR.  You will receive a reminder letter in the mail two months in advance. If you don't receive a letter, please call our office to schedule the follow-up appointment.     Any Other Special Instructions Will Be Listed Below (If Applicable).     If you need a refill on your cardiac medications before your next appointment, please call your pharmacy.

## 2019-10-27 NOTE — Progress Notes (Signed)
SUBJECTIVE: The patient presents for routine follow-up.  Past medical history is significant for coronary artery disease with a prior history of myocardial infarction, hyperlipidemia with previously elevated transaminases, hypertension and diabetes.  She underwent percutaneous coronary intervention for acute coronary syndrome on 09/11/2007 with a bare-metal stent placed in an occluded left circumflex coronary artery. That catheterization report mentions scattered sequential 80% lesions in the LAD. She was also noted to have a 60% lesion in the proximal RCA.   An echocardiogram performed in August 2013 revealed normal left ventricular systolic function with an ejection fraction of 60-65%, moderate left ventricular hypertrophy and grade 1 diastolic dysfunction.  ECG performed today which I personally view demonstrates sinus bradycardia with T wave inversions in leads III, aVF, and V3 through V6.  There is slightly more pronounced when compared to the ECG performed in November 2019.  The patient denies any symptoms of chest pain, palpitations, shortness of breath, lightheadedness, dizziness, leg swelling, orthopnea, PND, and syncope.  Her blood pressure has been elevated and her PCP started having her take lisinopril 20 mg twice daily since last week.  Systolic blood pressures remain in the 170 range at home after taking her medications.  She has not taken her medications yet.   Review of Systems: As per "subjective", otherwise negative.  No Known Allergies  Current Outpatient Medications  Medication Sig Dispense Refill  . aspirin EC 81 MG tablet Take 81 mg by mouth every morning.    . Cholecalciferol (VITAMIN D) 125 MCG (5000 UT) CAPS Take by mouth.    . clopidogrel (PLAVIX) 75 MG tablet Take 1 tablet (75 mg total) by mouth every morning. 90 tablet 0  . lisinopril (PRINIVIL,ZESTRIL) 20 MG tablet Take 20 mg by mouth daily.     . nitroGLYCERIN (NITROSTAT) 0.4 MG SL tablet Place 1  tablet (0.4 mg total) under the tongue every 5 (five) minutes as needed for chest pain. 25 tablet 3  . Polyethyl Glycol-Propyl Glycol (SYSTANE OP) Apply to eye.    . rosuvastatin (CRESTOR) 10 MG tablet Take 10 mg by mouth daily.     . SitaGLIPtin-MetFORMIN HCl (JANUMET XR) [phone] MG TB24 Take by mouth.     No current facility-administered medications for this visit.     Past Medical History:  Diagnosis Date  . CAD (coronary artery disease)   . Diabetes mellitus ([place])    type 2 x  1 or 2 yrs  . Fatty liver   . GERD (gastroesophageal reflux disease)   . Hypertension   . Seasonal allergies     Past Surgical History:  Procedure Laterality Date  . CARDIAC CATHETERIZATION  09/11/2007   100% occlusion of the left circumflex. proceed with stenting  . CARDIAC CATHETERIZATION  09/11/2007   mid circumflex occlusion with tandem 80% proximal LAD lesion to the 50% mid LAD lesion. nonDES stenting of the circumflex occlusion after recanalization and balloon dilatation, the stent was a 2.75x15mm Liberte, TIMI3 flow was restored  . CARDIOVASCULAR STRESS TEST  03/24/2012   normal pattern of perfusion in all regions, no scintigraphic evidence of inducible myocardial ischemia  . CHOLECYSTECTOMY  2008  . COLONOSCOPY  06/17/2012   Procedure: COLONOSCOPY;  Surgeon: Rogene Houston, MD;  Location[facility];  Service: Endoscopy;  Laterality: N/A;  100  . DOPPLER ECHOCARDIOGRAPHY  07/20/2012   EF 60-65%, wall motion normal, there were no regional wall motion abnormalities  . Left breast lumpectomy    . LOWER EXTREMITY  VENOUS DOPPLER Left 12/27/2011   no evidence of deep vein thrombosis involving the left lower extremity and right common femoral vein, enlarged inguinal lymph node is visualized on the left. no baker's cyst on the left.  Marland Kitchen PARTIAL HYSTERECTOMY      Social History   Socioeconomic History  . Marital status: Single    Spouse name: Not on file  . Number of children: Not on file  . Years  of education: Not on file  . Highest education level: Not on file  Occupational History  . Not on file  Social Needs  . Financial resource strain: Not on file  . Food insecurity    Worry: Not on file    Inability: Not on file  . Transportation needs    Medical: Not on file    Non-medical: Not on file  Tobacco Use  . Smoking status: Never Smoker  . Smokeless tobacco: Never Used  Substance and Sexual Activity  . Alcohol use: No  . Drug use: No  . Sexual activity: Not Currently    Birth control/protection: Surgical  Lifestyle  . Physical activity    Days per week: Not on file    Minutes per session: Not on file  . Stress: Not on file  Relationships  . Social Herbalist on phone: Not on file    Gets together: Not on file    Attends religious service: Not on file    Active member of club or organization: Not on file    Attends meetings of clubs or organizations: Not on file    Relationship status: Not on file  . Intimate partner violence    Fear of current or ex partner: Not on file    Emotionally abused: Not on file    Physically abused: Not on file    Forced sexual activity: Not on file  Other Topics Concern  . Not on file  Social History Narrative  . Not on file     Vitals:   10/27/19 0842  BP: (!) 212/89  Pulse: 60  Temp: (!) 96 F (35.6 C)  TempSrc: Temporal  SpO2: 98%  Weight: 173 lb (78.5 kg)  Height: 5' 0.5" (1.537 m)    Wt Readings from Last 3 Encounters:  10/27/19 173 lb (78.5 kg)  10/09/18 170 lb (77.1 kg)  07/15/17 167 lb (75.8 kg)     PHYSICAL EXAM General: NAD HEENT: Normal. Neck: No JVD, no thyromegaly. Lungs: Clear to auscultation bilaterally with normal respiratory effort. CV: Regular rate and rhythm, normal S1/S2, no S3/S4, no murmur. No pretibial or periankle edema.  No carotid bruit.   Abdomen: Soft, nontender, no distention.  Neurologic: Alert and oriented.  Psych: Normal affect. Skin: Normal. Musculoskeletal: No  gross deformities.      Labs: Lab Results  Component Value Date/Time   K 4.1 10/22/2016 08:11 AM   BUN 15 10/22/2016 08:11 AM   CREATININE 0.79 10/22/2016 08:11 AM   ALT 41 (H) 12/10/2016 07:49 AM   TSH 2.04 10/22/2016 08:11 AM   HGB 13.3 05/11/2013 09:32 AM     Lipids: Lab Results  Component Value Date/Time   LDLCALC 147 (H) 10/22/2016 08:11 AM   CHOL 219 (H) 10/22/2016 08:11 AM   TRIG 133 10/22/2016 08:11 AM   HDL 45 (L) 10/22/2016 08:11 AM       ASSESSMENT AND PLAN: 1.  Coronary artery disease: Symptomatically stable. Given her significant LAD lesions and since she's not experiencing any  bleeding problems, I plan to continue ASA, Plavix andstatin.  I will aim to control blood pressure.  2.  Hypertension: Blood pressure is markedly elevated.  Lisinopril increased to 20 mg twice daily last week by her PCP.  She has not taken her medications yet.  I will add amlodipine 5 mg daily.  3.  Hyperlipidemia: Continue rosuvastatin 10 mg.    Disposition: Follow up 1 year   Kate Sable, M.D., F.A.C.C.

## 2019-12-22 ENCOUNTER — Other Ambulatory Visit (HOSPITAL_COMMUNITY): Payer: Self-pay | Admitting: Family Medicine

## 2019-12-22 DIAGNOSIS — Z1231 Encounter for screening mammogram for malignant neoplasm of breast: Secondary | ICD-10-CM

## 2019-12-29 ENCOUNTER — Ambulatory Visit (HOSPITAL_COMMUNITY)
Admission: RE | Admit: 2019-12-29 | Discharge: 2019-12-29 | Disposition: A | Discharge status: Home / Self Care | Payer: Medicare Other | Source: Ambulatory Visit | Attending: Internal Medicine | Admitting: Internal Medicine

## 2019-12-29 ENCOUNTER — Other Ambulatory Visit: Payer: Self-pay

## 2019-12-29 ENCOUNTER — Other Ambulatory Visit (HOSPITAL_COMMUNITY): Payer: Self-pay | Admitting: Internal Medicine

## 2019-12-29 DIAGNOSIS — R109 Unspecified abdominal pain: Secondary | ICD-10-CM

## 2019-12-29 DIAGNOSIS — R1909 Other intra-abdominal and pelvic swelling, mass and lump: Secondary | ICD-10-CM | POA: Diagnosis not present

## 2019-12-29 DIAGNOSIS — N390 Urinary tract infection, site not specified: Secondary | ICD-10-CM | POA: Diagnosis not present

## 2019-12-29 DIAGNOSIS — E119 Type 2 diabetes mellitus without complications: Secondary | ICD-10-CM | POA: Diagnosis not present

## 2019-12-29 DIAGNOSIS — N2889 Other specified disorders of kidney and ureter: Secondary | ICD-10-CM | POA: Diagnosis not present

## 2019-12-29 DIAGNOSIS — R1032 Left lower quadrant pain: Secondary | ICD-10-CM | POA: Diagnosis not present

## 2019-12-29 LAB — POCT I-STAT CREATININE: Creatinine, Ser: 0.8 mg/dL (ref 0.44–1.00)

## 2019-12-29 MED ORDER — IOHEXOL 300 MG/ML  SOLN
100.0000 mL | Freq: Once | INTRAMUSCULAR | Status: AC | PRN
  Administered 2019-12-29: 100 mL via INTRAVENOUS

## 2020-01-02 DIAGNOSIS — E11319 Type 2 diabetes mellitus with unspecified diabetic retinopathy without macular edema: Secondary | ICD-10-CM | POA: Diagnosis not present

## 2020-01-02 DIAGNOSIS — I10 Essential (primary) hypertension: Secondary | ICD-10-CM | POA: Diagnosis not present

## 2020-01-02 DIAGNOSIS — I2510 Atherosclerotic heart disease of native coronary artery without angina pectoris: Secondary | ICD-10-CM | POA: Diagnosis not present

## 2020-01-02 DIAGNOSIS — E113293 Type 2 diabetes mellitus with mild nonproliferative diabetic retinopathy without macular edema, bilateral: Secondary | ICD-10-CM | POA: Diagnosis not present

## 2020-01-07 ENCOUNTER — Other Ambulatory Visit: Payer: Self-pay

## 2020-01-07 NOTE — Telephone Encounter (Signed)
Spoke with Jonni Sanger at [facility]. Informed him that on 10/27/19 pt was started on 5 mg of Norvasc in our office.

## 2020-01-07 NOTE — Telephone Encounter (Signed)
Per [facility]Andy,Pharm.,Please confirm amlodipine dosage, 5 or 10 mg.  Thanks renee

## 2020-01-10 ENCOUNTER — Encounter (INDEPENDENT_AMBULATORY_CARE_PROVIDER_SITE_OTHER): Payer: Self-pay | Admitting: Internal Medicine

## 2020-01-10 ENCOUNTER — Other Ambulatory Visit (INDEPENDENT_AMBULATORY_CARE_PROVIDER_SITE_OTHER): Payer: Self-pay | Admitting: *Deleted

## 2020-01-10 ENCOUNTER — Other Ambulatory Visit: Payer: Self-pay

## 2020-01-10 ENCOUNTER — Telehealth (INDEPENDENT_AMBULATORY_CARE_PROVIDER_SITE_OTHER): Payer: Self-pay | Admitting: *Deleted

## 2020-01-10 ENCOUNTER — Telehealth: Payer: Self-pay | Admitting: Cardiovascular Disease

## 2020-01-10 ENCOUNTER — Encounter (INDEPENDENT_AMBULATORY_CARE_PROVIDER_SITE_OTHER): Payer: Self-pay | Admitting: *Deleted

## 2020-01-10 ENCOUNTER — Ambulatory Visit (INDEPENDENT_AMBULATORY_CARE_PROVIDER_SITE_OTHER): Payer: Medicare Other | Admitting: Internal Medicine

## 2020-01-10 VITALS — BP 183/90 | HR 59 | Temp 97.2°F | Ht 61.5 in | Wt 173.2 lb

## 2020-01-10 DIAGNOSIS — G8929 Other chronic pain: Secondary | ICD-10-CM

## 2020-01-10 DIAGNOSIS — R1031 Right lower quadrant pain: Secondary | ICD-10-CM | POA: Diagnosis not present

## 2020-01-10 DIAGNOSIS — R935 Abnormal findings on diagnostic imaging of other abdominal regions, including retroperitoneum: Secondary | ICD-10-CM

## 2020-01-10 MED ORDER — SUPREP BOWEL PREP KIT 17.5-3.13-1.6 GM/177ML PO SOLN: 1.0000 | Freq: Once | ORAL | 0 refills | Status: AC

## 2020-01-10 NOTE — Patient Instructions (Signed)
Colonoscopy to be scheduled. 

## 2020-01-10 NOTE — H&P (View-Only) (Signed)
Reason for consultation  Right lower quadrant abdominal pain and abnormal CT revealing soft tissue density in ileocecal region.  History of present illness  Patient is 74 year old Afro-American female who is referred through courtesy of Dr. Alfonzo Beers for GI evaluation. Patient has history of right lower quadrant abdominal pain dating back to 2013.  She underwent colonoscopy by me July 2013 revealing normal terminal ileum and colon exam.  She had abdominal pelvic CT in December 2014 revealing no visceral abnormalities to account for this pain.  I felt that this pain was referred pain from her back. She was recently evaluated by Dr. Gerarda Fraction and underwent CT on 12/29/2019 which revealed soft tissue density in the region of ileocecal valve and further evaluation was advised. Patient states her pain has been gradually getting worse and occurring more often.  Lately she has had this pain every day.  Pain is primarily located in right lower quadrant and radiates up to right costal margin laterally as well as to her back and across lower abdomen.  She describes this as a nagging pain and gives it a score of 5.  She has been using Advil no more than 400 mg daily and it helps ease pain.  Her cardiologist Dr. Bronson Ing advised to keep Advil use to minimum.  She seemed to have more pain before her bowels moved.  She has experienced nausea at times but has never vomited.  She may experience waterbrash.  Pain is eases after her bowels have moved.  She is not having diarrhea or constipation.  On most days she has 1 or 2 formed stools.  She says she has no pain when she wakes up in the morning.  Her appetite is good and her weight has been stable.  She denies melena or rectal bleeding. She also complains of numbness involving outer aspect of left thigh.  She says about one year ago she had shooting pain into her right leg and felt weak and fell down.  She denies chest pain or shortness of breath.  She may have heartburn  occasionally with certain foods. Review of her records revealed that she had MR of lumbar spine in July 2010 revealing marked multilevel facet arthropathy worse at L3-L4 and L4-L5 on the right side at both levels.  She also had mild central canal narrowing at these levels due to ligamentum flavum thickening and disc bulging.  Current Medications: Outpatient Encounter Medications as of 01/10/2020  Medication Sig  . amLODipine (NORVASC) 5 MG tablet Take 1 tablet (5 mg total) by mouth daily.  Marland Kitchen aspirin EC 81 MG tablet Take 81 mg by mouth every morning.  . Cholecalciferol (VITAMIN D) 125 MCG (5000 UT) CAPS Take by mouth daily.   . clopidogrel (PLAVIX) 75 MG tablet Take 1 tablet (75 mg total) by mouth every morning.  Marland Kitchen lisinopril (PRINIVIL,ZESTRIL) 20 MG tablet Take 20 mg by mouth 2 (two) times daily.   Vladimir Faster Glycol-Propyl Glycol (SYSTANE OP) Apply 1 drop to eye 2 (two) times daily.   . rosuvastatin (CRESTOR) 10 MG tablet Take 10 mg by mouth daily.   . SitaGLIPtin-MetFORMIN HCl (JANUMET XR) [phone] MG TB24 Take by mouth daily.   . nitroGLYCERIN (NITROSTAT) 0.4 MG SL tablet Place 1 tablet (0.4 mg total) under the tongue every 5 (five) minutes as needed for chest pain.   No facility-administered encounter medications on file as of 01/10/2020.   Past medical history  Hypertension Coronary artery disease.  She suffered MI in October 2008.  She had stenting to occluded left circumflex artery she has not had any problems since then.  She is followed by Dr. Bronson Ing. Hyperlipidemia. Diabetes mellitus of about 6 years duration. Irritable bowel syndrome. History of mildly elevated transaminases dating back to June 2016 presumed to be due to fatty liver. She had normal colonoscopy in October 2003 and again in July 2013. Laparoscopic cholecystectomy in December 2003 for symptomatic cholelithiasis. Hysterectomy several years ago.  She still has her ovaries.  Allergies  No Known  Allergies  Family history  Her father died when she was 73 years old. Mother lived to be 75.  She has 2 brothers and 2 sisters all of whom are younger and in good health as far as she knows.  Social history  She is single.  She has never married.  She has 1 daughter age 20 in good health.  She worked in [employer] until she developed coronary artery disease.  She went on disability.  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  She has never smoked cigarettes and does not drink alcohol.  Physical examination  Blood pressure (!) 183/90, pulse (!) 59, temperature (!) 97.2 F (36.2 C), temperature source Temporal, height 5' 1.5" (1.562 m), weight 173 lb 3.2 oz (78.6 kg). Patient is alert and in no acute distress. She is wearing a facial mask. Conjunctiva is pink. Sclera is nonicteric Oropharyngeal mucosa is normal. She has partial upper plate and few of her remaining teeth lower jaw. No neck masses or thyromegaly noted. Cardiac exam with regular rhythm normal S1 and S2. No murmur or gallop noted. Lungs are clear to auscultation. Abdomen is symmetrical.  She has lower midline scar.  Bowel sounds are normal.  No bruit noted.  On palpation abdomen is soft and nontender without organomegaly or masses. Rectal examination deferred. No LE edema or clubbing noted.  Labs/studies Results: Lab data from 07/21/2019 WBC 7.4 H&H 13.3 and 37.9. Platelet count 211K  Glucose 134 BUN 13 creatinine 0.87 Serum sodium 138, potassium 4.3, chloride 105 CO2 25 Alkaline phosphatase 107 AST 51 and ALT 41 serum albumin 4.2  Abdominal pelvic CT from 12/29/2019 reviewed. Soft tissue density noted in the region of ileocecal valve. 1.4 x 0.9 cm lesion in medial upper pole of right kidney warrants further evaluation. Fatty liver. Spinal stenosis at L4-5 due to disc protrusion and bony hypertrophy. Small for a man of [place] hernia on the left posteriorly  unchanged since prior study of 2014.   Assessment:  Patient is 74 year old African American female with history of right lower quadrant abdominal pain dating back to 2013 who is now experiencing some degree of pain daily.  She had CT about 2 weeks ago which revealed soft tissues density in ileocecal region consistent concerning for a mass.  This may very well be false positive study but given change in her chronic pain and CT abnormality this area needs to be directly evaluated via colonoscopy.  Please note her last colonoscopy was in July 2013 revealing normal terminal ileum and colon. I suspect most of her pain is related to back problems but she may also have IBS given that some of her pain eases with defecation.  History of mildly elevated transaminases felt to be secondary to fatty liver.  She also had ultrasound in June 2016 revealing fatty liver.  Patient is on rosuvastatin.  Therefore will need to monitor her transaminases  to make sure they are not trending upwards.  Recommendations  Diagnostic colonoscopy to be scheduled as soon as possible. Will check with Dr. Bronson Ing about stopping clopidogrel for 5 days but she can continue low-dose aspirin. Will check LFTs at the time of colonoscopy. Further recommendations to follow.

## 2020-01-10 NOTE — Telephone Encounter (Signed)
That would be fine 

## 2020-01-10 NOTE — Telephone Encounter (Signed)
LM on Ann's voicemail that Dr.Koneswaran is ok to stop plavix today  Attempted to call pt, line rang and then said "memory full"

## 2020-01-10 NOTE — Telephone Encounter (Signed)
Patient needs suprep TCS sch'd 2/12

## 2020-01-10 NOTE — Telephone Encounter (Signed)
Patient scheduled for Colonoscopy Jan 14, 2020. They want to know if she can stop her Plavix today

## 2020-01-10 NOTE — Telephone Encounter (Signed)
I will forward to Dr Koneswaran 

## 2020-01-10 NOTE — Progress Notes (Signed)
Reason for consultation  Right lower quadrant abdominal pain and abnormal CT revealing soft tissue density in ileocecal region.  History of present illness  Patient is 74 year old Afro-American female who is referred through courtesy of Dr. Alfonzo Beers for GI evaluation. Patient has history of right lower quadrant abdominal pain dating back to 2013.  She underwent colonoscopy by me July 2013 revealing normal terminal ileum and colon exam.  She had abdominal pelvic CT in December 2014 revealing no visceral abnormalities to account for this pain.  I felt that this pain was referred pain from her back. She was recently evaluated by Dr. Gerarda Fraction and underwent CT on 12/29/2019 which revealed soft tissue density in the region of ileocecal valve and further evaluation was advised. Patient states her pain has been gradually getting worse and occurring more often.  Lately she has had this pain every day.  Pain is primarily located in right lower quadrant and radiates up to right costal margin laterally as well as to her back and across lower abdomen.  She describes this as a nagging pain and gives it a score of 5.  She has been using Advil no more than 400 mg daily and it helps ease pain.  Her cardiologist Dr. Bronson Ing advised to keep Advil use to minimum.  She seemed to have more pain before her bowels moved.  She has experienced nausea at times but has never vomited.  She may experience waterbrash.  Pain is eases after her bowels have moved.  She is not having diarrhea or constipation.  On most days she has 1 or 2 formed stools.  She says she has no pain when she wakes up in the morning.  Her appetite is good and her weight has been stable.  She denies melena or rectal bleeding. She also complains of numbness involving outer aspect of left thigh.  She says about one year ago she had shooting pain into her right leg and felt weak and fell down.  She denies chest pain or shortness of breath.  She may have heartburn  occasionally with certain foods. Review of her records revealed that she had MR of lumbar spine in July 2010 revealing marked multilevel facet arthropathy worse at L3-L4 and L4-L5 on the right side at both levels.  She also had mild central canal narrowing at these levels due to ligamentum flavum thickening and disc bulging.  Current Medications: Outpatient Encounter Medications as of 01/10/2020  Medication Sig  . amLODipine (NORVASC) 5 MG tablet Take 1 tablet (5 mg total) by mouth daily.  Marland Kitchen aspirin EC 81 MG tablet Take 81 mg by mouth every morning.  . Cholecalciferol (VITAMIN D) 125 MCG (5000 UT) CAPS Take by mouth daily.   . clopidogrel (PLAVIX) 75 MG tablet Take 1 tablet (75 mg total) by mouth every morning.  Marland Kitchen lisinopril (PRINIVIL,ZESTRIL) 20 MG tablet Take 20 mg by mouth 2 (two) times daily.   Vladimir Faster Glycol-Propyl Glycol (SYSTANE OP) Apply 1 drop to eye 2 (two) times daily.   . rosuvastatin (CRESTOR) 10 MG tablet Take 10 mg by mouth daily.   . SitaGLIPtin-MetFORMIN HCl (JANUMET XR) [phone] MG TB24 Take by mouth daily.   . nitroGLYCERIN (NITROSTAT) 0.4 MG SL tablet Place 1 tablet (0.4 mg total) under the tongue every 5 (five) minutes as needed for chest pain.   No facility-administered encounter medications on file as of 01/10/2020.   Past medical history  Hypertension Coronary artery disease.  She suffered MI in October 2008.  She had stenting to occluded left circumflex artery she has not had any problems since then.  She is followed by Dr. Bronson Ing. Hyperlipidemia. Diabetes mellitus of about 6 years duration. Irritable bowel syndrome. History of mildly elevated transaminases dating back to June 2016 presumed to be due to fatty liver. She had normal colonoscopy in October 2003 and again in July 2013. Laparoscopic cholecystectomy in December 2003 for symptomatic cholelithiasis. Hysterectomy several years ago.  She still has her ovaries.  Allergies  No Known  Allergies  Family history  Her father died when she was 55 years old. Mother lived to be 43.  She has 2 brothers and 2 sisters all of whom are younger and in good health as far as she knows.  Social history  She is single.  She has never married.  She has 1 daughter age 20 in good health.  She worked in [employer] until she developed coronary artery disease.  She went on disability.  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  She has never smoked cigarettes and does not drink alcohol.  Physical examination  Blood pressure (!) 183/90, pulse (!) 59, temperature (!) 97.2 F (36.2 C), temperature source Temporal, height 5' 1.5" (1.562 m), weight 173 lb 3.2 oz (78.6 kg). Patient is alert and in no acute distress. She is wearing a facial mask. Conjunctiva is pink. Sclera is nonicteric Oropharyngeal mucosa is normal. She has partial upper plate and few of her remaining teeth lower jaw. No neck masses or thyromegaly noted. Cardiac exam with regular rhythm normal S1 and S2. No murmur or gallop noted. Lungs are clear to auscultation. Abdomen is symmetrical.  She has lower midline scar.  Bowel sounds are normal.  No bruit noted.  On palpation abdomen is soft and nontender without organomegaly or masses. Rectal examination deferred. No LE edema or clubbing noted.  Labs/studies Results: Lab data from 07/21/2019 WBC 7.4 H&H 13.3 and 37.9. Platelet count 211K  Glucose 134 BUN 13 creatinine 0.87 Serum sodium 138, potassium 4.3, chloride 105 CO2 25 Alkaline phosphatase 107 AST 51 and ALT 41 serum albumin 4.2  Abdominal pelvic CT from 12/29/2019 reviewed. Soft tissue density noted in the region of ileocecal valve. 1.4 x 0.9 cm lesion in medial upper pole of right kidney warrants further evaluation. Fatty liver. Spinal stenosis at L4-5 due to disc protrusion and bony hypertrophy. Small for a man of [place] hernia on the left posteriorly  unchanged since prior study of 2014.   Assessment:  Patient is 74 year old African American female with history of right lower quadrant abdominal pain dating back to 2013 who is now experiencing some degree of pain daily.  She had CT about 2 weeks ago which revealed soft tissues density in ileocecal region consistent concerning for a mass.  This may very well be false positive study but given change in her chronic pain and CT abnormality this area needs to be directly evaluated via colonoscopy.  Please note her last colonoscopy was in July 2013 revealing normal terminal ileum and colon. I suspect most of her pain is related to back problems but she may also have IBS given that some of her pain eases with defecation.  History of mildly elevated transaminases felt to be secondary to fatty liver.  She also had ultrasound in June 2016 revealing fatty liver.  Patient is on rosuvastatin.  Therefore will need to monitor her transaminases  to make sure they are not trending upwards.  Recommendations  Diagnostic colonoscopy to be scheduled as soon as possible. Will check with Dr. Bronson Ing about stopping clopidogrel for 5 days but she can continue low-dose aspirin. Will check LFTs at the time of colonoscopy. Further recommendations to follow.

## 2020-01-11 NOTE — Patient Instructions (Signed)
[name]  01/11/2020     @PREFPERIOPPHARMACY @   Your procedure is scheduled on  01/14/2020 .  Report to [facility] at  1200  P.M.  Call this number if you have problems the morning of surgery:  [phone]   Remember:  Follow the diet and prep instructions given to you by Dr Olevia Perches office.                       Take these medicines the morning of surgery with A SIP OF WATER  Amlodipine.    Do not wear jewelry, make-up or nail polish.  Do not wear lotions, powders, or perfumes. Please wear deodorant and brush your teeth.  Do not shave 48 hours prior to surgery.  Men may shave face and neck.  Do not bring valuables to the hospital.  [facility] is not responsible for any belongings or valuables.  Contacts, dentures or bridgework may not be worn into surgery.  Leave your suitcase in the car.  After surgery it may be brought to your room.  For patients admitted to the hospital, discharge time will be determined by your treatment team.  Patients discharged the day of surgery will not be allowed to drive home.   Name and phone number of your driver:   family Special instructions:  None  Please read over the following fact sheets that you were given. Anesthesia Post-op Instructions and Care and Recovery After Surgery       Colonoscopy, Adult, Care After This sheet gives you information about how to care for yourself after your procedure. Your health care provider may also give you more specific instructions. If you have problems or questions, contact your health care provider. What can I expect after the procedure? After the procedure, it is common to have:  A small amount of blood in your stool for 24 hours after the procedure.  Some gas.  Mild cramping or bloating of your abdomen. Follow these instructions at home: Eating and drinking   Drink enough fluid to keep your urine pale yellow.  Follow instructions from your health care provider about eating  or drinking restrictions.  Resume your normal diet as instructed by your health care provider. Avoid heavy or fried foods that are hard to digest. Activity  Rest as told by your health care provider.  Avoid sitting for a long time without moving. Get up to take short walks every 1-2 hours. This is important to improve blood flow and breathing. Ask for help if you feel weak or unsteady.  Return to your normal activities as told by your health care provider. Ask your health care provider what activities are safe for you. Managing cramping and bloating   Try walking around when you have cramps or feel bloated.  Apply heat to your abdomen as told by your health care provider. Use the heat source that your health care provider recommends, such as a moist heat pack or a heating pad. ? Place a towel between your skin and the heat source. ? Leave the heat on for 20-30 minutes. ? Remove the heat if your skin turns bright red. This is especially important if you are unable to feel pain, heat, or cold. You may have a greater risk of getting burned. General instructions  For the first 24 hours after the procedure: ? Do not drive or use machinery. ? Do not sign important documents. ? Do not drink  alcohol. ? Do your regular daily activities at a slower pace than normal. ? Eat soft foods that are easy to digest.  Take over-the-counter and prescription medicines only as told by your health care provider.  Keep all follow-up visits as told by your health care provider. This is important. Contact a health care provider if:  You have blood in your stool 2-3 days after the procedure. Get help right away if you have:  More than a small spotting of blood in your stool.  Large blood clots in your stool.  Swelling of your abdomen.  Nausea or vomiting.  A fever.  Increasing pain in your abdomen that is not relieved with medicine. Summary  After the procedure, it is common to have a small  amount of blood in your stool. You may also have mild cramping and bloating of your abdomen.  For the first 24 hours after the procedure, do not drive or use machinery, sign important documents, or drink alcohol.  Get help right away if you have a lot of blood in your stool, nausea or vomiting, a fever, or increased pain in your abdomen. This information is not intended to replace advice given to you by your health care provider. Make sure you discuss any questions you have with your health care provider. Document Revised: 06/14/2019 Document Reviewed: 06/14/2019 Elsevier Patient Education  [place] These instructions provide you with information about caring for yourself after your procedure. Your health care provider may also give you more specific instructions. Your treatment has been planned according to current medical practices, but problems sometimes occur. Call your health care provider if you have any problems or questions after your procedure. What can I expect after the procedure? After your procedure, you may:  Feel sleepy for several hours.  Feel clumsy and have poor balance for several hours.  Feel forgetful about what happened after the procedure.  Have poor judgment for several hours.  Feel nauseous or vomit.  Have a sore throat if you had a breathing tube during the procedure. Follow these instructions at home: For at least 24 hours after the procedure:      Have a responsible adult stay with you. It is important to have someone help care for you until you are awake and alert.  Rest as needed.  Do not: ? Participate in activities in which you could fall or become injured. ? Drive. ? Use heavy machinery. ? Drink alcohol. ? Take sleeping pills or medicines that cause drowsiness. ? Make important decisions or sign legal documents. ? Take care of children on your own. Eating and drinking  Follow the diet that is  recommended by your health care provider.  If you vomit, drink water, juice, or soup when you can drink without vomiting.  Make sure you have little or no nausea before eating solid foods. General instructions  Take over-the-counter and prescription medicines only as told by your health care provider.  If you have sleep apnea, surgery and certain medicines can increase your risk for breathing problems. Follow instructions from your health care provider about wearing your sleep device: ? Anytime you are sleeping, including during daytime naps. ? While taking prescription pain medicines, sleeping medicines, or medicines that make you drowsy.  If you smoke, do not smoke without supervision.  Keep all follow-up visits as told by your health care provider. This is important. Contact a health care provider if:  You keep feeling nauseous or  you keep vomiting.  You feel light-headed.  You develop a rash.  You have a fever. Get help right away if:  You have trouble breathing. Summary  For several hours after your procedure, you may feel sleepy and have poor judgment.  Have a responsible adult stay with you for at least 24 hours or until you are awake and alert. This information is not intended to replace advice given to you by your health care provider. Make sure you discuss any questions you have with your health care provider. Document Revised: 02/16/2018 Document Reviewed: 03/10/2016 Elsevier Patient Education  [place].

## 2020-01-12 ENCOUNTER — Encounter (HOSPITAL_COMMUNITY): Payer: Self-pay

## 2020-01-12 ENCOUNTER — Encounter (HOSPITAL_COMMUNITY)
Admission: RE | Admit: 2020-01-12 | Discharge: 2020-01-12 | Disposition: A | Discharge status: Home / Self Care | Payer: Medicare Other | Source: Ambulatory Visit | Attending: Internal Medicine | Admitting: Internal Medicine

## 2020-01-12 ENCOUNTER — Ambulatory Visit (HOSPITAL_COMMUNITY)
Admission: RE | Admit: 2020-01-12 | Discharge: 2020-01-12 | Disposition: A | Discharge status: Home / Self Care | Payer: Medicare Other | Source: Ambulatory Visit | Attending: Family Medicine | Admitting: Family Medicine

## 2020-01-12 ENCOUNTER — Other Ambulatory Visit (HOSPITAL_COMMUNITY)
Admission: RE | Admit: 2020-01-12 | Discharge: 2020-01-12 | Disposition: A | Discharge status: Home / Self Care | Payer: Medicare Other | Source: Ambulatory Visit | Attending: Internal Medicine | Admitting: Internal Medicine

## 2020-01-12 ENCOUNTER — Other Ambulatory Visit: Payer: Self-pay

## 2020-01-12 DIAGNOSIS — Z20822 Contact with and (suspected) exposure to covid-19: Secondary | ICD-10-CM | POA: Diagnosis not present

## 2020-01-12 DIAGNOSIS — R1031 Right lower quadrant pain: Secondary | ICD-10-CM | POA: Diagnosis not present

## 2020-01-12 DIAGNOSIS — R935 Abnormal findings on diagnostic imaging of other abdominal regions, including retroperitoneum: Secondary | ICD-10-CM

## 2020-01-12 DIAGNOSIS — G8929 Other chronic pain: Secondary | ICD-10-CM | POA: Insufficient documentation

## 2020-01-12 DIAGNOSIS — Z1231 Encounter for screening mammogram for malignant neoplasm of breast: Secondary | ICD-10-CM | POA: Insufficient documentation

## 2020-01-12 DIAGNOSIS — Z01818 Encounter for other preprocedural examination: Secondary | ICD-10-CM | POA: Insufficient documentation

## 2020-01-12 HISTORY — DX: Unspecified osteoarthritis, unspecified site: M19.90

## 2020-01-12 LAB — BASIC METABOLIC PANEL
Anion gap: 7 (ref 5–15)
BUN: 13 mg/dL (ref 8–23)
CO2: 25 mmol/L (ref 22–32)
Calcium: 8.6 mg/dL — ABNORMAL LOW (ref 8.9–10.3)
Chloride: 105 mmol/L (ref 98–111)
Creatinine, Ser: 0.8 mg/dL (ref 0.44–1.00)
GFR calc Af Amer: >60 mL/min (ref >60)
GFR calc non Af Amer: >60 mL/min (ref >60)
Glucose, Bld: 144 mg/dL — ABNORMAL HIGH (ref 70–99)
Potassium: 4 mmol/L (ref 3.5–5.1)
Sodium: 137 mmol/L (ref 135–145)

## 2020-01-12 LAB — SARS CORONAVIRUS 2 (TAT 6-24 HRS): SARS Coronavirus 2: NEGATIVE

## 2020-01-14 ENCOUNTER — Other Ambulatory Visit: Payer: Self-pay

## 2020-01-14 ENCOUNTER — Encounter (HOSPITAL_COMMUNITY): Payer: Self-pay | Admitting: Internal Medicine

## 2020-01-14 ENCOUNTER — Encounter (HOSPITAL_COMMUNITY)
Admission: RE | Disposition: A | Discharge status: Home / Self Care | Payer: Self-pay | Source: Home / Self Care | Attending: Internal Medicine

## 2020-01-14 ENCOUNTER — Ambulatory Visit (HOSPITAL_COMMUNITY): Payer: Medicare Other | Admitting: Anesthesiology

## 2020-01-14 ENCOUNTER — Ambulatory Visit (HOSPITAL_COMMUNITY)
Admission: RE | Admit: 2020-01-14 | Discharge: 2020-01-14 | Disposition: A | Discharge status: Home / Self Care | Payer: Medicare Other | Attending: Internal Medicine | Admitting: Internal Medicine

## 2020-01-14 DIAGNOSIS — M1990 Unspecified osteoarthritis, unspecified site: Secondary | ICD-10-CM | POA: Insufficient documentation

## 2020-01-14 DIAGNOSIS — R935 Abnormal findings on diagnostic imaging of other abdominal regions, including retroperitoneum: Secondary | ICD-10-CM | POA: Diagnosis not present

## 2020-01-14 DIAGNOSIS — Z9049 Acquired absence of other specified parts of digestive tract: Secondary | ICD-10-CM | POA: Insufficient documentation

## 2020-01-14 DIAGNOSIS — K589 Irritable bowel syndrome without diarrhea: Secondary | ICD-10-CM | POA: Diagnosis not present

## 2020-01-14 DIAGNOSIS — R110 Nausea: Secondary | ICD-10-CM | POA: Diagnosis not present

## 2020-01-14 DIAGNOSIS — R933 Abnormal findings on diagnostic imaging of other parts of digestive tract: Secondary | ICD-10-CM | POA: Insufficient documentation

## 2020-01-14 DIAGNOSIS — K760 Fatty (change of) liver, not elsewhere classified: Secondary | ICD-10-CM | POA: Diagnosis not present

## 2020-01-14 DIAGNOSIS — I10 Essential (primary) hypertension: Secondary | ICD-10-CM | POA: Diagnosis not present

## 2020-01-14 DIAGNOSIS — Z955 Presence of coronary angioplasty implant and graft: Secondary | ICD-10-CM | POA: Insufficient documentation

## 2020-01-14 DIAGNOSIS — R1031 Right lower quadrant pain: Secondary | ICD-10-CM | POA: Diagnosis not present

## 2020-01-14 DIAGNOSIS — E119 Type 2 diabetes mellitus without complications: Secondary | ICD-10-CM | POA: Diagnosis not present

## 2020-01-14 DIAGNOSIS — Z79899 Other long term (current) drug therapy: Secondary | ICD-10-CM | POA: Diagnosis not present

## 2020-01-14 DIAGNOSIS — I2510 Atherosclerotic heart disease of native coronary artery without angina pectoris: Secondary | ICD-10-CM | POA: Diagnosis not present

## 2020-01-14 DIAGNOSIS — I252 Old myocardial infarction: Secondary | ICD-10-CM | POA: Insufficient documentation

## 2020-01-14 DIAGNOSIS — Z7984 Long term (current) use of oral hypoglycemic drugs: Secondary | ICD-10-CM | POA: Insufficient documentation

## 2020-01-14 DIAGNOSIS — Z7982 Long term (current) use of aspirin: Secondary | ICD-10-CM | POA: Diagnosis not present

## 2020-01-14 DIAGNOSIS — E785 Hyperlipidemia, unspecified: Secondary | ICD-10-CM | POA: Insufficient documentation

## 2020-01-14 DIAGNOSIS — Z90710 Acquired absence of both cervix and uterus: Secondary | ICD-10-CM | POA: Diagnosis not present

## 2020-01-14 DIAGNOSIS — K5730 Diverticulosis of large intestine without perforation or abscess without bleeding: Secondary | ICD-10-CM | POA: Insufficient documentation

## 2020-01-14 DIAGNOSIS — R001 Bradycardia, unspecified: Secondary | ICD-10-CM | POA: Diagnosis not present

## 2020-01-14 DIAGNOSIS — G8929 Other chronic pain: Secondary | ICD-10-CM | POA: Insufficient documentation

## 2020-01-14 DIAGNOSIS — R200 Anesthesia of skin: Secondary | ICD-10-CM | POA: Diagnosis not present

## 2020-01-14 DIAGNOSIS — R948 Abnormal results of function studies of other organs and systems: Secondary | ICD-10-CM | POA: Insufficient documentation

## 2020-01-14 HISTORY — PX: COLONOSCOPY WITH PROPOFOL: SHX5780

## 2020-01-14 LAB — GLUCOSE, CAPILLARY
Glucose-Capillary: 157 mg/dL — ABNORMAL HIGH (ref 70–99)
Glucose-Capillary: 118 mg/dL — ABNORMAL HIGH (ref 70–99)

## 2020-01-14 SURGERY — COLONOSCOPY WITH PROPOFOL
Anesthesia: General

## 2020-01-14 MED ORDER — CHLORHEXIDINE GLUCONATE CLOTH 2 % EX PADS: 6.0000 | MEDICATED_PAD | Freq: Once | CUTANEOUS | Status: DC

## 2020-01-14 MED ORDER — PROPOFOL 10 MG/ML IV BOLUS
INTRAVENOUS | Status: DC | PRN
  Administered 2020-01-14 (×3): 20 mg via INTRAVENOUS

## 2020-01-14 MED ORDER — PROPOFOL 500 MG/50ML IV EMUL
INTRAVENOUS | Status: DC | PRN
  Administered 2020-01-14: 200 ug/kg/min via INTRAVENOUS

## 2020-01-14 MED ORDER — LACTATED RINGERS IV SOLN: Freq: Once | INTRAVENOUS | Status: AC

## 2020-01-14 MED ORDER — LACTATED RINGERS IV SOLN: INTRAVENOUS | Status: DC | PRN

## 2020-01-14 NOTE — Interval H&P Note (Signed)
Patient reports no change in her symptoms.  She remains with pain in right lower quadrant.  She did not experience rectal bleeding when she took the prep.  Patient has been off clopidogrel for 5 days and was okayed by her cardiologist Dr. Bronson Ing. Cardiac exam with regular rhythm normal S1 and S2.  No murmur gallop noted.  Auscultation lungs reveal vesicular breath sounds bilaterally. Abdomen is symmetrical with very faint low midline scar.  Abdomen is soft.  She remains with mild tenderness in right lower quadrant without guarding or rebound.  No organomegaly or masses. Patient is agreeable to proceed with colonoscopy.  History and Physical Interval Note:  01/14/2020 12:55 PM  [name]  has presented today for surgery, with the diagnosis of colon mass.  The various methods of treatment have been discussed with the patient and family. After consideration of risks, benefits and other options for treatment, the patient has consented to  Procedure(s) with comments: COLONOSCOPY WITH PROPOFOL (N/A) - 135 as a surgical intervention.  The patient's history has been reviewed, patient examined, no change in status, stable for surgery.  I have reviewed the patient's chart and labs.  Questions were answered to the patient's satisfaction.     [employer]

## 2020-01-14 NOTE — Anesthesia Postprocedure Evaluation (Signed)
Anesthesia Post Note  Patient: [name]  Procedure(s) Performed: COLONOSCOPY WITH PROPOFOL (N/A )  Patient location during evaluation: PACU Anesthesia Type: General Level of consciousness: awake and alert and oriented Pain management: pain level controlled Vital Signs Assessment: post-procedure vital signs reviewed and stable Respiratory status: spontaneous breathing Cardiovascular status: blood pressure returned to baseline and stable Postop Assessment: no apparent nausea or vomiting Anesthetic complications: no     Last Vitals:  Vitals:   01/14/20 1150 01/14/20 1337  BP: (!) 176/70 127/60  Pulse: (!) 53 (!) 58  Resp: 19 (!) 24  Temp: 37 C (P) 36.7 C  SpO2: 98% 100%    Last Pain:  Vitals:   01/14/20 1300  TempSrc:   PainSc: 0-No pain                 Ahilyn Nell

## 2020-01-14 NOTE — Discharge Instructions (Signed)
Resume usual medications including aspirin and clopidogrel as before. Modified carb high-fiber diet. No driving for 24 hours.   Colonoscopy, Adult, Care After This sheet gives you information about how to care for yourself after your procedure. Your health care provider may also give you more specific instructions. If you have problems or questions, contact your health care provider. What can I expect after the procedure? After the procedure, it is common to have:  A small amount of blood in your stool for 24 hours after the procedure.  Some gas.  Mild cramping or bloating of your abdomen. Follow these instructions at home: Eating and drinking   Drink enough fluid to keep your urine pale yellow.  Follow instructions from your health care provider about eating or drinking restrictions.  Resume your normal diet as instructed by your health care provider. Avoid heavy or fried foods that are hard to digest. Activity  Rest as told by your health care provider.  Avoid sitting for a long time without moving. Get up to take short walks every 1-2 hours. This is important to improve blood flow and breathing. Ask for help if you feel weak or unsteady.  Return to your normal activities as told by your health care provider. Ask your health care provider what activities are safe for you. Managing cramping and bloating   Try walking around when you have cramps or feel bloated.  Apply heat to your abdomen as told by your health care provider. Use the heat source that your health care provider recommends, such as a moist heat pack or a heating pad. ? Place a towel between your skin and the heat source. ? Leave the heat on for 20-30 minutes. ? Remove the heat if your skin turns bright red. This is especially important if you are unable to feel pain, heat, or cold. You may have a greater risk of getting burned. General instructions  For the first 24 hours after the procedure: ? Do not drive  or use machinery. ? Do not sign important documents. ? Do not drink alcohol. ? Do your regular daily activities at a slower pace than normal. ? Eat soft foods that are easy to digest.  Take over-the-counter and prescription medicines only as told by your health care provider.  Keep all follow-up visits as told by your health care provider. This is important. Contact a health care provider if:  You have blood in your stool 2-3 days after the procedure. Get help right away if you have:  More than a small spotting of blood in your stool.  Large blood clots in your stool.  Swelling of your abdomen.  Nausea or vomiting.  A fever.  Increasing pain in your abdomen that is not relieved with medicine. Summary  After the procedure, it is common to have a small amount of blood in your stool. You may also have mild cramping and bloating of your abdomen.  For the first 24 hours after the procedure, do not drive or use machinery, sign important documents, or drink alcohol.  Get help right away if you have a lot of blood in your stool, nausea or vomiting, a fever, or increased pain in your abdomen. This information is not intended to replace advice given to you by your health care provider. Make sure you discuss any questions you have with your health care provider. Document Revised: 06/14/2019 Document Reviewed: 06/14/2019 Elsevier Patient Education  [place].

## 2020-01-14 NOTE — Anesthesia Preprocedure Evaluation (Addendum)
Anesthesia Evaluation  Patient identified by MRN, date of birth, ID band Patient awake    Reviewed: Allergy & Precautions, NPO status , Patient's Chart, lab work & pertinent test results  History of Anesthesia Complications Negative for: history of anesthetic complications  Airway Mallampati: II  TM Distance: >3 FB Neck ROM: Full    Dental  (+) Partial Upper, Missing, Dental Advisory Given   Pulmonary neg pulmonary ROS,    Pulmonary exam normal breath sounds clear to auscultation       Cardiovascular Exercise Tolerance: Good hypertension, Pt. on medications + CAD and + Cardiac Stents  Normal cardiovascular exam Rhythm:Regular Rate:Normal  Sinus  Bradycardia  Low voltage in precordial leads.   -  Nonspecific T-abnormality.  ABNORMAL     Neuro/Psych negative neurological ROS  negative psych ROS   GI/Hepatic Neg liver ROS, neg GERD (h/o indigestion - used to take tums)  ,  Endo/Other  diabetes, Well Controlled, Type 2, Oral Hypoglycemic Agents  Renal/GU   negative genitourinary   Musculoskeletal  (+) Arthritis , Osteoarthritis,    Abdominal   Peds negative pediatric ROS (+)  Hematology   Anesthesia Other Findings   Reproductive/Obstetrics                            Anesthesia Physical Anesthesia Plan  ASA: III  Anesthesia Plan: General   Post-op Pain Management:    Induction: Intravenous  PONV Risk Score and Plan: 2 and TIVA  Airway Management Planned: Nasal Cannula, Natural Airway and Simple Face Mask  Additional Equipment:   Intra-op Plan:   Post-operative Plan:   Informed Consent: I have reviewed the patients History and Physical, chart, labs and discussed the procedure including the risks, benefits and alternatives for the proposed anesthesia with the patient or authorized representative who has indicated his/her understanding and acceptance.     Dental advisory  given  Plan Discussed with: CRNA and Surgeon  Anesthesia Plan Comments:         Anesthesia Quick Evaluation

## 2020-01-14 NOTE — Transfer of Care (Signed)
Immediate Anesthesia Transfer of Care Note  Patient: [name]  Procedure(s) Performed: COLONOSCOPY WITH PROPOFOL (N/A )  Patient Location: PACU  Anesthesia Type:General  Level of Consciousness: awake  Airway & Oxygen Therapy: Patient Spontanous Breathing  Post-op Assessment: Report given to RN  Post vital signs: Reviewed  Last Vitals:  Vitals Value Taken Time  BP 127/60 01/14/20 1337  Temp    Pulse 59 01/14/20 1342  Resp 12 01/14/20 1342  SpO2 99 % 01/14/20 1342  Vitals shown include unvalidated device data.  Last Pain:  Vitals:   01/14/20 1300  TempSrc:   PainSc: 0-No pain         Complications: No apparent anesthesia complications

## 2020-01-14 NOTE — Op Note (Signed)
[facility] Patient Name: [name] Procedure Date: 01/14/2020 12:44 PM MRN: [id number] Date of Birth: 01-Sep-1946 Attending MD: Hildred Laser , MD CSN: [id number] Age: 74 Admit Type: Outpatient Procedure:                Colonoscopy Indications:              Abnormal CT of the GI tract Providers:                Hildred Laser, MD, Otis Peak B. Sharon Seller, RN, Randa Spike, Technician Referring MD:             Redmond School, MD Medicines:                Propofol per Anesthesia Complications:            No immediate complications. Estimated Blood Loss:     Estimated blood loss: none. Procedure:                Pre-Anesthesia Assessment:                           - Prior to the procedure, a History and Physical                            was performed, and patient medications and                            allergies were reviewed. The patient's tolerance of                            previous anesthesia was also reviewed. The risks                            and benefits of the procedure and the sedation                            options and risks were discussed with the patient.                            All questions were answered, and informed consent                            was obtained. Prior Anticoagulants: The patient                            last took Plavix (clopidogrel) 5 days and                            antiplatelet medication 1 day prior to the                            procedure. ASA Grade Assessment: III - A patient  with severe systemic disease. After reviewing the                            risks and benefits, the patient was deemed in                            satisfactory condition to undergo the procedure.                           After obtaining informed consent, the colonoscope                            was passed under direct vision. Throughout the                            procedure, the  patient's blood pressure, pulse, and                            oxygen saturations were monitored continuously. The                            [id number] was introduced through the                            anus and advanced to the the terminal ileum, with                            identification of the appendiceal orifice and IC                            valve. The colonoscopy was performed without                            difficulty. The patient tolerated the procedure                            well. The quality of the bowel preparation was                            excellent. The terminal ileum, ileocecal valve,                            appendiceal orifice, and rectum were photographed. Scope In: 1:06:54 PM Scope Out: 1:27:44 PM Scope Withdrawal Time: 0 hours 12 minutes 19 seconds  Total Procedure Duration: 0 hours 20 minutes 50 seconds  Findings:      The perianal and digital rectal examinations were normal.      The terminal ileum appeared normal.      A few diverticula were found in the sigmoid colon.      The exam was otherwise normal throughout the examined colon.      The retroflexed view of the distal rectum and anal verge was normal and       showed no anal or rectal abnormalities. Impression:               -  The examined portion of the ileum was normal.                           - Diverticulosis in the sigmoid colon.                           - No specimens collected. Moderate Sedation:      Per Anesthesia Care Recommendation:           - Patient has a contact number available for                            emergencies. The signs and symptoms of potential                            delayed complications were discussed with the                            patient. Return to normal activities tomorrow.                            Written discharge instructions were provided to the                            patient.                           - High fiber  diet and diabetic (ADA) diet today.                           - Continue present medications.                           - Resume Plavix (clopidogrel) today and previous                            antiplatelet medication today at prior doses.                           - Return to primary care physician.                           - No repeat colonoscopy due to age and the absence                            of advanced adenomas. Procedure Code(s):        --- Professional ---                           [phone], Colonoscopy, flexible; diagnostic, including                            collection of specimen(s) by brushing or washing,                            when performed (separate procedure) Diagnosis  Code(s):        --- Professional ---                           K57.30, Diverticulosis of large intestine without                            perforation or abscess without bleeding                           R93.3, Abnormal findings on diagnostic imaging of                            other parts of digestive tract CPT copyright 2019 American Medical Association. All rights reserved. The codes documented in this report are preliminary and upon coder review may  be revised to meet current compliance requirements. Hildred Laser, MD Hildred Laser, MD 01/14/2020 1:39:57 PM This report has been signed electronically. Number of Addenda: 0

## 2020-01-30 DIAGNOSIS — E11319 Type 2 diabetes mellitus with unspecified diabetic retinopathy without macular edema: Secondary | ICD-10-CM | POA: Diagnosis not present

## 2020-01-30 DIAGNOSIS — I10 Essential (primary) hypertension: Secondary | ICD-10-CM | POA: Diagnosis not present

## 2020-01-30 DIAGNOSIS — I2510 Atherosclerotic heart disease of native coronary artery without angina pectoris: Secondary | ICD-10-CM | POA: Diagnosis not present

## 2020-01-30 DIAGNOSIS — E113293 Type 2 diabetes mellitus with mild nonproliferative diabetic retinopathy without macular edema, bilateral: Secondary | ICD-10-CM | POA: Diagnosis not present

## 2020-02-07 DIAGNOSIS — E113293 Type 2 diabetes mellitus with mild nonproliferative diabetic retinopathy without macular edema, bilateral: Secondary | ICD-10-CM | POA: Diagnosis not present

## 2020-02-08 DIAGNOSIS — R109 Unspecified abdominal pain: Secondary | ICD-10-CM | POA: Diagnosis not present

## 2020-02-18 DIAGNOSIS — Z1389 Encounter for screening for other disorder: Secondary | ICD-10-CM | POA: Diagnosis not present

## 2020-02-18 DIAGNOSIS — E119 Type 2 diabetes mellitus without complications: Secondary | ICD-10-CM | POA: Diagnosis not present

## 2020-02-18 DIAGNOSIS — E7849 Other hyperlipidemia: Secondary | ICD-10-CM | POA: Diagnosis not present

## 2020-02-18 DIAGNOSIS — Z0001 Encounter for general adult medical examination with abnormal findings: Secondary | ICD-10-CM | POA: Diagnosis not present

## 2020-02-18 DIAGNOSIS — R109 Unspecified abdominal pain: Secondary | ICD-10-CM | POA: Diagnosis not present

## 2020-02-18 DIAGNOSIS — I10 Essential (primary) hypertension: Secondary | ICD-10-CM | POA: Diagnosis not present

## 2020-02-18 DIAGNOSIS — I2510 Atherosclerotic heart disease of native coronary artery without angina pectoris: Secondary | ICD-10-CM | POA: Diagnosis not present

## 2020-03-20 ENCOUNTER — Ambulatory Visit: Payer: Medicare Other | Admitting: Urology

## 2020-03-27 ENCOUNTER — Other Ambulatory Visit: Payer: Self-pay

## 2020-03-27 ENCOUNTER — Encounter: Payer: Self-pay | Admitting: Urology

## 2020-03-27 ENCOUNTER — Ambulatory Visit: Payer: Medicare Other | Admitting: Urology

## 2020-03-27 VITALS — Temp 97.9°F | Ht 65.5 in | Wt 173.0 lb

## 2020-03-27 DIAGNOSIS — N2889 Other specified disorders of kidney and ureter: Secondary | ICD-10-CM | POA: Diagnosis not present

## 2020-03-27 LAB — POCT URINALYSIS DIPSTICK
Bilirubin, UA: NEGATIVE
Blood, UA: NEGATIVE
Glucose, UA: NEGATIVE
Ketones, UA: NEGATIVE
Nitrite, UA: POSITIVE
Protein, UA: POSITIVE — AB
Spec Grav, UA: 1.025 (ref 1.010–1.025)
Urobilinogen, UA: 0.2 E.U./dL
pH, UA: 6 (ref 5.0–8.0)

## 2020-03-27 NOTE — Progress Notes (Signed)
Urological Symptom Review  Patient is experiencing the following symptoms: Trouble starting stream  Groin pain Rt leg pain   Review of Systems  Gastrointestinal (upper)  : Negative for upper GI symptoms  Gastrointestinal (lower) : Negative for lower GI symptoms  Constitutional : Night Sweats  Skin: Itching  Eyes: Negative for eye symptoms  Ear/Nose/Throat : Negative for Ear/Nose/Throat symptoms  Hematologic/Lymphatic: Negative for Hematologic/Lymphatic symptoms  Cardiovascular : Leg swelling  Respiratory : Negative for respiratory symptoms  Endocrine: Negative for endocrine symptoms  Musculoskeletal: Back pain Joint pain  Neurological: Headaches  Psychologic: Negative for psychiatric symptoms

## 2020-03-27 NOTE — Progress Notes (Signed)
03/27/2020 9:14 AM   [name] 1946/05/27 [id number]  Referring provider: Redmond School, MD [address] [place],  [place] [address]  Right renal mass  HPI: [name] is 74yo here for evaluation of a right renal mass. SHe had a CT 1/27 for abdominal pain and was found to have a 1.4cm right medial mass/complex cyst. NO hematuria. No LUTS. She has intermittent right flank pain. No family hx of renal masses. She is on plavix.  Creatinine is 0.8 in 01/2020. PMH: Past Medical History:  Diagnosis Date  . Arthritis   . CAD (coronary artery disease)   . Cancer ([place])   . Diabetes mellitus ([place])    type 2 x  1 or 2 yrs  . Fatty liver   . GERD (gastroesophageal reflux disease)   . High cholesterol   . Hypertension   . Seasonal allergies     Surgical History: Past Surgical History:  Procedure Laterality Date  . CARDIAC CATHETERIZATION  09/11/2007   100% occlusion of the left circumflex. proceed with stenting  . CARDIAC CATHETERIZATION  09/11/2007   mid circumflex occlusion with tandem 80% proximal LAD lesion to the 50% mid LAD lesion. nonDES stenting of the circumflex occlusion after recanalization and balloon dilatation, the stent was a 2.75x42mm Liberte, TIMI3 flow was restored  . CARDIOVASCULAR STRESS TEST  03/24/2012   normal pattern of perfusion in all regions, no scintigraphic evidence of inducible myocardial ischemia  . CHOLECYSTECTOMY  2008  . COLONOSCOPY  06/17/2012   Procedure: COLONOSCOPY;  Surgeon: Rogene Houston, MD;  [facility];  Service: Endoscopy;  Laterality: N/A;  100  . COLONOSCOPY WITH PROPOFOL N/A 01/14/2020   Procedure: COLONOSCOPY WITH PROPOFOL;  Surgeon: Rogene Houston, MD;  [facility];  Service: Endoscopy;  Laterality: N/A;  135  . DOPPLER ECHOCARDIOGRAPHY  07/20/2012   EF 60-65%, wall motion normal, there were no regional wall motion abnormalities  . Left breast lumpectomy    . LOWER EXTREMITY VENOUS DOPPLER Left  12/27/2011   no evidence of deep vein thrombosis involving the left lower extremity and right common femoral vein, enlarged inguinal lymph node is visualized on the left. no baker's cyst on the left.  Marland Kitchen PARTIAL HYSTERECTOMY      Home Medications:  Allergies as of 03/27/2020   No Known Allergies     Medication List       Accurate as of March 27, 2020  9:14 AM. If you have any questions, ask your nurse or doctor.        amLODipine 5 MG tablet Commonly known as: NORVASC Take 1 tablet (5 mg total) by mouth daily.   aspirin EC 81 MG tablet Take 81 mg by mouth every morning.   clopidogrel 75 MG tablet Commonly known as: PLAVIX Take 1 tablet (75 mg total) by mouth every morning.   ibuprofen 600 MG tablet Commonly known as: ADVIL Take 600 mg by mouth 3 (three) times daily.   Janumet XR [phone] MG Tb24 Generic drug: SitaGLIPtin-MetFORMIN HCl Take by mouth daily.   lisinopril 20 MG tablet Commonly known as: ZESTRIL Take 20 mg by mouth 2 (two) times daily.   nitroGLYCERIN 0.4 MG SL tablet Commonly known as: NITROSTAT Place 1 tablet (0.4 mg total) under the tongue every 5 (five) minutes as needed for chest pain.   rosuvastatin 10 MG tablet Commonly known as: CRESTOR Take 10 mg by mouth daily.   SYSTANE OP Apply 1 drop to eye 2 (  two) times daily.   Vitamin D 125 MCG (5000 UT) Caps Take by mouth daily.       Allergies: No Known Allergies  Family History: Family History  Problem Relation Age of Onset  . Heart disease Mother   . Cancer Mother        Breast cancer    Social History:  reports that she has never smoked. She has never used smokeless tobacco. She reports that she does not drink alcohol or use drugs.  ROS: All other review of systems were reviewed and are negative except what is noted above in HPI  Physical Exam: Temp 97.9 F (36.6 C)   Ht 5' 5.5" (1.664 m)   Wt 173 lb (78.5 kg)   BMI 28.35 kg/m   Constitutional:  Alert and oriented, No acute  distress. HEENT: [place] AT, moist mucus membranes.  Trachea midline, no masses. Cardiovascular: No clubbing, cyanosis, or edema. Respiratory: Normal respiratory effort, no increased work of breathing. GI: Abdomen is soft, nontender, nondistended, no abdominal masses GU: No CVA tenderness. Lymph: No cervical or inguinal lymphadenopathy. Skin: No rashes, bruises or suspicious lesions. Neurologic: Grossly intact, no focal deficits, moving all 4 extremities. Psychiatric: Normal mood and affect.  Laboratory Data: Lab Results  Component Value Date   WBC 6.7 05/11/2013   HGB 13.3 05/11/2013   HCT 39.9 05/11/2013   MCV 84.4 05/11/2013   PLT 210 05/11/2013    Lab Results  Component Value Date   CREATININE 0.80 01/12/2020    No results found for: PSA  No results found for: TESTOSTERONE  Lab Results  Component Value Date   HGBA1C (H) 09/12/2007    6.3 (NOTE)  Therapeutic target for the treatment of diabetes mellitus patients is <7% HbA1c.  American Diabetes Association Diabetes Care 2002;25:S33-S49.    Urinalysis    Component Value Date/Time   BILIRUBINUR neg 03/27/2020 0841   PROTEINUR Positive (A) 03/27/2020 0841   UROBILINOGEN 0.2 03/27/2020 0841   NITRITE positive 03/27/2020 0841   LEUKOCYTESUR Moderate (2+) (A) 03/27/2020 0841    No results found for: LABMICR, WBCUA, RBCUA, LABEPIT, MUCUS, BACTERIA  Pertinent Imaging: CT 1/27: Images reviewed and discussed with patient. No results found for this or any previous visit. No results found for this or any previous visit. No results found for this or any previous visit. No results found for this or any previous visit. No results found for this or any previous visit. No results found for this or any previous visit. No results found for this or any previous visit. No results found for this or any previous visit.  Assessment & Plan:    1. Renal mass -We discussed the natural hx of small renal masses and the workup  including dedicated abdominal imaging. We will obtain MRI - POCT urinalysis dipstick   Return in about 4 weeks (around 04/24/2020) for MRI.  Nicolette Bang, MD  [facility] [place]  ew

## 2020-03-27 NOTE — Patient Instructions (Signed)
Renal Mass  A renal mass is a growth in the kidney. A renal mass may be found while performing an MRI, CT scan, or ultrasound for other problems of the abdomen. Certain types of cancers, infections, or injuries can cause a renal mass. A renal mass that is cancerous (malignant) may grow or spread quickly. Others are harmless (benign). What are common types of renal masses? Renal masses include:  Tumors. These may be cancerous (malignant) or noncancerous (benign). ? The most common type of kidney cancer is renal cell carcinoma. ? The most common benign tumors of the kidney include renal adenomas, oncocytomas, and angiomyolipoma (AML).  Cysts. These are fluid-filled sacs that form on or in the kidney. ? It is not always known what causes a cyst to develop in or on the kidney. ? Most kidney cysts do not cause symptoms and do not need to be treated. What type of testing might I need? Your health care provider may recommend that you have tests to diagnose the cause of your renal mass. The following tests may be done if a renal mass is found:  Physical exam.  Blood tests.  Urine tests.  Imaging tests, such as ultrasound, CT scan, or MRI.  Biopsy. This is a small sample that is removed from the renal mass and tested in a lab. The exact tests and how often they are done will depend on:  The size and appearance of the renal mass.  Risk factors or medical conditions that increase your risk for problems.  Any symptoms associated with the renal mass, or concerns that you have about it. Tests and physical exams may be done once, or they may be done regularly for a period of time. Tests and exams that are done regularly will help monitor whether the mass is growing and beginning to cause problems. What are common treatments for renal masses? Treatment is not always needed for this condition. Your health care provider may recommend careful monitoring (watchful waiting) and regular tests and exams.  Treatment will depend on the cause of the mass. Follow these instructions at home: What you need to do at home will depend on the cause of the mass. Follow the instructions that your health care provider gives to you. In general:  Take over-the-counter and prescription medicines only as told by your health care provider.  If you are prescribed an antibiotic medicine, take it as told by your health care provider. Do not stop taking the antibiotic even if you start to feel better.  Follow any restrictions that are given to you by your health care provider.  Keep all follow-up visits as told by your health care provider. This is important. ? You may need to see your health care provider once or twice a year to have CT scans and ultrasounds done. These tests will show if your renal mass has changed or grown bigger. Contact a health care provider if you:  Have pain in the side or back (flank pain).  Have a fever.  Feel full soon after eating.  Have pain or swelling in the abdomen.  Lose weight. Get help right away if:  Your pain gets worse.  There is blood in your urine.  You cannot urinate.  You have chest pain.  You have trouble breathing. Summary  A renal mass is a growth in the kidney. It may be cancerous (malignant) and grow or spread quickly, or it may be harmless (benign).  Renal masses may be found while performing   an MRI, CT scan, or ultrasound for other problems of the abdomen.  Your health care provider may recommend that you have tests to diagnose the cause of your renal mass. This may include a physical exam, blood tests, urine tests, imaging, or a biopsy.  Treatment is not always needed for this condition. Careful monitoring (watchful waiting) may be recommended. This information is not intended to replace advice given to you by your health care provider. Make sure you discuss any questions you have with your health care provider. Document Revised: 12/25/2017  Document Reviewed: 12/25/2017 Elsevier Patient Education  2020 Elsevier Inc.  

## 2020-03-31 DIAGNOSIS — E11319 Type 2 diabetes mellitus with unspecified diabetic retinopathy without macular edema: Secondary | ICD-10-CM | POA: Diagnosis not present

## 2020-03-31 DIAGNOSIS — I2510 Atherosclerotic heart disease of native coronary artery without angina pectoris: Secondary | ICD-10-CM | POA: Diagnosis not present

## 2020-03-31 DIAGNOSIS — E113293 Type 2 diabetes mellitus with mild nonproliferative diabetic retinopathy without macular edema, bilateral: Secondary | ICD-10-CM | POA: Diagnosis not present

## 2020-03-31 DIAGNOSIS — I10 Essential (primary) hypertension: Secondary | ICD-10-CM | POA: Diagnosis not present

## 2020-03-31 DIAGNOSIS — E7849 Other hyperlipidemia: Secondary | ICD-10-CM | POA: Diagnosis not present

## 2020-04-08 ENCOUNTER — Ambulatory Visit: Payer: Medicare Other | Attending: Internal Medicine

## 2020-04-08 DIAGNOSIS — Z23 Encounter for immunization: Secondary | ICD-10-CM

## 2020-04-08 NOTE — Progress Notes (Signed)
   Covid-19 Vaccination Clinic  Name:  [name]    MRN: [id number] DOB: 08-11-1946  04/08/2020  [name] was observed post Covid-19 immunization for 30 minutes based on pre-vaccination screening without incident. She was provided with Vaccine Information Sheet and instruction to access the V-Safe system.   [name] was instructed to call 911 with any severe reactions post vaccine: Marland Kitchen Difficulty breathing  . Swelling of face and throat  . A fast heartbeat  . A bad rash all over body  . Dizziness and weakness   Immunizations Administered    Name Date Dose VIS Date Route   Pfizer COVID-19 Vaccine 04/08/2020  9:23 AM 0.3 mL 01/26/2019 Intramuscular   Manufacturer: [place]   Lot: [id number]   [place]: [id number]

## 2020-04-10 ENCOUNTER — Other Ambulatory Visit: Payer: Self-pay

## 2020-04-10 ENCOUNTER — Telehealth: Payer: Self-pay

## 2020-04-10 ENCOUNTER — Other Ambulatory Visit: Payer: Self-pay | Admitting: Urology

## 2020-04-10 DIAGNOSIS — N2889 Other specified disorders of kidney and ureter: Secondary | ICD-10-CM

## 2020-04-10 MED ORDER — DIAZEPAM 10 MG PO TABS: 10.0000 mg | ORAL_TABLET | Freq: Once | ORAL | 0 refills | Status: AC

## 2020-04-10 MED ORDER — DIAZEPAM 10 MG PO TABS: 10.0000 mg | ORAL_TABLET | Freq: Once | ORAL | 0 refills | Status: DC

## 2020-04-10 NOTE — Telephone Encounter (Signed)
Pt states she has anxiety over MRI machine. Pt is asking for medication to help with that.

## 2020-04-26 ENCOUNTER — Ambulatory Visit: Payer: Medicare Other | Admitting: Urology

## 2020-04-28 ENCOUNTER — Other Ambulatory Visit: Payer: Self-pay

## 2020-04-28 ENCOUNTER — Ambulatory Visit (HOSPITAL_COMMUNITY)
Admission: RE | Admit: 2020-04-28 | Discharge: 2020-04-28 | Disposition: A | Discharge status: Home / Self Care | Payer: Medicare Other | Source: Ambulatory Visit | Attending: Urology | Admitting: Urology

## 2020-04-28 DIAGNOSIS — N2889 Other specified disorders of kidney and ureter: Secondary | ICD-10-CM | POA: Diagnosis not present

## 2020-04-28 DIAGNOSIS — N281 Cyst of kidney, acquired: Secondary | ICD-10-CM | POA: Diagnosis not present

## 2020-04-28 MED ORDER — GADOBUTROL 1 MMOL/ML IV SOLN
7.0000 mL | Freq: Once | INTRAVENOUS | Status: AC | PRN
  Administered 2020-04-28: 7 mL via INTRAVENOUS

## 2020-05-03 ENCOUNTER — Ambulatory Visit (INDEPENDENT_AMBULATORY_CARE_PROVIDER_SITE_OTHER): Payer: Medicare Other | Admitting: Urology

## 2020-05-03 ENCOUNTER — Other Ambulatory Visit: Payer: Self-pay

## 2020-05-03 ENCOUNTER — Encounter: Payer: Self-pay | Admitting: Urology

## 2020-05-03 VITALS — BP 152/82 | HR 62 | Temp 97.9°F | Ht 65.0 in | Wt 173.0 lb

## 2020-05-03 DIAGNOSIS — N2889 Other specified disorders of kidney and ureter: Secondary | ICD-10-CM | POA: Diagnosis not present

## 2020-05-03 NOTE — Progress Notes (Signed)
Urological Symptom Review  Patient is experiencing the following symptoms: none   Review of Systems  Gastrointestinal (upper)  : Negative for upper GI symptoms  Gastrointestinal (lower) : Negative for lower GI symptoms  Constitutional : Negative for symptoms  Skin: Negative for skin symptoms  Eyes: Negative for eye symptoms  Ear/Nose/Throat :  Negative for ear/nose/throat   Hematologic/Lymphatic: Negative for Hematologic/Lymphatic symptoms  Cardiovascular : Negative for cardiovascular symptoms  Respiratory : Negative for respiratory symptoms  Endocrine: Negative for endocrine symptoms  Musculoskeletal: Back pain  Neurological: Negative for neurological symptoms  Psychologic: Negative for psychiatric symptoms

## 2020-05-03 NOTE — Progress Notes (Signed)
05/03/2020 3:25 PM   [name] 1946/04/29 [id number]  Referring provider: Redmond School, MD [address] [place],  [place] [address]  Right renal mass  HPI: [name] is a 74yo here for followup for a right renal mass. She underwent MRI 5/28 which showed a right 1.4cm simple renal cyst. She has mild lower back pain which is chronic. No hematuria   PMH: Past Medical History:  Diagnosis Date  . Arthritis   . CAD (coronary artery disease)   . Cancer ([place])   . Diabetes mellitus ([place])    type 2 x  1 or 2 yrs  . Fatty liver   . GERD (gastroesophageal reflux disease)   . High cholesterol   . Hypertension   . Seasonal allergies     Surgical History: Past Surgical History:  Procedure Laterality Date  . CARDIAC CATHETERIZATION  09/11/2007   100% occlusion of the left circumflex. proceed with stenting  . CARDIAC CATHETERIZATION  09/11/2007   mid circumflex occlusion with tandem 80% proximal LAD lesion to the 50% mid LAD lesion. nonDES stenting of the circumflex occlusion after recanalization and balloon dilatation, the stent was a 2.75x64mm Liberte, TIMI3 flow was restored  . CARDIOVASCULAR STRESS TEST  03/24/2012   normal pattern of perfusion in all regions, no scintigraphic evidence of inducible myocardial ischemia  . CHOLECYSTECTOMY  2008  . COLONOSCOPY  06/17/2012   Procedure: COLONOSCOPY;  Surgeon: Rogene Houston, MD;  [facility];  Service: Endoscopy;  Laterality: N/A;  100  . COLONOSCOPY WITH PROPOFOL N/A 01/14/2020   Procedure: COLONOSCOPY WITH PROPOFOL;  Surgeon: Rogene Houston, MD;  [facility];  Service: Endoscopy;  Laterality: N/A;  135  . DOPPLER ECHOCARDIOGRAPHY  07/20/2012   EF 60-65%, wall motion normal, there were no regional wall motion abnormalities  . Left breast lumpectomy    . LOWER EXTREMITY VENOUS DOPPLER Left 12/27/2011   no evidence of deep vein thrombosis involving the left lower extremity and right common femoral vein,  enlarged inguinal lymph node is visualized on the left. no baker's cyst on the left.  Marland Kitchen PARTIAL HYSTERECTOMY      Home Medications:  Allergies as of 05/03/2020   No Known Allergies     Medication List       Accurate as of May 03, 2020  3:25 PM. If you have any questions, ask your nurse or doctor.        amLODipine 5 MG tablet Commonly known as: NORVASC Take 1 tablet (5 mg total) by mouth daily.   aspirin EC 81 MG tablet Take 81 mg by mouth every morning.   clopidogrel 75 MG tablet Commonly known as: PLAVIX Take 1 tablet (75 mg total) by mouth every morning.   diazepam 10 MG tablet Commonly known as: VALIUM Take 10 mg by mouth once.   ibuprofen 600 MG tablet Commonly known as: ADVIL Take 600 mg by mouth 3 (three) times daily.   Janumet XR [phone] MG Tb24 Generic drug: SitaGLIPtin-MetFORMIN HCl Take by mouth daily.   lisinopril 20 MG tablet Commonly known as: ZESTRIL Take 20 mg by mouth 2 (two) times daily.   nitroGLYCERIN 0.4 MG SL tablet Commonly known as: NITROSTAT Place 1 tablet (0.4 mg total) under the tongue every 5 (five) minutes as needed for chest pain.   rosuvastatin 10 MG tablet Commonly known as: CRESTOR Take 10 mg by mouth daily.   SYSTANE OP Apply 1 drop to eye 2 (two) times daily.  Vitamin D 125 MCG (5000 UT) Caps Take by mouth daily.       Allergies: No Known Allergies  Family History: Family History  Problem Relation Age of Onset  . Heart disease Mother   . Cancer Mother        Breast cancer    Social History:  reports that she has never smoked. She has never used smokeless tobacco. She reports that she does not drink alcohol or use drugs.  ROS: All other review of systems were reviewed and are negative except what is noted above in HPI  Physical Exam: BP (!) 152/82   Pulse 62   Temp 97.9 F (36.6 C)   Ht 5\' 5"  (1.651 m)   Wt 173 lb (78.5 kg)   BMI 28.79 kg/m   Constitutional:  Alert and oriented, No acute  distress. HEENT: [place] AT, moist mucus membranes.  Trachea midline, no masses. Cardiovascular: No clubbing, cyanosis, or edema. Respiratory: Normal respiratory effort, no increased work of breathing. GI: Abdomen is soft, nontender, nondistended, no abdominal masses GU: No CVA tenderness.  Lymph: No cervical or inguinal lymphadenopathy. Skin: No rashes, bruises or suspicious lesions. Neurologic: Grossly intact, no focal deficits, moving all 4 extremities. Psychiatric: Normal mood and affect.  Laboratory Data: Lab Results  Component Value Date   WBC 6.7 05/11/2013   HGB 13.3 05/11/2013   HCT 39.9 05/11/2013   MCV 84.4 05/11/2013   PLT 210 05/11/2013    Lab Results  Component Value Date   CREATININE 0.80 01/12/2020    No results found for: PSA  No results found for: TESTOSTERONE  Lab Results  Component Value Date   HGBA1C (H) 09/12/2007    6.3 (NOTE)  Therapeutic target for the treatment of diabetes mellitus patients is <7% HbA1c.  American Diabetes Association Diabetes Care 2002;25:S33-S49.    Urinalysis    Component Value Date/Time   BILIRUBINUR neg 03/27/2020 0841   PROTEINUR Positive (A) 03/27/2020 0841   UROBILINOGEN 0.2 03/27/2020 0841   NITRITE positive 03/27/2020 0841   LEUKOCYTESUR Moderate (2+) (A) 03/27/2020 0841    No results found for: LABMICR, WBCUA, RBCUA, LABEPIT, MUCUS, BACTERIA  Pertinent Imaging: MRI 04/28/2020: Images reviewed and discussed with the patient No results found for this or any previous visit. No results found for this or any previous visit. No results found for this or any previous visit. No results found for this or any previous visit. No results found for this or any previous visit. No results found for this or any previous visit. No results found for this or any previous visit. No results found for this or any previous visit.  Assessment & Plan:    1. Renal cysts -We discussed the benign nature of these cysts. She does not  require additional imaging of theses cysts  No follow-ups on file.  Nicolette Bang, MD  [facility] [place]

## 2020-05-03 NOTE — Patient Instructions (Signed)
Renal Mass  A renal mass is a growth in the kidney. A renal mass may be found while performing an MRI, CT scan, or ultrasound for other problems of the abdomen. Certain types of cancers, infections, or injuries can cause a renal mass. A renal mass that is cancerous (malignant) may grow or spread quickly. Others are harmless (benign). What are common types of renal masses? Renal masses include:  Tumors. These may be cancerous (malignant) or noncancerous (benign). ? The most common type of kidney cancer is renal cell carcinoma. ? The most common benign tumors of the kidney include renal adenomas, oncocytomas, and angiomyolipoma (AML).  Cysts. These are fluid-filled sacs that form on or in the kidney. ? It is not always known what causes a cyst to develop in or on the kidney. ? Most kidney cysts do not cause symptoms and do not need to be treated. What type of testing might I need? Your health care provider may recommend that you have tests to diagnose the cause of your renal mass. The following tests may be done if a renal mass is found:  Physical exam.  Blood tests.  Urine tests.  Imaging tests, such as ultrasound, CT scan, or MRI.  Biopsy. This is a small sample that is removed from the renal mass and tested in a lab. The exact tests and how often they are done will depend on:  The size and appearance of the renal mass.  Risk factors or medical conditions that increase your risk for problems.  Any symptoms associated with the renal mass, or concerns that you have about it. Tests and physical exams may be done once, or they may be done regularly for a period of time. Tests and exams that are done regularly will help monitor whether the mass is growing and beginning to cause problems. What are common treatments for renal masses? Treatment is not always needed for this condition. Your health care provider may recommend careful monitoring (watchful waiting) and regular tests and exams.  Treatment will depend on the cause of the mass. Follow these instructions at home: What you need to do at home will depend on the cause of the mass. Follow the instructions that your health care provider gives to you. In general:  Take over-the-counter and prescription medicines only as told by your health care provider.  If you are prescribed an antibiotic medicine, take it as told by your health care provider. Do not stop taking the antibiotic even if you start to feel better.  Follow any restrictions that are given to you by your health care provider.  Keep all follow-up visits as told by your health care provider. This is important. ? You may need to see your health care provider once or twice a year to have CT scans and ultrasounds done. These tests will show if your renal mass has changed or grown bigger. Contact a health care provider if you:  Have pain in the side or back (flank pain).  Have a fever.  Feel full soon after eating.  Have pain or swelling in the abdomen.  Lose weight. Get help right away if:  Your pain gets worse.  There is blood in your urine.  You cannot urinate.  You have chest pain.  You have trouble breathing. Summary  A renal mass is a growth in the kidney. It may be cancerous (malignant) and grow or spread quickly, or it may be harmless (benign).  Renal masses may be found while performing   an MRI, CT scan, or ultrasound for other problems of the abdomen.  Your health care provider may recommend that you have tests to diagnose the cause of your renal mass. This may include a physical exam, blood tests, urine tests, imaging, or a biopsy.  Treatment is not always needed for this condition. Careful monitoring (watchful waiting) may be recommended. This information is not intended to replace advice given to you by your health care provider. Make sure you discuss any questions you have with your health care provider. Document Revised: 12/25/2017  Document Reviewed: 12/25/2017 Elsevier Patient Education  2020 Elsevier Inc.  

## 2020-05-08 ENCOUNTER — Emergency Department (HOSPITAL_COMMUNITY): Payer: Medicare HMO

## 2020-05-08 ENCOUNTER — Other Ambulatory Visit: Payer: Self-pay

## 2020-05-08 ENCOUNTER — Encounter (HOSPITAL_COMMUNITY): Payer: Self-pay | Admitting: *Deleted

## 2020-05-08 ENCOUNTER — Emergency Department (HOSPITAL_COMMUNITY)
Admission: EM | Admit: 2020-05-08 | Discharge: 2020-05-08 | Disposition: A | Discharge status: Home / Self Care | Payer: Medicare HMO | Attending: Emergency Medicine | Admitting: Emergency Medicine

## 2020-05-08 DIAGNOSIS — Z9861 Coronary angioplasty status: Secondary | ICD-10-CM | POA: Diagnosis not present

## 2020-05-08 DIAGNOSIS — I2510 Atherosclerotic heart disease of native coronary artery without angina pectoris: Secondary | ICD-10-CM | POA: Insufficient documentation

## 2020-05-08 DIAGNOSIS — E119 Type 2 diabetes mellitus without complications: Secondary | ICD-10-CM | POA: Insufficient documentation

## 2020-05-08 DIAGNOSIS — I10 Essential (primary) hypertension: Secondary | ICD-10-CM | POA: Diagnosis not present

## 2020-05-08 DIAGNOSIS — R072 Precordial pain: Secondary | ICD-10-CM

## 2020-05-08 DIAGNOSIS — R079 Chest pain, unspecified: Secondary | ICD-10-CM | POA: Diagnosis present

## 2020-05-08 LAB — CBC
HCT: 41.3 % (ref 36.0–46.0)
Hemoglobin: 13 g/dL (ref 12.0–15.0)
MCH: 28.5 pg (ref 26.0–34.0)
MCHC: 31.5 g/dL (ref 30.0–36.0)
MCV: 90.6 fL (ref 80.0–100.0)
Platelets: 218 10*3/uL (ref 150–400)
RBC: 4.56 MIL/uL (ref 3.87–5.11)
RDW: 12.4 % (ref 11.5–15.5)
WBC: 8 10*3/uL (ref 4.0–10.5)
nRBC: 0 % (ref 0.0–0.2)

## 2020-05-08 LAB — BASIC METABOLIC PANEL
Anion gap: 9 (ref 5–15)
BUN: 14 mg/dL (ref 8–23)
CO2: 26 mmol/L (ref 22–32)
Calcium: 9.4 mg/dL (ref 8.9–10.3)
Chloride: 101 mmol/L (ref 98–111)
Creatinine, Ser: 0.81 mg/dL (ref 0.44–1.00)
GFR calc Af Amer: >60 mL/min (ref >60)
GFR calc non Af Amer: >60 mL/min (ref >60)
Glucose, Bld: 180 mg/dL — ABNORMAL HIGH (ref 70–99)
Potassium: 4.1 mmol/L (ref 3.5–5.1)
Sodium: 136 mmol/L (ref 135–145)

## 2020-05-08 LAB — TROPONIN I (HIGH SENSITIVITY)
Troponin I (High Sensitivity): 3 ng/L (ref <18)
Troponin I (High Sensitivity): 3 ng/L (ref <18)

## 2020-05-08 MED ORDER — SODIUM CHLORIDE 0.9% FLUSH: 3.0000 mL | Freq: Once | INTRAVENOUS | Status: DC

## 2020-05-08 NOTE — ED Provider Notes (Signed)
Emergency Department Provider Note   I have reviewed the triage vital signs and the nursing notes.   HISTORY  Chief Complaint Chest Pain   HPI [name] is a 74 y.o. female with past medical history reviewed below including CAD with PCI in 2008 with bare-metal stenting followed by Dr. Bronson Ing as an outpatient presents to the emergency department with intermittent fluttering type discomfort in the center of the chest.  Patient tells me that this pain is very unlike the pain that she had in 2008 which led to her stent placement.  She has had it intermittently with no clear provoking factors.  She is not having active discomfort.  She states that today she had some fluttering in her chest which was brief and then developed some pressure.  She took a nitroglycerin and symptoms resolved immediately and have not returned.  No change with exertion.  No diaphoresis, nausea, vomiting.  Denies abdominal or back pain.  She is compliant with her home medications.  In 2008 she has had severe heaviness/crushing chest pain with radiation down the left arm which was not the case today.   Past Medical History:  Diagnosis Date  . Arthritis   . CAD (coronary artery disease)   . Cancer ([place])   . Diabetes mellitus ([place])    type 2 x  1 or 2 yrs  . Fatty liver   . GERD (gastroesophageal reflux disease)   . High cholesterol   . Hypertension   . Seasonal allergies     Patient Active Problem List   Diagnosis Date Noted  . Renal mass 03/27/2020  . Abnormal abdominal CT scan 01/10/2020  . Elevated liver enzymes 05/13/2013  . IBS (irritable bowel syndrome) 10/22/2012  . Chronic RLQ pain 05/27/2012  . Hypertension 05/27/2012  . High cholesterol 05/27/2012  . CAD (coronary artery disease) 05/27/2012    Past Surgical History:  Procedure Laterality Date  . CARDIAC CATHETERIZATION  09/11/2007   100% occlusion of the left circumflex. proceed with stenting  . CARDIAC CATHETERIZATION  09/11/2007     mid circumflex occlusion with tandem 80% proximal LAD lesion to the 50% mid LAD lesion. nonDES stenting of the circumflex occlusion after recanalization and balloon dilatation, the stent was a 2.75x58mm Liberte, TIMI3 flow was restored  . CARDIOVASCULAR STRESS TEST  03/24/2012   normal pattern of perfusion in all regions, no scintigraphic evidence of inducible myocardial ischemia  . CHOLECYSTECTOMY  2008  . COLONOSCOPY  06/17/2012   Procedure: COLONOSCOPY;  Surgeon: Rogene Houston, MD;  [facility];  Service: Endoscopy;  Laterality: N/A;  100  . COLONOSCOPY WITH PROPOFOL N/A 01/14/2020   Procedure: COLONOSCOPY WITH PROPOFOL;  Surgeon: Rogene Houston, MD;  Location: [facility];  Service: Endoscopy;  Laterality: N/A;  135  . DOPPLER ECHOCARDIOGRAPHY  07/20/2012   EF 60-65%, wall motion normal, there were no regional wall motion abnormalities  . Left breast lumpectomy    . LOWER EXTREMITY VENOUS DOPPLER Left 12/27/2011   no evidence of deep vein thrombosis involving the left lower extremity and right common femoral vein, enlarged inguinal lymph node is visualized on the left. no baker's cyst on the left.  Marland Kitchen PARTIAL HYSTERECTOMY      Allergies Patient has no known allergies.  Family History  Problem Relation Age of Onset  . Heart disease Mother   . Cancer Mother        Breast cancer    Social History Social History  Tobacco Use  . Smoking status: Never Smoker  . Smokeless tobacco: Never Used  Substance Use Topics  . Alcohol use: No  . Drug use: No    Review of Systems  Constitutional: No fever/chills Eyes: No visual changes. ENT: No sore throat. Cardiovascular: Positive chest pain. Respiratory: Denies shortness of breath. Gastrointestinal: No abdominal pain.  No nausea, no vomiting.  No diarrhea.  No constipation. Genitourinary: Negative for dysuria. Musculoskeletal: Negative for back pain. Skin: Negative for rash. Neurological: Negative for headaches,  focal weakness or numbness.  10-point ROS otherwise negative.  ____________________________________________   PHYSICAL EXAM:  VITAL SIGNS: ED Triage Vitals  Enc Vitals Group     BP 05/08/20 1652 (!) 167/63     Pulse Rate 05/08/20 1652 67     Resp --      Temp 05/08/20 1652 98.6 F (37 C)     Temp src --      SpO2 05/08/20 1652 97 %     Weight 05/08/20 1641 173 lb (78.5 kg)     Height 05/08/20 1641 5' 0.5" (1.537 m)   Constitutional: Alert and oriented. Well appearing and in no acute distress. Eyes: Conjunctivae are normal.  Head: Atraumatic. Nose: No congestion/rhinnorhea. Mouth/Throat: Mucous membranes are moist.   Neck: No stridor.  Cardiovascular: Sinus bradycardia. Good peripheral circulation. Grossly normal heart sounds.   Respiratory: Normal respiratory effort.  No retractions. Lungs CTAB. Gastrointestinal: No distention.  Musculoskeletal: No lower extremity tenderness with trace pitting edema in the B/L LEs. No gross deformities of extremities. Neurologic:  Normal speech and language.  Skin:  Skin is warm, dry and intact. No rash noted.  ____________________________________________   LABS (all labs ordered are listed, but only abnormal results are displayed)  Labs Reviewed  BASIC METABOLIC PANEL - Abnormal; Notable for the following components:      Result Value   Glucose, Bld 180 (*)    All other components within normal limits  CBC  TROPONIN I (HIGH SENSITIVITY)  TROPONIN I (HIGH SENSITIVITY)   ____________________________________________  EKG   EKG Interpretation  Date/Time:  Monday May 08 2020 16:42:20 EDT Ventricular Rate:  56 PR Interval:  142 QRS Duration: 74 QT Interval:  422 QTC Calculation: 407 R Axis:   11 Text Interpretation: Sinus bradycardia Otherwise normal ECG No STEMI Confirmed by Nanda Quinton [phone]) on 05/08/2020 5:14:04 PM       ____________________________________________  RADIOLOGY  DG Chest 2 View  Result Date:  05/08/2020 CLINICAL DATA:  Chest pain. EXAM: CHEST - 2 VIEW COMPARISON:  September 11, 2007 FINDINGS: There is no evidence of acute infiltrate, pleural effusion or pneumothorax. The heart size and mediastinal contours are within normal limits. Radiopaque surgical clips are seen overlying the right upper quadrant. The visualized skeletal structures are unremarkable. IMPRESSION: No active cardiopulmonary disease. Electronically Signed   By: Virgina Norfolk M.D.   On: 05/08/2020 17:16    ____________________________________________   PROCEDURES  Procedure(s) performed:   Procedures  None  ____________________________________________   INITIAL IMPRESSION / ASSESSMENT AND PLAN / ED COURSE  Pertinent labs & imaging results that were available during my care of the patient were reviewed by me and considered in my medical decision making (see chart for details).   Patient with past history of CAD and stenting in 2008 presents with atypical type chest pain intermittently over the past several weeks but recurred today.  No active pain.  Reviewed the patient's last cardiology visit with Dr. Bronson Ing.  EKG interpreted by  me with no acute ischemic change.  X-ray interpreted with no infiltrate or other acute process.  Patient had 2 high sensitivity troponins which were both 3.  While she does have CAD history her pain is atypical.  Doubt PE or other vascular process such as dissection or symptomatic aneurysm.  Plan for follow-up with her cardiology team.  Will provide contact information at discharge.  Discussed strict ED return precautions if symptoms return.   ____________________________________________  FINAL CLINICAL IMPRESSION(S) / ED DIAGNOSES  Final diagnoses:  Precordial chest pain    MEDICATIONS GIVEN DURING THIS VISIT:  Medications  sodium chloride flush (NS) 0.9 % injection 3 mL (3 mLs Intravenous Not Given 05/08/20 2059)    Note:  This document was prepared using Dragon voice  recognition software and may include unintentional dictation errors.  Nanda Quinton, MD, [facility]    Trinity Haun, W[facility], MD 05/08/20 2153

## 2020-05-08 NOTE — Discharge Instructions (Signed)
You were seen in the emergency department today with chest pain.  Your labs here are normal.  Please follow closely with both your cardiologist and primary care doctor.  I have listed their contact information in the paperwork here.  Please return to the emergency department and call 911 immediately if your chest pain returns or becomes more severe.

## 2020-05-08 NOTE — ED Triage Notes (Signed)
Pt with chest pains off and on for a month.  Mid chest, denies sob.

## 2020-05-11 ENCOUNTER — Ambulatory Visit: Payer: Medicare Other | Attending: Internal Medicine

## 2020-05-11 DIAGNOSIS — Z23 Encounter for immunization: Secondary | ICD-10-CM

## 2020-05-11 NOTE — Progress Notes (Signed)
   Covid-19 Vaccination Clinic  Name:  [name]    MRN: [id number] DOB: 12-25-1945  05/11/2020  [name] was observed post Covid-19 immunization for 15 minutes without incident. She was provided with Vaccine Information Sheet and instruction to access the V-Safe system.   [name] was instructed to call 911 with any severe reactions post vaccine: Marland Kitchen Difficulty breathing  . Swelling of face and throat  . A fast heartbeat  . A bad rash all over body  . Dizziness and weakness   Immunizations Administered    Name Date Dose VIS Date Route   Moderna COVID-19 Vaccine 04/08/2020  9:23 AM 0.5 mL 11/2019 Intramuscular   Manufacturer: Levan Hurst   Lot: [id number]   NDC: 35465-681-27      The vaccine was actually given on 04/08/20, incorrect documentation below note.

## 2020-05-11 NOTE — Progress Notes (Signed)
   Covid-19 Vaccination Clinic  Name:  [name]    MRN: [id number] DOB: [birth year]  05/11/2020  [name] was observed post Covid-19 immunization for 15 minutes without incident. She was provided with Vaccine Information Sheet and instruction to access the V-Safe system.   [name] was instructed to call 911 with any severe reactions post vaccine: Marland Kitchen Difficulty breathing  . Swelling of face and throat  . A fast heartbeat  . A bad rash all over body  . Dizziness and weakness   Immunizations Administered    Name Date Dose VIS Date Route   Moderna COVID-19 Vaccine 05/11/2020 10:25 AM 0.5 mL 11/2019 Intramuscular   Manufacturer: Moderna   Lot: [id number]   [place]: 20254-270-62

## 2020-05-11 NOTE — Progress Notes (Addendum)
Patient received [place] documented in error.

## 2020-06-02 DIAGNOSIS — R109 Unspecified abdominal pain: Secondary | ICD-10-CM | POA: Diagnosis not present

## 2020-06-02 DIAGNOSIS — I10 Essential (primary) hypertension: Secondary | ICD-10-CM | POA: Diagnosis not present

## 2020-06-02 DIAGNOSIS — E6609 Other obesity due to excess calories: Secondary | ICD-10-CM | POA: Diagnosis not present

## 2020-06-02 DIAGNOSIS — E119 Type 2 diabetes mellitus without complications: Secondary | ICD-10-CM | POA: Diagnosis not present

## 2020-06-02 DIAGNOSIS — Z6830 Body mass index (BMI) 30.0-30.9, adult: Secondary | ICD-10-CM | POA: Diagnosis not present

## 2020-06-02 DIAGNOSIS — E7849 Other hyperlipidemia: Secondary | ICD-10-CM | POA: Diagnosis not present

## 2020-06-02 DIAGNOSIS — N950 Postmenopausal bleeding: Secondary | ICD-10-CM | POA: Diagnosis not present

## 2020-08-31 DIAGNOSIS — I2510 Atherosclerotic heart disease of native coronary artery without angina pectoris: Secondary | ICD-10-CM | POA: Diagnosis not present

## 2020-08-31 DIAGNOSIS — E11319 Type 2 diabetes mellitus with unspecified diabetic retinopathy without macular edema: Secondary | ICD-10-CM | POA: Diagnosis not present

## 2020-08-31 DIAGNOSIS — I10 Essential (primary) hypertension: Secondary | ICD-10-CM | POA: Diagnosis not present

## 2020-08-31 DIAGNOSIS — E113293 Type 2 diabetes mellitus with mild nonproliferative diabetic retinopathy without macular edema, bilateral: Secondary | ICD-10-CM | POA: Diagnosis not present

## 2020-09-30 DIAGNOSIS — E11319 Type 2 diabetes mellitus with unspecified diabetic retinopathy without macular edema: Secondary | ICD-10-CM | POA: Diagnosis not present

## 2020-09-30 DIAGNOSIS — I10 Essential (primary) hypertension: Secondary | ICD-10-CM | POA: Diagnosis not present

## 2020-09-30 DIAGNOSIS — I2510 Atherosclerotic heart disease of native coronary artery without angina pectoris: Secondary | ICD-10-CM | POA: Diagnosis not present

## 2020-09-30 DIAGNOSIS — E113293 Type 2 diabetes mellitus with mild nonproliferative diabetic retinopathy without macular edema, bilateral: Secondary | ICD-10-CM | POA: Diagnosis not present

## 2020-10-23 ENCOUNTER — Other Ambulatory Visit: Payer: Self-pay

## 2020-10-23 ENCOUNTER — Ambulatory Visit: Payer: Medicare HMO | Admitting: Internal Medicine

## 2020-10-23 ENCOUNTER — Encounter: Payer: Self-pay | Admitting: Internal Medicine

## 2020-10-23 ENCOUNTER — Encounter: Payer: Self-pay | Admitting: *Deleted

## 2020-10-23 VITALS — BP 138/80 | HR 62 | Ht 60.5 in | Wt 171.6 lb

## 2020-10-23 DIAGNOSIS — I10 Essential (primary) hypertension: Secondary | ICD-10-CM

## 2020-10-23 DIAGNOSIS — E782 Mixed hyperlipidemia: Secondary | ICD-10-CM | POA: Diagnosis not present

## 2020-10-23 DIAGNOSIS — R079 Chest pain, unspecified: Secondary | ICD-10-CM

## 2020-10-23 NOTE — Progress Notes (Signed)
SUBJECTIVE: The patient presents for routine follow-up.  Past medical history is significant for coronary artery disease with a prior history of myocardial infarction, hyperlipidemia with previously elevated transaminases, hypertension and diabetes.  She underwent percutaneous coronary intervention for acute coronary syndrome on 09/11/2007 with a bare-metal stent placed in an occluded left circumflex coronary artery. That catheterization report mentions scattered sequential 80% lesions in the LAD. She was also noted to have a 60% lesion in the proximal RCA.  Echocardiogram  in August 2013 revealed normal left ventricular systolic function with an ejection fraction of 60-65%, moderate left ventricular hypertrophy and grade 1 diastolic dysfunction.  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  No Known Allergies  Current Outpatient Medications  Medication Sig Dispense Refill  . aspirin EC 81 MG tablet Take 81 mg by mouth every morning.    . Cholecalciferol (VITAMIN D) 125 MCG (5000 UT) CAPS Take by mouth daily.     . clopidogrel (PLAVIX) 75 MG tablet Take 1 tablet (75 mg total) by mouth every morning. 90 tablet 0  . diazepam (VALIUM) 10 MG tablet Take 10 mg by mouth once.    Marland Kitchen ibuprofen (ADVIL) 600 MG tablet Take 600 mg by mouth 3 (three) times daily.    Marland Kitchen lisinopril (PRINIVIL,ZESTRIL) 20 MG tablet Take 20 mg by mouth 2 (two) times daily.     . nitroGLYCERIN (NITROSTAT) 0.4 MG SL tablet Place 1 tablet (0.4 mg total) under the tongue every 5 (five) minutes as needed for chest pain. 25 tablet 3  . Polyethyl Glycol-Propyl Glycol (SYSTANE OP)  Apply 1 drop to eye 2 (two) times daily.     . rosuvastatin (CRESTOR) 10 MG tablet Take 10 mg by mouth daily.     . SitaGLIPtin-MetFORMIN HCl (JANUMET XR) [phone] MG TB24 Take by mouth daily.     Marland Kitchen amLODipine (NORVASC) 5 MG tablet Take 1 tablet (5 mg total) by mouth daily. 90 tablet 3   No current facility-administered medications for this visit.    Past Medical History:  Diagnosis Date  . Arthritis   . CAD (coronary artery disease)   . Cancer ([place])   . Diabetes mellitus ([place])    type 2 x  1 or 2 yrs  . Fatty liver   . GERD (gastroesophageal reflux disease)   . High cholesterol   . Hypertension   . Seasonal allergies     Past Surgical History:  Procedure Laterality Date  . CARDIAC CATHETERIZATION  09/11/2007   100% occlusion of the left circumflex. proceed with stenting  . CARDIAC CATHETERIZATION  09/11/2007   mid circumflex occlusion with tandem 80% proximal LAD lesion to the 50% mid LAD lesion. nonDES stenting of the circumflex occlusion after recanalization and balloon dilatation, the stent was a 2.75x78mm Liberte, TIMI3 flow was restored  . CARDIOVASCULAR STRESS TEST  03/24/2012   normal pattern of perfusion in all regions, no scintigraphic evidence of inducible myocardial ischemia  . CHOLECYSTECTOMY  2008  . COLONOSCOPY  06/17/2012   Procedure: COLONOSCOPY;  Surgeon:  Rogene Houston, MD;  [facility];  Service: Endoscopy;  Laterality: N/A;  100  . COLONOSCOPY WITH PROPOFOL N/A 01/14/2020   Procedure: COLONOSCOPY WITH PROPOFOL;  Surgeon: Rogene Houston, MD;  [facility];  Service: Endoscopy;  Laterality: N/A;  135  . DOPPLER ECHOCARDIOGRAPHY  07/20/2012   EF 60-65%, wall motion normal, there were no regional wall motion abnormalities  . Left breast lumpectomy    . LOWER EXTREMITY VENOUS DOPPLER Left 12/27/2011   no evidence of deep vein thrombosis involving the left lower extremity and right common femoral vein, enlarged inguinal lymph node is  visualized on the left. no baker's cyst on the left.  Marland Kitchen PARTIAL HYSTERECTOMY      Social History   Socioeconomic History  . Marital status: Single    Spouse name: Not on file  . Number of children: 1  . Years of education: Not on file  . Highest education level: Not on file  Occupational History  . Not on file  Tobacco Use  . Smoking status: Never Smoker  . Smokeless tobacco: Never Used  Vaping Use  . Vaping Use: Never used  Substance and Sexual Activity  . Alcohol use: No  . Drug use: No  . Sexual activity: Not Currently    Birth control/protection: Surgical  Other Topics Concern  . Not on file  Social History Narrative  . Not on file   Social Determinants of Health   Financial Resource Strain:   . Difficulty of Paying Living Expenses: Not on file  Food Insecurity:   . Worried About Charity fundraiser in the Last Year: Not on file  . Ran Out of Food in the Last Year: Not on file  Transportation Needs:   . Lack of Transportation (Medical): Not on file  . Lack of Transportation (Non-Medical): Not on file  Physical Activity:   . Days of Exercise per Week: Not on file  . Minutes of Exercise per Session: Not on file  Stress:   . Feeling of Stress : Not on file  Social Connections:   . Frequency of Communication with Friends and Family: Not on file  . Frequency of Social Gatherings with Friends and Family: Not on file  . Attends Religious Services: Not on file  . Active Member of Clubs or Organizations: Not on file  . Attends Archivist Meetings: Not on file  . Marital Status: Not on file  Intimate Partner Violence:   . Fear of Current or Ex-Partner: Not on file  . Emotionally Abused: Not on file  . Physically Abused: Not on file  . Sexually Abused: Not on file     Vitals:   10/23/20 0931  BP: 138/80  Pulse: 62  SpO2: 96%  Weight: 171 lb 9.6 oz (77.8 kg)  Height: 5' 0.5" (1.537 m)    Wt Readings from Last 3 Encounters:  10/23/20 171 lb 9.6 oz  (77.8 kg)  05/08/20 173 lb (78.5 kg)  05/03/20 173 lb (78.5 kg)     PHYSICAL EXAM General: NAD HEENT: Normal. Neck: No JVD,  No bruits   Lungs: Clear to auscultation bilaterally with normal respiratory effort. CV: Regular rate and rhythm, normal S1/S2 no murmur. No LE edemia Abdomen: Soft, nontender, no distention.  Neurologic: Alert and oriented.  Psych: Normal affect. Skin: Normal. Musculoskeletal: No gross deformities.   EKG is not done today    CATH  / PTCA Sep 11, 2007  LAD  Scattered  80% lesions  LCx100% occluded  RCA   Diffuse 60% proximal   Pt underwent PTCA/stent (Liberte BMS) to LCx      Labs: Lab Results  Component Value Date/Time   K 4.1 05/08/2020 05:01 PM   BUN 14 05/08/2020 05:01 PM   CREATININE 0.81 05/08/2020 05:01 PM   CREATININE 0.79 10/22/2016 08:11 AM   ALT 41 (H) 12/10/2016 07:49 AM   TSH 2.04 10/22/2016 08:11 AM   HGB 13.0 05/08/2020 05:01 PM     Lipids: Lab Results  Component Value Date/Time   LDLCALC 147 (H) 10/22/2016 08:11 AM   CHOL 219 (H) 10/22/2016 08:11 AM   TRIG 133 10/22/2016 08:11 AM   HDL 45 (L) 10/22/2016 08:11 AM       ASSESSMENT AND PLAN: 1.  Coronary artery disease: No symptoms of angina   She has been maintained on DAPT given signif residual dz   Has tolerated   Willl check with labs from Dr Gerarda Fraction  2.  Hypertension: BP is fair  COntinue to follow   Keep on same meds    3.  Hyperlipidemia: Continue rosuvastatin 10 mg.  Will get labs from Dr Fusco's office     Disposition: Follow up 1 year  Dorris Carnes MD

## 2020-10-23 NOTE — Patient Instructions (Signed)
Medication Instructions:  Your physician recommends that you continue on your current medications as directed. Please refer to the Current Medication list given to you today.  *If you need a refill on your cardiac medications before your next appointment, please call your pharmacy*   Lab Work: NONE  If you have labs (blood work) drawn today and your tests are completely normal, you will receive your results only by: MyChart Message (if you have MyChart) OR A paper copy in the mail If you have any lab test that is abnormal or we need to change your treatment, we will call you to review the results.   Testing/Procedures: NONE    Follow-Up: At [facility], you and your health needs are our priority.  As part of our continuing mission to provide you with exceptional heart care, we have created designated Provider Care Teams.  These Care Teams include your primary Cardiologist (physician) and Advanced Practice Providers (APPs -  Physician Assistants and Nurse Practitioners) who all work together to provide you with the care you need, when you need it.  We recommend signing up for the patient portal called "MyChart".  Sign up information is provided on this After Visit Summary.  MyChart is used to connect with patients for Virtual Visits (Telemedicine).  Patients are able to view lab/test results, encounter notes, upcoming appointments, etc.  Non-urgent messages can be sent to your provider as well.   To learn more about what you can do with MyChart, go to [email or web address].    Your next appointment:   1 year(s)  The format for your next appointment:   In Person  Provider:   Paula Ross, MD   Other Instructions Thank you for choosing [facility]!    

## 2020-10-31 DIAGNOSIS — I2510 Atherosclerotic heart disease of native coronary artery without angina pectoris: Secondary | ICD-10-CM | POA: Diagnosis not present

## 2020-10-31 DIAGNOSIS — I10 Essential (primary) hypertension: Secondary | ICD-10-CM | POA: Diagnosis not present

## 2020-10-31 DIAGNOSIS — E113293 Type 2 diabetes mellitus with mild nonproliferative diabetic retinopathy without macular edema, bilateral: Secondary | ICD-10-CM | POA: Diagnosis not present

## 2020-10-31 DIAGNOSIS — E11319 Type 2 diabetes mellitus with unspecified diabetic retinopathy without macular edema: Secondary | ICD-10-CM | POA: Diagnosis not present

## 2020-11-17 DIAGNOSIS — I2510 Atherosclerotic heart disease of native coronary artery without angina pectoris: Secondary | ICD-10-CM | POA: Diagnosis not present

## 2020-11-17 DIAGNOSIS — I10 Essential (primary) hypertension: Secondary | ICD-10-CM | POA: Diagnosis not present

## 2020-11-17 DIAGNOSIS — Z6830 Body mass index (BMI) 30.0-30.9, adult: Secondary | ICD-10-CM | POA: Diagnosis not present

## 2020-11-17 DIAGNOSIS — E1165 Type 2 diabetes mellitus with hyperglycemia: Secondary | ICD-10-CM | POA: Diagnosis not present

## 2020-11-17 DIAGNOSIS — E11319 Type 2 diabetes mellitus with unspecified diabetic retinopathy without macular edema: Secondary | ICD-10-CM | POA: Diagnosis not present

## 2020-11-17 DIAGNOSIS — E7849 Other hyperlipidemia: Secondary | ICD-10-CM | POA: Diagnosis not present

## 2020-11-28 DIAGNOSIS — E7849 Other hyperlipidemia: Secondary | ICD-10-CM | POA: Diagnosis not present

## 2020-11-28 DIAGNOSIS — E6609 Other obesity due to excess calories: Secondary | ICD-10-CM | POA: Diagnosis not present

## 2020-11-28 DIAGNOSIS — Z6830 Body mass index (BMI) 30.0-30.9, adult: Secondary | ICD-10-CM | POA: Diagnosis not present

## 2020-12-01 DIAGNOSIS — E11319 Type 2 diabetes mellitus with unspecified diabetic retinopathy without macular edema: Secondary | ICD-10-CM | POA: Diagnosis not present

## 2020-12-01 DIAGNOSIS — I2510 Atherosclerotic heart disease of native coronary artery without angina pectoris: Secondary | ICD-10-CM | POA: Diagnosis not present

## 2020-12-01 DIAGNOSIS — E113293 Type 2 diabetes mellitus with mild nonproliferative diabetic retinopathy without macular edema, bilateral: Secondary | ICD-10-CM | POA: Diagnosis not present

## 2020-12-01 DIAGNOSIS — I10 Essential (primary) hypertension: Secondary | ICD-10-CM | POA: Diagnosis not present

## 2020-12-04 ENCOUNTER — Other Ambulatory Visit (HOSPITAL_COMMUNITY): Payer: Self-pay | Admitting: Internal Medicine

## 2020-12-04 DIAGNOSIS — Z1231 Encounter for screening mammogram for malignant neoplasm of breast: Secondary | ICD-10-CM

## 2020-12-30 DIAGNOSIS — I2510 Atherosclerotic heart disease of native coronary artery without angina pectoris: Secondary | ICD-10-CM | POA: Diagnosis not present

## 2020-12-30 DIAGNOSIS — E11319 Type 2 diabetes mellitus with unspecified diabetic retinopathy without macular edema: Secondary | ICD-10-CM | POA: Diagnosis not present

## 2020-12-30 DIAGNOSIS — I10 Essential (primary) hypertension: Secondary | ICD-10-CM | POA: Diagnosis not present

## 2020-12-30 DIAGNOSIS — E113293 Type 2 diabetes mellitus with mild nonproliferative diabetic retinopathy without macular edema, bilateral: Secondary | ICD-10-CM | POA: Diagnosis not present

## 2021-01-15 ENCOUNTER — Encounter: Payer: Self-pay | Admitting: Emergency Medicine

## 2021-01-15 ENCOUNTER — Ambulatory Visit (HOSPITAL_COMMUNITY)
Admission: RE | Admit: 2021-01-15 | Discharge: 2021-01-15 | Disposition: A | Discharge status: Home / Self Care | Payer: Medicare HMO | Source: Ambulatory Visit | Attending: Internal Medicine | Admitting: Internal Medicine

## 2021-01-15 ENCOUNTER — Ambulatory Visit
Admission: EM | Admit: 2021-01-15 | Discharge: 2021-01-15 | Disposition: A | Discharge status: Home / Self Care | Payer: Medicare HMO | Attending: Internal Medicine | Admitting: Internal Medicine

## 2021-01-15 ENCOUNTER — Ambulatory Visit
Admission: EM | Admit: 2021-01-15 | Discharge: 2021-01-15 | Disposition: A | Discharge status: Home / Self Care | Payer: Medicare HMO | Attending: Family Medicine | Admitting: Family Medicine

## 2021-01-15 DIAGNOSIS — Z1231 Encounter for screening mammogram for malignant neoplasm of breast: Secondary | ICD-10-CM | POA: Diagnosis not present

## 2021-01-15 DIAGNOSIS — R229 Localized swelling, mass and lump, unspecified: Secondary | ICD-10-CM | POA: Diagnosis not present

## 2021-01-15 DIAGNOSIS — L259 Unspecified contact dermatitis, unspecified cause: Secondary | ICD-10-CM

## 2021-01-15 MED ORDER — DEXAMETHASONE SODIUM PHOSPHATE 10 MG/ML IJ SOLN
10.0000 mg | Freq: Once | INTRAMUSCULAR | Status: AC
  Administered 2021-01-15: 10 mg via INTRAMUSCULAR

## 2021-01-15 MED ORDER — PREDNISONE 20 MG PO TABS
20.0000 mg | ORAL_TABLET | Freq: Every day | ORAL | 0 refills | Status: AC
Start: 2021-01-15 — End: 2021-01-20

## 2021-01-15 MED ORDER — TRIAMCINOLONE ACETONIDE 0.025 % EX OINT
1.0000 | TOPICAL_OINTMENT | Freq: Two times a day (BID) | CUTANEOUS | 0 refills | Status: DC
Start: 2021-01-15 — End: 2023-02-24

## 2021-01-15 NOTE — Discharge Instructions (Signed)
I am treating you for a skin reaction related to an unknown trigger either an insect bite or some other irritant.  He received a steroid shot here in clinic today.  You will start 20 mg of steroids by mouth tomorrow and take daily over the next 5 days.  I have also prescribed you a steroid cream to place on the affected area twice daily to reduce the itching and irritation.  Contact your primary care doctor if symptoms have not completely resolved by Saturday I want you to follow-up with your primary care the first of next week for additional work-up and evaluation.

## 2021-01-15 NOTE — ED Provider Notes (Signed)
[facility]    CSN: [id number] Arrival date & time: 01/15/21  1658      History   Chief Complaint No chief complaint on file.   HPI [name] is a 75 y.o. female.   HPI  Patient reports while watching TV yesterday she developed a lump on the side of her neck.  The lump is localized to the right side of her neck and is itchy.  She reports the itching has progressed under her chin which she never has a rash under the chin.  She denies the sensation of being bitten by any insect or any changes to diet or medications yesterday. Rashes has progressively worsened as far as itching however has not had any bigger.  It is not painful to touch.  She has had no similar type rash previous.  Past Medical History:  Diagnosis Date  . Arthritis   . CAD (coronary artery disease)   . Cancer ([place])   . Diabetes mellitus ([place])    type 2 x  1 or 2 yrs  . Fatty liver   . GERD (gastroesophageal reflux disease)   . High cholesterol   . Hypertension   . Seasonal allergies     Patient Active Problem List   Diagnosis Date Noted  . Renal mass 03/27/2020  . Abnormal abdominal CT scan 01/10/2020  . Elevated liver enzymes 05/13/2013  . IBS (irritable bowel syndrome) 10/22/2012  . Chronic RLQ pain 05/27/2012  . Hypertension 05/27/2012  . High cholesterol 05/27/2012  . CAD (coronary artery disease) 05/27/2012    Past Surgical History:  Procedure Laterality Date  . CARDIAC CATHETERIZATION  09/11/2007   100% occlusion of the left circumflex. proceed with stenting  . CARDIAC CATHETERIZATION  09/11/2007   mid circumflex occlusion with tandem 80% proximal LAD lesion to the 50% mid LAD lesion. nonDES stenting of the circumflex occlusion after recanalization and balloon dilatation, the stent was a 2.75x57mm Liberte, TIMI3 flow was restored  . CARDIOVASCULAR STRESS TEST  03/24/2012   normal pattern of perfusion in all regions, no scintigraphic evidence of inducible myocardial ischemia  .  CHOLECYSTECTOMY  2008  . COLONOSCOPY  06/17/2012   Procedure: COLONOSCOPY;  Surgeon: Rogene Houston, MD;  [facility];  Service: Endoscopy;  Laterality: N/A;  100  . COLONOSCOPY WITH PROPOFOL N/A 01/14/2020   Procedure: COLONOSCOPY WITH PROPOFOL;  Surgeon: Rogene Houston, MD;  [facility];  Service: Endoscopy;  Laterality: N/A;  135  . DOPPLER ECHOCARDIOGRAPHY  07/20/2012   EF 60-65%, wall motion normal, there were no regional wall motion abnormalities  . Left breast lumpectomy    . LOWER EXTREMITY VENOUS DOPPLER Left 12/27/2011   no evidence of deep vein thrombosis involving the left lower extremity and right common femoral vein, enlarged inguinal lymph node is visualized on the left. no baker's cyst on the left.  Marland Kitchen PARTIAL HYSTERECTOMY      OB History   No obstetric history on file.      Home Medications    Prior to Admission medications   Medication Sig Start Date End Date Taking? Authorizing Provider  predniSONE (DELTASONE) 20 MG tablet Take 1 tablet (20 mg total) by mouth daily for 5 days. 01/15/21 01/20/21 Yes Scot Jun, FNP  triamcinolone (KENALOG) 0.025 % ointment Apply 1 application topically 2 (two) times daily. 01/15/21  Yes Scot Jun, FNP  amLODipine (NORVASC) 5 MG tablet Take 1 tablet (5 mg total) by mouth daily.  10/27/19 05/03/20  Herminio Commons, MD  aspirin EC 81 MG tablet Take 81 mg by mouth every morning.    [provider]  Cholecalciferol (VITAMIN D) 125 MCG (5000 UT) CAPS Take by mouth daily.     [provider]  clopidogrel (PLAVIX) 75 MG tablet Take 1 tablet (75 mg total) by mouth every morning. 11/18/13   Pixie Casino, MD  diazepam (VALIUM) 10 MG tablet Take 10 mg by mouth once. 04/10/20   [provider]  ibuprofen (ADVIL) 600 MG tablet Take 600 mg by mouth 3 (three) times daily. 02/18/20   [provider]  lisinopril (PRINIVIL,ZESTRIL) 20 MG tablet Take 20 mg by mouth 2 (two) times  daily.  06/19/17   [provider]  nitroGLYCERIN (NITROSTAT) 0.4 MG SL tablet Place 1 tablet (0.4 mg total) under the tongue every 5 (five) minutes as needed for chest pain. 07/15/17 10/23/20  Herminio Commons, MD  Polyethyl Glycol-Propyl Glycol (SYSTANE OP) Apply 1 drop to eye 2 (two) times daily.     [provider]  rosuvastatin (CRESTOR) 10 MG tablet Take 10 mg by mouth daily.  05/22/17   [provider]  SitaGLIPtin-MetFORMIN HCl (JANUMET XR) [phone] MG TB24 Take by mouth daily.     [provider]    Family History Family History  Problem Relation Age of Onset  . Heart disease Mother   . Cancer Mother        Breast cancer    Social History Social History   Tobacco Use  . Smoking status: Never Smoker  . Smokeless tobacco: Never Used  Vaping Use  . Vaping Use: Never used  Substance Use Topics  . Alcohol use: No  . Drug use: No     Allergies   Patient has no known allergies.   Review of Systems Review of Systems Pertinent negatives listed in HPI  Physical Exam Triage Vital Signs ED Triage Vitals [01/15/21 1712]  Enc Vitals Group     BP (!) 169/88     Pulse Rate 72     Resp 18     Temp 98 F (36.7 C)     Temp Source Oral     SpO2 96 %     Weight      Height      Head Circumference      Peak Flow      Pain Score      Pain Loc      Pain Edu?      Excl. in [place]?    No data found.  Updated Vital Signs BP (!) 169/88 (BP Location: Right Arm)   Pulse 72   Temp 98 F (36.7 C) (Oral)   Resp 18   SpO2 96%   Visual Acuity Right Eye Distance:   Left Eye Distance:   Bilateral Distance:    Right Eye Near:   Left Eye Near:    Bilateral Near:     Physical Exam HENT:     Head: Normocephalic.  Neck:      Comments: Urticarial rash with lump like feature present on right neck (lump is pruritic)  Cardiovascular:     Rate and Rhythm: Normal rate.  Pulmonary:     Effort: Pulmonary effort is normal.     Breath  sounds: Normal breath sounds.  Skin:    General: Skin is warm.     Capillary Refill: Capillary refill takes less than 2 seconds.  Neurological:  General: No focal deficit present.     Mental Status: She is alert.  Psychiatric:        Mood and Affect: Mood normal.        Behavior: Behavior normal.        Thought Content: Thought content normal.        Judgment: Judgment normal.      UC Treatments / Results  Labs (all labs ordered are listed, but only abnormal results are displayed) Labs Reviewed - No data to display  EKG   Radiology No results found.  Procedures Procedures (including critical care time)  Medications Ordered in UC Medications  dexamethasone (DECADRON) injection 10 mg (10 mg Intramuscular Given 01/15/21 1725)    Initial Impression / Assessment and Plan / UC Course  I have reviewed the triage vital signs and the nursing notes.  Pertinent labs & imaging results that were available during my care of the patient were reviewed by me and considered in my medical decision making (see chart for details).    Patient presents today with a lump of the neck which spread up pruritic rash.  Lump right neck urticarial features that extend to below the chin and mid neck consistent with that of a dermatitis irritation. Decadron 10 mg IM given in clinic. D/C home with oral prednisone to start tomorrow, low dose due to diabetic. Topical steroid due to excessive itching. Patient advised to follow-up with PCP if prescribed treatment doesn't completely resolve irritation.  Also advised to launder any linen and cleanse chairs as rash could be the result of ant or  spider bite. Final Clinical Impressions(s) / UC Diagnoses   Final diagnoses:  Lumps on the skin  Contact dermatitis, unspecified contact dermatitis type, unspecified trigger     Discharge Instructions     I am treating you for a skin reaction related to an unknown trigger either an insect bite or some other  irritant.  He received a steroid shot here in clinic today.  You will start 20 mg of steroids by mouth tomorrow and take daily over the next 5 days.  I have also prescribed you a steroid cream to place on the affected area twice daily to reduce the itching and irritation.  Contact your primary care doctor if symptoms have not completely resolved by Saturday I want you to follow-up with your primary care the first of next week for additional work-up and evaluation.   ED Prescriptions    Medication Sig Dispense Auth. Provider   triamcinolone (KENALOG) 0.025 % ointment Apply 1 application topically 2 (two) times daily. 80 g Scot Jun, FNP   predniSONE (DELTASONE) 20 MG tablet Take 1 tablet (20 mg total) by mouth daily for 5 days. 5 tablet Scot Jun, FNP     PDMP not reviewed this encounter.   Scot Jun, [place] 01/15/21 1815

## 2021-01-15 NOTE — ED Triage Notes (Signed)
Pain to RT neck that started yesterday evening

## 2021-01-23 IMAGING — MG DIGITAL SCREENING BILAT W/ TOMO W/ CAD
8 series · 8 of 24 positions shown · non-contrast
Comparison: Previous exam(s).

CLINICAL DATA: Screening.

EXAM:
DIGITAL SCREENING BILATERAL MAMMOGRAM WITH TOMO AND CAD

[L MLO synth-2D]
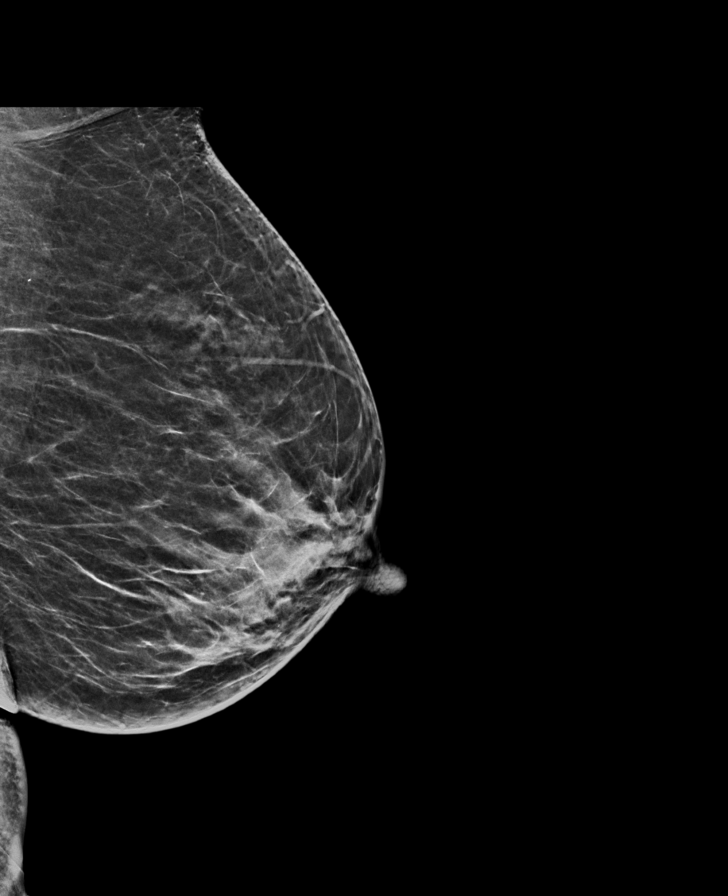

[R CC synth-2D]
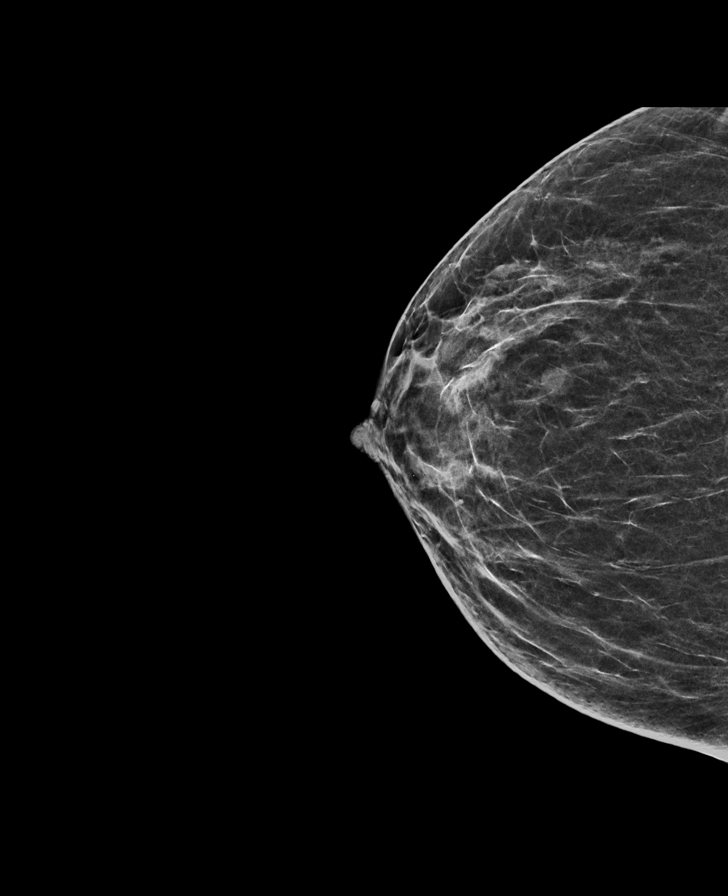

[R MLO synth-2D]
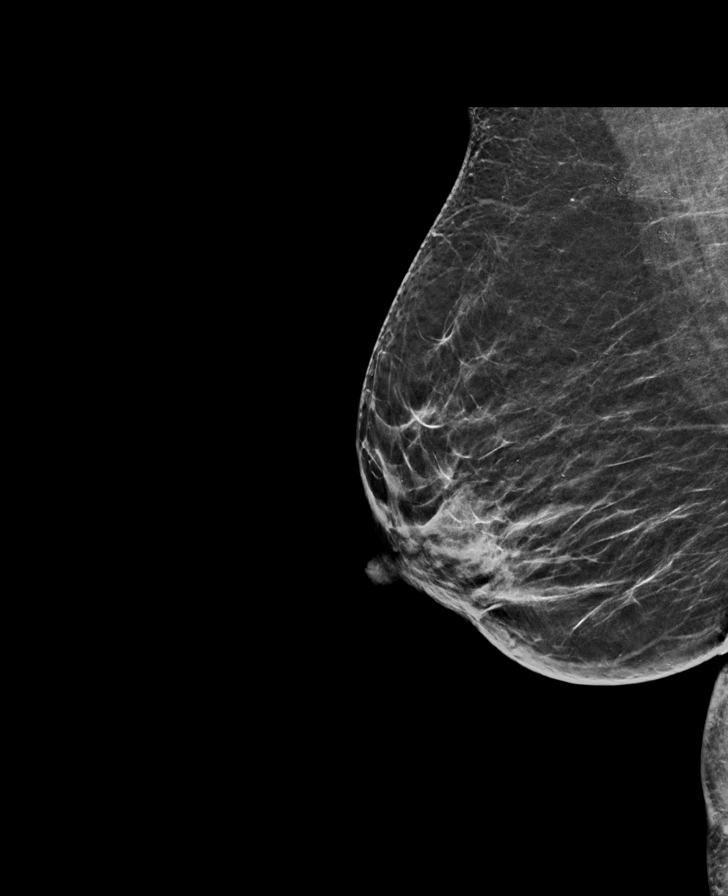

[L CC synth-2D]
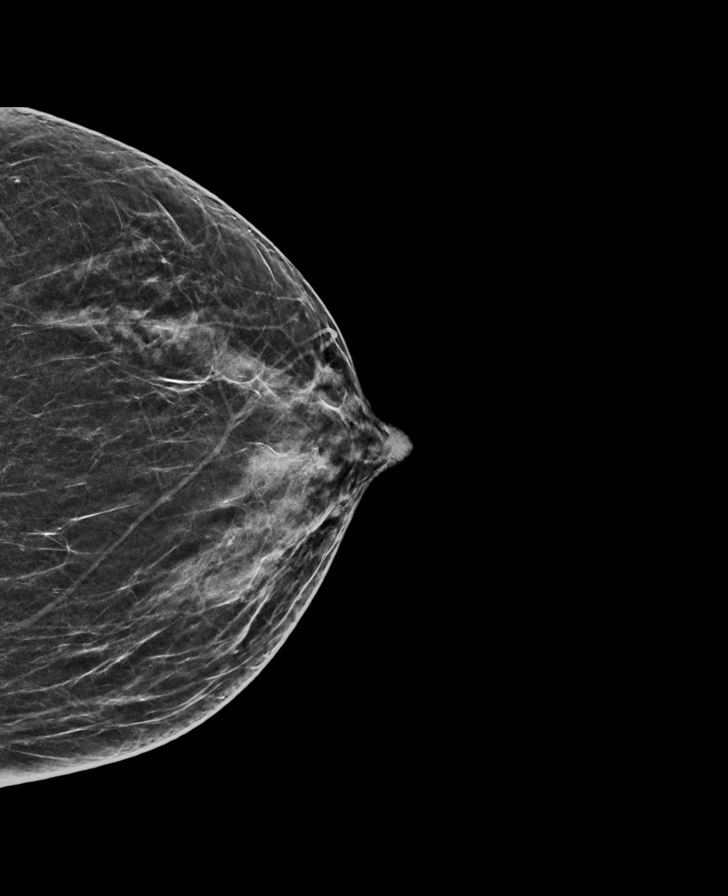

[L CC tomo · tomo slice 25/50.0]
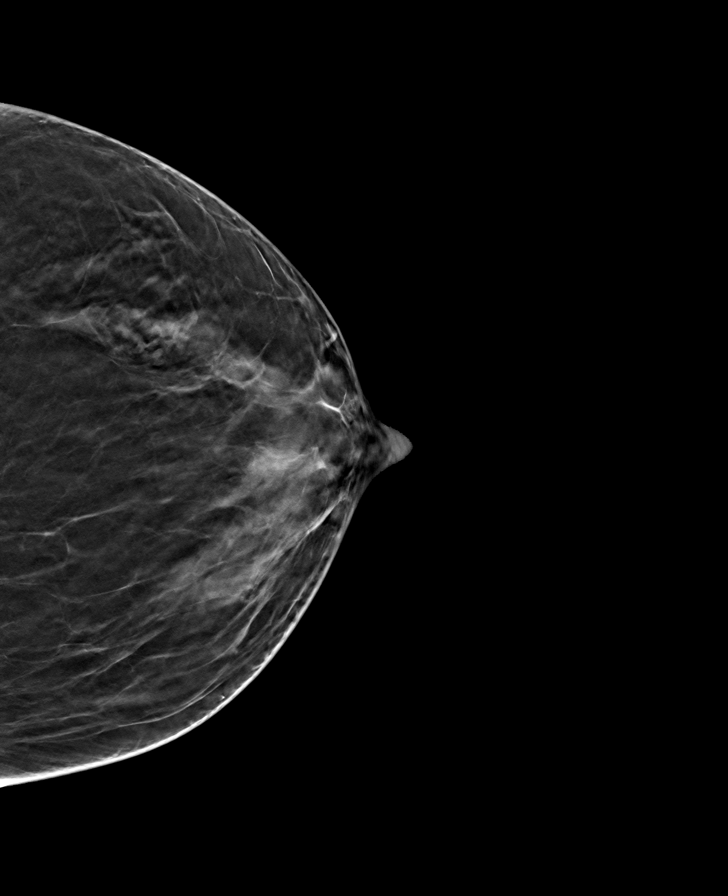

[R MLO tomo · tomo slice 29/56.0]
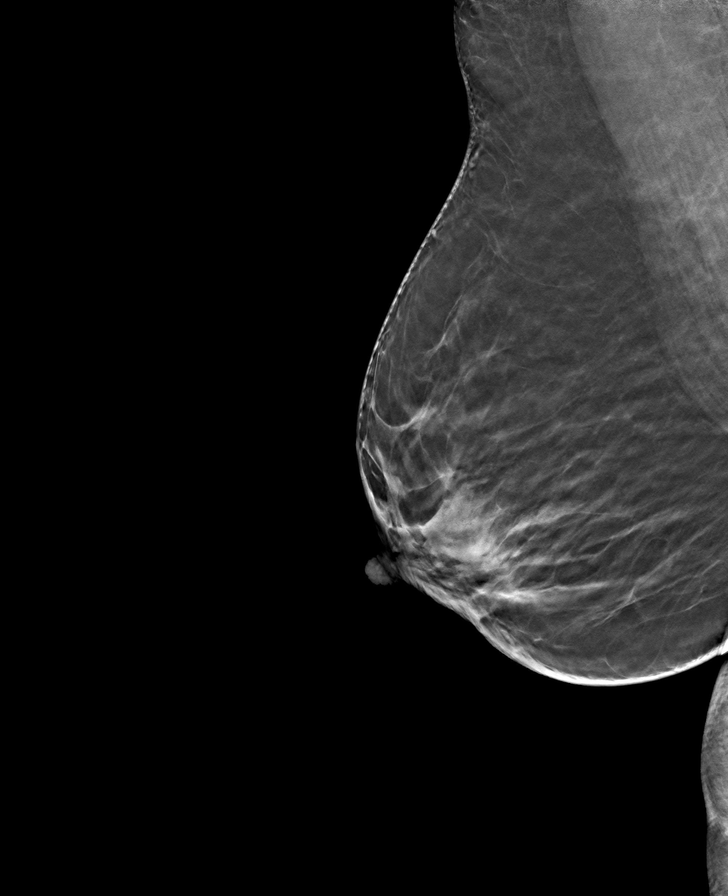

[L MLO tomo · tomo slice 27/54.0]
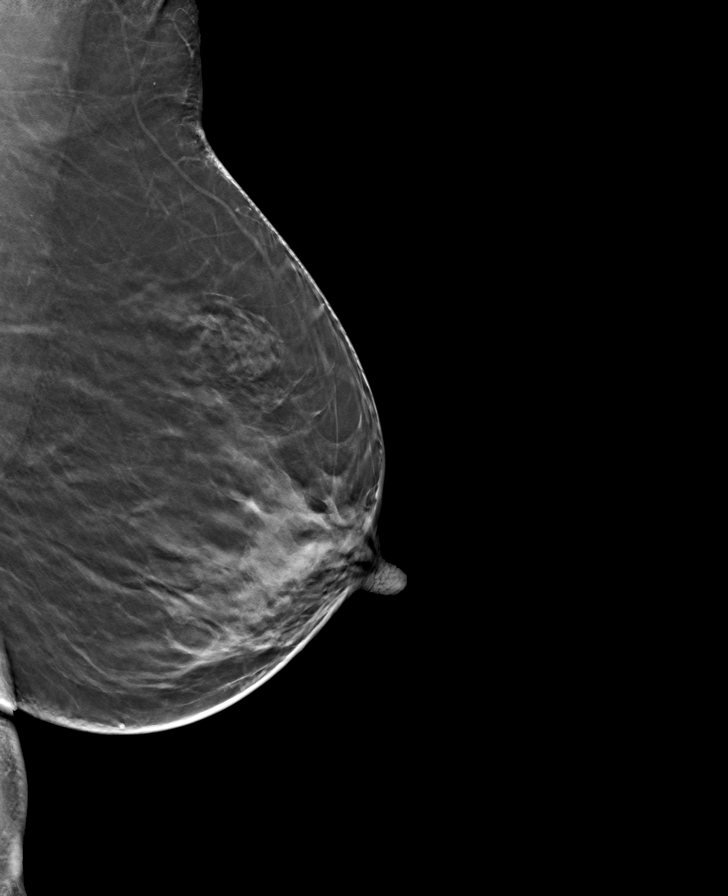

[R CC tomo · tomo slice 25/50.0]
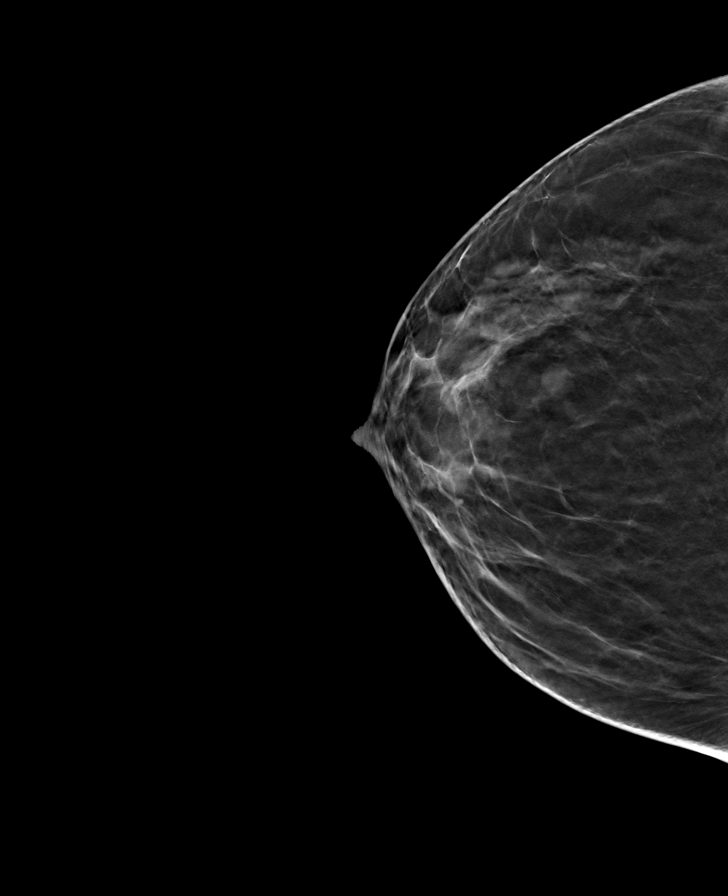

[8 of 24 positions shown; findings below may reference images not displayed]

ACR Breast Density Category b: There are scattered areas of
fibroglandular density.
FINDINGS: There are no findings suspicious for malignancy. Images were
processed with CAD.
IMPRESSION: No mammographic evidence of malignancy. A result letter of this
screening mammogram will be mailed directly to the patient.

RECOMMENDATION:
Screening mammogram in one year. (Code:CN-U-775)

BI-RADS CATEGORY  1: Negative.

## 2021-02-06 DIAGNOSIS — E119 Type 2 diabetes mellitus without complications: Secondary | ICD-10-CM | POA: Diagnosis not present

## 2021-02-16 ENCOUNTER — Other Ambulatory Visit: Payer: Self-pay

## 2021-02-16 ENCOUNTER — Ambulatory Visit
Admission: EM | Admit: 2021-02-16 | Discharge: 2021-02-16 | Disposition: A | Discharge status: Home / Self Care | Payer: Medicare HMO | Attending: Emergency Medicine | Admitting: Emergency Medicine

## 2021-02-16 ENCOUNTER — Encounter: Payer: Self-pay | Admitting: Emergency Medicine

## 2021-02-16 DIAGNOSIS — L259 Unspecified contact dermatitis, unspecified cause: Secondary | ICD-10-CM | POA: Diagnosis not present

## 2021-02-16 DIAGNOSIS — L299 Pruritus, unspecified: Secondary | ICD-10-CM

## 2021-02-16 MED ORDER — PREDNISONE 20 MG PO TABS: 20.0000 mg | ORAL_TABLET | Freq: Two times a day (BID) | ORAL | 0 refills | Status: AC

## 2021-02-16 NOTE — Discharge Instructions (Signed)
Use triamcinolone cream for a few days Prednisone prescribed.  Take as directed and to completion Follow up with PCP if symptoms persists Return or go to the ER if you have any new or worsening symptoms such as fever, chills, nausea, vomiting, redness, swelling, discharge, if symptoms do not improve with medications, etc..Marland Kitchen

## 2021-02-16 NOTE — ED Provider Notes (Signed)
[facility]   [id number] 02/16/21 Arrival Time: 60  CC: Bump  SUBJECTIVE:  [name] is a 75 y.o. female who presents with a bump above LT eyebrow x 2 days. Denies changes in soaps, detergents, close contacts with similar rash, known trigger or environmental trigger, allergy.  Localizes the bump to above LT eyebrow.  Describes it as itchy.  Has tried rubbing with cotton swab without relief.  Denies aggravating factors.  Reports similar symptoms in the past that improved with prednisone.   Denies fever, chills, nausea, vomiting, erythema, SOB, chest pain, abdominal pain, changes in bowel or bladder function.    ROS: As per HPI.  All other pertinent ROS negative.     Past Medical History:  Diagnosis Date  . Arthritis   . CAD (coronary artery disease)   . Cancer ([place])   . Diabetes mellitus ([place])    type 2 x  1 or 2 yrs  . Fatty liver   . GERD (gastroesophageal reflux disease)   . High cholesterol   . Hypertension   . Seasonal allergies    Past Surgical History:  Procedure Laterality Date  . CARDIAC CATHETERIZATION  09/11/2007   100% occlusion of the left circumflex. proceed with stenting  . CARDIAC CATHETERIZATION  09/11/2007   mid circumflex occlusion with tandem 80% proximal LAD lesion to the 50% mid LAD lesion. nonDES stenting of the circumflex occlusion after recanalization and balloon dilatation, the stent was a 2.75x73mm Liberte, TIMI3 flow was restored  . CARDIOVASCULAR STRESS TEST  03/24/2012   normal pattern of perfusion in all regions, no scintigraphic evidence of inducible myocardial ischemia  . CHOLECYSTECTOMY  2008  . COLONOSCOPY  06/17/2012   Procedure: COLONOSCOPY;  Surgeon: Rogene Houston, MD;  [facility];  Service: Endoscopy;  Laterality: N/A;  100  . COLONOSCOPY WITH PROPOFOL N/A 01/14/2020   Procedure: COLONOSCOPY WITH PROPOFOL;  Surgeon: Rogene Houston, MD;  [facility];  Service: Endoscopy;  Laterality: N/A;  135  .  DOPPLER ECHOCARDIOGRAPHY  07/20/2012   EF 60-65%, wall motion normal, there were no regional wall motion abnormalities  . Left breast lumpectomy    . LOWER EXTREMITY VENOUS DOPPLER Left 12/27/2011   no evidence of deep vein thrombosis involving the left lower extremity and right common femoral vein, enlarged inguinal lymph node is visualized on the left. no baker's cyst on the left.  Marland Kitchen PARTIAL HYSTERECTOMY     No Known Allergies No current facility-administered medications on file prior to encounter.   Current Outpatient Medications on File Prior to Encounter  Medication Sig Dispense Refill  . amLODipine (NORVASC) 5 MG tablet Take 1 tablet (5 mg total) by mouth daily. 90 tablet 3  . aspirin EC 81 MG tablet Take 81 mg by mouth every morning.    . Cholecalciferol (VITAMIN D) 125 MCG (5000 UT) CAPS Take by mouth daily.     . clopidogrel (PLAVIX) 75 MG tablet Take 1 tablet (75 mg total) by mouth every morning. 90 tablet 0  . diazepam (VALIUM) 10 MG tablet Take 10 mg by mouth once.    Marland Kitchen ibuprofen (ADVIL) 600 MG tablet Take 600 mg by mouth 3 (three) times daily.    Marland Kitchen lisinopril (PRINIVIL,ZESTRIL) 20 MG tablet Take 20 mg by mouth 2 (two) times daily.     . nitroGLYCERIN (NITROSTAT) 0.4 MG SL tablet Place 1 tablet (0.4 mg total) under the tongue every 5 (five) minutes as needed for chest pain.  25 tablet 3  . Polyethyl Glycol-Propyl Glycol (SYSTANE OP) Apply 1 drop to eye 2 (two) times daily.     . rosuvastatin (CRESTOR) 10 MG tablet Take 10 mg by mouth daily.     . SitaGLIPtin-MetFORMIN HCl (JANUMET XR) [phone] MG TB24 Take by mouth daily.     Marland Kitchen triamcinolone (KENALOG) 0.025 % ointment Apply 1 application topically 2 (two) times daily. 80 g 0   Social History   Socioeconomic History  . Marital status: Single    Spouse name: Not on file  . Number of children: 1  . Years of education: Not on file  . Highest education level: Not on file  Occupational History  . Not on file  Tobacco Use  .  Smoking status: Never Smoker  . Smokeless tobacco: Never Used  Vaping Use  . Vaping Use: Never used  Substance and Sexual Activity  . Alcohol use: No  . Drug use: No  . Sexual activity: Not Currently    Birth control/protection: Surgical  Other Topics Concern  . Not on file  Social History Narrative  . Not on file   Social Determinants of Health   Financial Resource Strain: Not on file  Food Insecurity: Not on file  Transportation Needs: Not on file  Physical Activity: Not on file  Stress: Not on file  Social Connections: Not on file  Intimate Partner Violence: Not on file   Family History  Problem Relation Age of Onset  . Heart disease Mother   . Cancer Mother        Breast cancer    OBJECTIVE: Vitals:   02/16/21 1450  BP: (!) 176/83  Pulse: 66  Resp: 16  Temp: 97.6 F (36.4 C)  TempSrc: Oral  SpO2: 98%    General appearance: alert; no distress Head: NCAT Eyes: EOMI grossly Lungs: normal respiratory effort Skin: warm and dry; 0.5 cm bump above LT eyebrow, with overlying erythema, NTTP, no obvious drainage or bleeding Psychological: alert and cooperative; normal mood and affect  ASSESSMENT & PLAN:  1. Contact dermatitis, unspecified contact dermatitis type, unspecified trigger   2. Itching     Meds ordered this encounter  Medications  . predniSONE (DELTASONE) 20 MG tablet    Sig: Take 1 tablet (20 mg total) by mouth 2 (two) times daily with a meal for 5 days.    Dispense:  10 tablet    Refill:  0    Order Specific Question:   Supervising Provider    Answer:   Raylene Everts [[id number]]    Use triamcinolone cream for a few days Prednisone prescribed.  Take as directed and to completion Follow up with PCP if symptoms persists Return or go to the ER if you have any new or worsening symptoms such as fever, chills, nausea, vomiting, redness, swelling, discharge, if symptoms do not improve with medications, etc...  Reviewed expectations re: course of  current medical issues. Questions answered. Outlined signs and symptoms indicating need for more acute intervention. Patient verbalized understanding. After Visit Summary given.   Lestine Box, PA-C 02/16/21 1458

## 2021-02-16 NOTE — ED Triage Notes (Signed)
Bump above LT eye that is itchy

## 2021-02-28 DIAGNOSIS — E7849 Other hyperlipidemia: Secondary | ICD-10-CM | POA: Diagnosis not present

## 2021-02-28 DIAGNOSIS — I2510 Atherosclerotic heart disease of native coronary artery without angina pectoris: Secondary | ICD-10-CM | POA: Diagnosis not present

## 2021-02-28 DIAGNOSIS — E11319 Type 2 diabetes mellitus with unspecified diabetic retinopathy without macular edema: Secondary | ICD-10-CM | POA: Diagnosis not present

## 2021-02-28 DIAGNOSIS — I10 Essential (primary) hypertension: Secondary | ICD-10-CM | POA: Diagnosis not present

## 2021-02-28 DIAGNOSIS — E113293 Type 2 diabetes mellitus with mild nonproliferative diabetic retinopathy without macular edema, bilateral: Secondary | ICD-10-CM | POA: Diagnosis not present

## 2021-03-08 DIAGNOSIS — E6609 Other obesity due to excess calories: Secondary | ICD-10-CM | POA: Diagnosis not present

## 2021-03-08 DIAGNOSIS — E11319 Type 2 diabetes mellitus with unspecified diabetic retinopathy without macular edema: Secondary | ICD-10-CM | POA: Diagnosis not present

## 2021-03-08 DIAGNOSIS — Z0001 Encounter for general adult medical examination with abnormal findings: Secondary | ICD-10-CM | POA: Diagnosis not present

## 2021-03-08 DIAGNOSIS — Z6830 Body mass index (BMI) 30.0-30.9, adult: Secondary | ICD-10-CM | POA: Diagnosis not present

## 2021-03-08 DIAGNOSIS — I10 Essential (primary) hypertension: Secondary | ICD-10-CM | POA: Diagnosis not present

## 2021-03-08 DIAGNOSIS — I2510 Atherosclerotic heart disease of native coronary artery without angina pectoris: Secondary | ICD-10-CM | POA: Diagnosis not present

## 2021-03-08 DIAGNOSIS — E119 Type 2 diabetes mellitus without complications: Secondary | ICD-10-CM | POA: Diagnosis not present

## 2021-03-08 DIAGNOSIS — Z1389 Encounter for screening for other disorder: Secondary | ICD-10-CM | POA: Diagnosis not present

## 2021-03-08 DIAGNOSIS — Z1331 Encounter for screening for depression: Secondary | ICD-10-CM | POA: Diagnosis not present

## 2021-03-08 DIAGNOSIS — N950 Postmenopausal bleeding: Secondary | ICD-10-CM | POA: Diagnosis not present

## 2021-03-14 ENCOUNTER — Other Ambulatory Visit: Payer: Self-pay | Admitting: Family Medicine

## 2021-03-14 ENCOUNTER — Other Ambulatory Visit (HOSPITAL_COMMUNITY): Payer: Self-pay | Admitting: Family Medicine

## 2021-03-14 DIAGNOSIS — N950 Postmenopausal bleeding: Secondary | ICD-10-CM

## 2021-05-01 DIAGNOSIS — E11319 Type 2 diabetes mellitus with unspecified diabetic retinopathy without macular edema: Secondary | ICD-10-CM | POA: Diagnosis not present

## 2021-05-01 DIAGNOSIS — E7849 Other hyperlipidemia: Secondary | ICD-10-CM | POA: Diagnosis not present

## 2021-05-01 DIAGNOSIS — I2510 Atherosclerotic heart disease of native coronary artery without angina pectoris: Secondary | ICD-10-CM | POA: Diagnosis not present

## 2021-05-01 DIAGNOSIS — I10 Essential (primary) hypertension: Secondary | ICD-10-CM | POA: Diagnosis not present

## 2021-05-31 DIAGNOSIS — I10 Essential (primary) hypertension: Secondary | ICD-10-CM | POA: Diagnosis not present

## 2021-05-31 DIAGNOSIS — E7849 Other hyperlipidemia: Secondary | ICD-10-CM | POA: Diagnosis not present

## 2021-05-31 DIAGNOSIS — E11319 Type 2 diabetes mellitus with unspecified diabetic retinopathy without macular edema: Secondary | ICD-10-CM | POA: Diagnosis not present

## 2021-06-28 DIAGNOSIS — E11319 Type 2 diabetes mellitus with unspecified diabetic retinopathy without macular edema: Secondary | ICD-10-CM | POA: Diagnosis not present

## 2021-06-28 DIAGNOSIS — Z6830 Body mass index (BMI) 30.0-30.9, adult: Secondary | ICD-10-CM | POA: Diagnosis not present

## 2021-06-28 DIAGNOSIS — E6609 Other obesity due to excess calories: Secondary | ICD-10-CM | POA: Diagnosis not present

## 2021-06-28 DIAGNOSIS — E782 Mixed hyperlipidemia: Secondary | ICD-10-CM | POA: Diagnosis not present

## 2021-06-28 DIAGNOSIS — I10 Essential (primary) hypertension: Secondary | ICD-10-CM | POA: Diagnosis not present

## 2021-07-01 DIAGNOSIS — E11319 Type 2 diabetes mellitus with unspecified diabetic retinopathy without macular edema: Secondary | ICD-10-CM | POA: Diagnosis not present

## 2021-07-01 DIAGNOSIS — I10 Essential (primary) hypertension: Secondary | ICD-10-CM | POA: Diagnosis not present

## 2021-07-01 DIAGNOSIS — E7849 Other hyperlipidemia: Secondary | ICD-10-CM | POA: Diagnosis not present

## 2021-07-16 ENCOUNTER — Encounter: Payer: Self-pay | Admitting: Emergency Medicine

## 2021-07-16 ENCOUNTER — Ambulatory Visit
Admission: EM | Admit: 2021-07-16 | Discharge: 2021-07-16 | Disposition: A | Discharge status: Home / Self Care | Payer: Medicare HMO | Attending: Family Medicine | Admitting: Family Medicine

## 2021-07-16 DIAGNOSIS — L249 Irritant contact dermatitis, unspecified cause: Secondary | ICD-10-CM

## 2021-07-16 MED ORDER — DEXAMETHASONE SODIUM PHOSPHATE 10 MG/ML IJ SOLN
10.0000 mg | Freq: Once | INTRAMUSCULAR | Status: AC
  Administered 2021-07-16: 10 mg via INTRAMUSCULAR

## 2021-07-16 MED ORDER — PREDNISONE 10 MG PO TABS
10.0000 mg | ORAL_TABLET | Freq: Every day | ORAL | 0 refills | Status: AC
Start: 2021-07-16 — End: 2021-07-21

## 2021-07-16 NOTE — ED Provider Notes (Signed)
[facility]    CSN: [id number] Arrival date & time: 07/16/21  1528      History   Chief Complaint No chief complaint on file.   HPI [name] is a 75 y.o. female.   HPI Patient presents today for evaluation of skin eruption involving her neck and bilateral hands.  Patient has been seen here at urgent care twice before for similar type eruption.  The rash is pruritic nondraining.  She denies any known irritant.  Patient has diabetes however her diabetes is very well controlled.  She had some leftover triamcinolone cream and did apply that to her hands which improved itching however the neck rash only developed prior to coming to urgent care today.  Denies any recent illness or fever.  Past Medical History:  Diagnosis Date   Arthritis    CAD (coronary artery disease)    Cancer ([place])    Diabetes mellitus ([place])    type 2 x  1 or 2 yrs   Fatty liver    GERD (gastroesophageal reflux disease)    High cholesterol    Hypertension    Seasonal allergies     Patient Active Problem List   Diagnosis Date Noted   Renal mass 03/27/2020   Abnormal abdominal CT scan 01/10/2020   Elevated liver enzymes 05/13/2013   IBS (irritable bowel syndrome) 10/22/2012   Chronic RLQ pain 05/27/2012   Hypertension 05/27/2012   High cholesterol 05/27/2012   CAD (coronary artery disease) 05/27/2012    Past Surgical History:  Procedure Laterality Date   CARDIAC CATHETERIZATION  09/11/2007   100% occlusion of the left circumflex. proceed with stenting   CARDIAC CATHETERIZATION  09/11/2007   mid circumflex occlusion with tandem 80% proximal LAD lesion to the 50% mid LAD lesion. nonDES stenting of the circumflex occlusion after recanalization and balloon dilatation, the stent was a 2.75x48m Liberte, TIMI3 flow was restored   CARDIOVASCULAR STRESS TEST  03/24/2012   normal pattern of perfusion in all regions, no scintigraphic evidence of inducible myocardial ischemia   CHOLECYSTECTOMY   2008   COLONOSCOPY  06/17/2012   Procedure: COLONOSCOPY;  Surgeon: NRogene Houston MD;  [facility];  Service: Endoscopy;  Laterality: N/A;  100   COLONOSCOPY WITH PROPOFOL N/A 01/14/2020   Procedure: COLONOSCOPY WITH PROPOFOL;  Surgeon: RRogene Houston MD;  [facility];  Service: Endoscopy;  Laterality: N/A;  1[place] 07/20/2012   EF 60-65%, wall motion normal, there were no regional wall motion abnormalities   Left breast lumpectomy     LOWER EXTREMITY VENOUS DOPPLER Left 12/27/2011   no evidence of deep vein thrombosis involving the left lower extremity and right common femoral vein, enlarged inguinal lymph node is visualized on the left. no baker's cyst on the left.   PARTIAL HYSTERECTOMY      OB History   No obstetric history on file.      Home Medications    Prior to Admission medications   Medication Sig Start Date End Date Taking? Authorizing Provider  amLODipine (NORVASC) 5 MG tablet Take 1 tablet (5 mg total) by mouth daily. 10/27/19 05/03/20  KHerminio Commons MD  aspirin EC 81 MG tablet Take 81 mg by mouth every morning.    [provider]  Cholecalciferol (VITAMIN D) 125 MCG (5000 UT) CAPS Take by mouth daily.     [provider]  clopidogrel (PLAVIX) 75 MG tablet Take 1 tablet (75 mg  total) by mouth every morning. 11/18/13   Pixie Casino, MD  diazepam (VALIUM) 10 MG tablet Take 10 mg by mouth once. 04/10/20   [provider]  ibuprofen (ADVIL) 600 MG tablet Take 600 mg by mouth 3 (three) times daily. 02/18/20   [provider]  lisinopril (PRINIVIL,ZESTRIL) 20 MG tablet Take 20 mg by mouth 2 (two) times daily.  06/19/17   [provider]  nitroGLYCERIN (NITROSTAT) 0.4 MG SL tablet Place 1 tablet (0.4 mg total) under the tongue every 5 (five) minutes as needed for chest pain. 07/15/17 10/23/20  Herminio Commons, MD  Polyethyl Glycol-Propyl Glycol (SYSTANE OP) Apply 1 drop to  eye 2 (two) times daily.     [provider]  rosuvastatin (CRESTOR) 10 MG tablet Take 10 mg by mouth daily.  05/22/17   [provider]  SitaGLIPtin-MetFORMIN HCl (JANUMET XR) [phone] MG TB24 Take by mouth daily.     [provider]  triamcinolone (KENALOG) 0.025 % ointment Apply 1 application topically 2 (two) times daily. 01/15/21   Scot Jun, FNP    Family History Family History  Problem Relation Age of Onset   Heart disease Mother    Cancer Mother        Breast cancer    Social History Social History   Tobacco Use   Smoking status: Never   Smokeless tobacco: Never  Vaping Use   Vaping Use: Never used  Substance Use Topics   Alcohol use: No   Drug use: No     Allergies   Patient has no known allergies.   Review of Systems Review of Systems Pertinent negatives listed in HPI  Physical Exam Triage Vital Signs ED Triage Vitals  Enc Vitals Group     BP 07/16/21 1847 (!) 182/83     Pulse Rate 07/16/21 1847 (!) 52     Resp 07/16/21 1847 14     Temp 07/16/21 1847 (!) 97.3 F (36.3 C)     Temp Source 07/16/21 1847 Tympanic     SpO2 07/16/21 1847 96 %     Weight --      Height --      Head Circumference --      Peak Flow --      Pain Score 07/16/21 1851 0     Pain Loc --      Pain Edu? --      Excl. in [place]? --    No data found.  Updated Vital Signs BP (!) 182/83 (BP Location: Right Arm)   Pulse (!) 52   Temp (!) 97.3 F (36.3 C) (Tympanic)   Resp 14   SpO2 96%   Visual Acuity Right Eye Distance:   Left Eye Distance:   Bilateral Distance:    Right Eye Near:   Left Eye Near:    Bilateral Near:     Physical Exam General appearance: Alert, well developed, well nourished, cooperative, no distress Head: Normocephalic, without obvious abnormality, atraumatic Respiratory: Respirations even and unlabored, normal respiratory rate Heart: Rate and rhythm normal. No gallop or murmurs noted on exam  Extremities: No gross  deformities Skin: Skin color, texture, turgor normal. Positive erythematous macular rash  Psych: Appropriate mood and affect. Neurologic: GCS 15, normal coordination, normal gait  UC Treatments / Results  Labs (all labs ordered are listed, but only abnormal results are displayed) Labs Reviewed - No data to display  EKG   Radiology No results found.  Procedures Procedures (including  critical care time)  Medications Ordered in UC Medications  dexamethasone (DECADRON) injection 10 mg (10 mg Intramuscular Given 07/16/21 1920)    Initial Impression / Assessment and Plan / UC Course  I have reviewed the triage vital signs and the nursing notes.  Pertinent labs & imaging results that were available during my care of the patient were reviewed by me and considered in my medical decision making (see chart for details).    Irritant contact dermatitis Decadron IM.  Start oral prednisone tomorrow Continue steroid cream at home Final Clinical Impressions(s) / UC Diagnoses   Final diagnoses:  Irritant contact dermatitis, unspecified trigger   Discharge Instructions   None    ED Prescriptions     Medication Sig Dispense Auth. Provider   predniSONE (DELTASONE) 10 MG tablet Take 1 tablet (10 mg total) by mouth daily with breakfast for 5 days. 5 tablet Scot Jun, FNP      PDMP not reviewed this encounter.   Scot Jun, [place] 07/23/21 [phone]

## 2021-07-16 NOTE — ED Triage Notes (Signed)
Rash to LT side of neck , LT side of hand .  Rash is itchy.

## 2021-07-24 ENCOUNTER — Ambulatory Visit (INDEPENDENT_AMBULATORY_CARE_PROVIDER_SITE_OTHER): Payer: Medicare HMO | Admitting: Gastroenterology

## 2021-07-24 ENCOUNTER — Other Ambulatory Visit: Payer: Self-pay

## 2021-07-24 ENCOUNTER — Ambulatory Visit (INDEPENDENT_AMBULATORY_CARE_PROVIDER_SITE_OTHER): Payer: Medicare HMO | Admitting: Internal Medicine

## 2021-07-24 ENCOUNTER — Encounter (INDEPENDENT_AMBULATORY_CARE_PROVIDER_SITE_OTHER): Payer: Self-pay | Admitting: Gastroenterology

## 2021-07-24 VITALS — BP 167/85 | HR 60 | Temp 98.4°F | Ht 60.5 in | Wt 172.3 lb

## 2021-07-24 DIAGNOSIS — R1031 Right lower quadrant pain: Secondary | ICD-10-CM | POA: Diagnosis not present

## 2021-07-24 NOTE — Progress Notes (Signed)
Referring Provider: Redmond School, MD Primary Care Physician:  Redmond School, MD Primary GI Physician: Rehman  Chief Complaint  Patient presents with   Follow-up    Patient is here today for a follow up visit. She states she has no current GI issues. She has around two bm's per day and her appetite is good.   HPI:   [name] is a 75 y.o. female with past medical history of arthritis, CAD, DM, fatty liver, GERD, high cholesterol, and HTN.   Patient presenting today for follow up of her abdominal pain.   Abdominal Pain:RLQ that had previously been ongoing, on CT abd/pelvis in January 2021, there was soft tissue prominence in the region of the ileocecal valce with masslike appearance, however, last colonoscopy in feb 2021 was normal without any evidence of mass. She reprots resolution of RLQ pain. She does endorse some mild lower abd pain once per month around the time she would have normally gotten her menstrual cycle when she was younger. States she takes advil for this with relief. States that she normally has 1-2 BM per day, denies constipation or diarrhea. She denies melena or hematochezia, nausea, vomiting, reflux, weight loss, dysphagia.   States she had blood work done on 7/28 at PCP, she will have them sent over.   Last Colonoscopy:(01/14/20)- The examined portion of the ileum was normal. - Diverticulosis in the sigmoid colon. - No specimens collected. Last Endoscopy:n/a  Recommendations:  No further colonoscopy due to patient's age, unless otherwise clinically indicated  Past Medical History:  Diagnosis Date   Arthritis    CAD (coronary artery disease)    Cancer ([place])    Diabetes mellitus ([place])    type 2 x  1 or 2 yrs   Fatty liver    GERD (gastroesophageal reflux disease)    High cholesterol    Hypertension    Seasonal allergies     Past Surgical History:  Procedure Laterality Date   CARDIAC CATHETERIZATION  09/11/2007   100% occlusion of the left  circumflex. proceed with stenting   CARDIAC CATHETERIZATION  09/11/2007   mid circumflex occlusion with tandem 80% proximal LAD lesion to the 50% mid LAD lesion. nonDES stenting of the circumflex occlusion after recanalization and balloon dilatation, the stent was a 2.75x48m Liberte, TIMI3 flow was restored   CARDIOVASCULAR STRESS TEST  03/24/2012   normal pattern of perfusion in all regions, no scintigraphic evidence of inducible myocardial ischemia   CHOLECYSTECTOMY  2008   COLONOSCOPY  06/17/2012   Procedure: COLONOSCOPY;  Surgeon: NRogene Houston MD;  [facility];  Service: Endoscopy;  Laterality: N/A;  100   COLONOSCOPY WITH PROPOFOL N/A 01/14/2020   Procedure: COLONOSCOPY WITH PROPOFOL;  Surgeon: RRogene Houston MD;  Location: [facility];  Service: Endoscopy;  Laterality: N/A;  1[place] 07/20/2012   EF 60-65%, wall motion normal, there were no regional wall motion abnormalities   Left breast lumpectomy     LOWER EXTREMITY VENOUS DOPPLER Left 12/27/2011   no evidence of deep vein thrombosis involving the left lower extremity and right common femoral vein, enlarged inguinal lymph node is visualized on the left. no baker's cyst on the left.   PARTIAL HYSTERECTOMY      Current Outpatient Medications  Medication Sig Dispense Refill   amLODipine (NORVASC) 5 MG tablet Take 1 tablet (5 mg total) by mouth daily. 90 tablet 3   aspirin EC 81 MG tablet Take 81  mg by mouth every morning.     Cholecalciferol (VITAMIN D) 125 MCG (5000 UT) CAPS Take by mouth daily.      clopidogrel (PLAVIX) 75 MG tablet Take 1 tablet (75 mg total) by mouth every morning. 90 tablet 0   ibuprofen (ADVIL) 600 MG tablet Take 600 mg by mouth 3 (three) times daily.     lisinopril (PRINIVIL,ZESTRIL) 20 MG tablet Take 20 mg by mouth 2 (two) times daily.      nitroGLYCERIN (NITROSTAT) 0.4 MG SL tablet Place 1 tablet (0.4 mg total) under the tongue every 5 (five) minutes as needed for chest  pain. 25 tablet 3   Polyethyl Glycol-Propyl Glycol (SYSTANE OP) Apply 1 drop to eye 2 (two) times daily.      rosuvastatin (CRESTOR) 10 MG tablet Take 10 mg by mouth daily.      triamcinolone (KENALOG) 0.025 % ointment Apply 1 application topically 2 (two) times daily. 80 g 0   diazepam (VALIUM) 10 MG tablet Take 10 mg by mouth once. (Patient not taking: Reported on 07/24/2021)     No current facility-administered medications for this visit.    Allergies as of 07/24/2021   (No Known Allergies)    Family History  Problem Relation Age of Onset   Heart disease Mother    Cancer Mother        Breast cancer    Social History   Socioeconomic History   Marital status: Single    Spouse name: Not on file   Number of children: 1   Years of education: Not on file   Highest education level: Not on file  Occupational History   Not on file  Tobacco Use   Smoking status: Never   Smokeless tobacco: Never  Vaping Use   Vaping Use: Never used  Substance and Sexual Activity   Alcohol use: No   Drug use: No   Sexual activity: Not Currently    Birth control/protection: Surgical  Other Topics Concern   Not on file  Social History Narrative   Not on file   Social Determinants of Health   Financial Resource Strain: Not on file  Food Insecurity: Not on file  Transportation Needs: Not on file  Physical Activity: Not on file  Stress: Not on file  Social Connections: Not on file    Review of Systems: Gen: Denies fever, chills, anorexia. Denies fatigue, weakness, weight loss.  CV: Denies chest pain, palpitations, syncope, peripheral edema, and claudication. Resp: Denies dyspnea at rest, cough, wheezing, coughing up blood, and pleurisy. GI: Denies vomiting blood, jaundice, and fecal incontinence. Denies dysphagia or odynophagia. Endorses monthly lower abdominal pain around the time her menstrual cycle used to occur Derm: Denies rash, itching, dry skin Psych: Denies depression, anxiety,  memory loss, confusion. No homicidal or suicidal ideation.  Heme: Denies bruising, bleeding, and enlarged lymph nodes.  Physical Exam: BP (!) 167/85 (BP Location: Left Arm, Patient Position: Sitting, Cuff Size: Large)   Pulse 60   Temp 98.4 F (36.9 C) (Oral)   Ht 5' 0.5" (1.537 m)   Wt 172 lb 4.8 oz (78.2 kg)   BMI 33.10 kg/m  General:   Alert and oriented. No distress noted. Pleasant and cooperative.  Head:  Normocephalic and atraumatic. Eyes:  Conjuctiva clear without scleral icterus. Mouth:  Oral mucosa pink and moist. Good dentition. No lesions. Heart: Normal rate and rhythm, s1 and s2 heart sounds present.  Lungs: Clear lung sounds in all lobes. Respirations equal and unlabored.  Abdomen:  +BS, soft, non-tender and non-distended. No rebound or guarding. No HSM or masses noted. Derm: No palmar erythema or jaundice Msk:  Symmetrical without gross deformities. Normal posture. Extremities:  Without edema. Neurologic:  Alert and  oriented x4 Psych:  Alert and cooperative. Normal mood and affect.  ASSESSMENT: [name] is a 74 y.o. female presenting today for follow up of RLQ pain.  Patient reports she is no long having ongoing RLQ pain. She has some mild lower abdominal pain once a month around the time her menstrual cycle would occur previously. She takes advil for this with relief. She has no GI complaints today. Has 1-2 BMs per day without any intervention. Patient had recent labs at PCP that she was told were all WNL, she will have pcp office send those over so we can add them to her EMR.   No red flag symptoms. Patient denies melena, hematochezia, nausea, vomiting, diarrhea, constipation, dysphagia, odyonophagia, early satiety or weight loss.   PLAN:  Have pcp send over most recent labs for our records   Follow Up: PRN  This case was reviewed with Dr. Laural Golden, who is in agreement with plan of care as outlined above.   Lafreda Casebeer L. Alver Sorrow, MSN, APRN,  AGNP-C Adult-Gerontology Nurse Practitioner [facility]

## 2021-07-24 NOTE — Patient Instructions (Signed)
Please have your PCP send your most recent lab results to [place] when you get a chance. I am glad you are feeling better, we will plan to see you as needed.

## 2021-08-01 DIAGNOSIS — E7849 Other hyperlipidemia: Secondary | ICD-10-CM | POA: Diagnosis not present

## 2021-08-01 DIAGNOSIS — I10 Essential (primary) hypertension: Secondary | ICD-10-CM | POA: Diagnosis not present

## 2021-08-01 DIAGNOSIS — E11319 Type 2 diabetes mellitus with unspecified diabetic retinopathy without macular edema: Secondary | ICD-10-CM | POA: Diagnosis not present

## 2021-08-31 DIAGNOSIS — I10 Essential (primary) hypertension: Secondary | ICD-10-CM | POA: Diagnosis not present

## 2021-08-31 DIAGNOSIS — E782 Mixed hyperlipidemia: Secondary | ICD-10-CM | POA: Diagnosis not present

## 2021-08-31 DIAGNOSIS — E11319 Type 2 diabetes mellitus with unspecified diabetic retinopathy without macular edema: Secondary | ICD-10-CM | POA: Diagnosis not present

## 2021-09-20 DIAGNOSIS — E6609 Other obesity due to excess calories: Secondary | ICD-10-CM | POA: Diagnosis not present

## 2021-09-20 DIAGNOSIS — Z6830 Body mass index (BMI) 30.0-30.9, adult: Secondary | ICD-10-CM | POA: Diagnosis not present

## 2021-09-20 DIAGNOSIS — E11319 Type 2 diabetes mellitus with unspecified diabetic retinopathy without macular edema: Secondary | ICD-10-CM | POA: Diagnosis not present

## 2021-09-20 DIAGNOSIS — I2510 Atherosclerotic heart disease of native coronary artery without angina pectoris: Secondary | ICD-10-CM | POA: Diagnosis not present

## 2021-09-20 DIAGNOSIS — E782 Mixed hyperlipidemia: Secondary | ICD-10-CM | POA: Diagnosis not present

## 2021-09-20 DIAGNOSIS — I10 Essential (primary) hypertension: Secondary | ICD-10-CM | POA: Diagnosis not present

## 2021-09-20 DIAGNOSIS — E1165 Type 2 diabetes mellitus with hyperglycemia: Secondary | ICD-10-CM | POA: Diagnosis not present

## 2021-10-01 DIAGNOSIS — E11319 Type 2 diabetes mellitus with unspecified diabetic retinopathy without macular edema: Secondary | ICD-10-CM | POA: Diagnosis not present

## 2021-10-01 DIAGNOSIS — E782 Mixed hyperlipidemia: Secondary | ICD-10-CM | POA: Diagnosis not present

## 2021-10-01 DIAGNOSIS — I10 Essential (primary) hypertension: Secondary | ICD-10-CM | POA: Diagnosis not present

## 2021-11-29 ENCOUNTER — Observation Stay (HOSPITAL_BASED_OUTPATIENT_CLINIC_OR_DEPARTMENT_OTHER): Payer: Medicare HMO

## 2021-11-29 ENCOUNTER — Observation Stay (HOSPITAL_COMMUNITY)
Admission: EM | Admit: 2021-11-29 | Discharge: 2021-11-30 | Disposition: A | Discharge status: Home / Self Care | Payer: Medicare HMO | Attending: Internal Medicine | Admitting: Internal Medicine

## 2021-11-29 ENCOUNTER — Other Ambulatory Visit: Payer: Self-pay

## 2021-11-29 ENCOUNTER — Observation Stay (HOSPITAL_COMMUNITY): Payer: Medicare HMO

## 2021-11-29 ENCOUNTER — Emergency Department (HOSPITAL_COMMUNITY): Payer: Medicare HMO

## 2021-11-29 ENCOUNTER — Encounter (HOSPITAL_COMMUNITY): Payer: Self-pay | Admitting: Emergency Medicine

## 2021-11-29 DIAGNOSIS — J101 Influenza due to other identified influenza virus with other respiratory manifestations: Secondary | ICD-10-CM | POA: Diagnosis not present

## 2021-11-29 DIAGNOSIS — G459 Transient cerebral ischemic attack, unspecified: Secondary | ICD-10-CM

## 2021-11-29 DIAGNOSIS — R2689 Other abnormalities of gait and mobility: Secondary | ICD-10-CM | POA: Insufficient documentation

## 2021-11-29 DIAGNOSIS — E162 Hypoglycemia, unspecified: Secondary | ICD-10-CM

## 2021-11-29 DIAGNOSIS — I2510 Atherosclerotic heart disease of native coronary artery without angina pectoris: Secondary | ICD-10-CM | POA: Insufficient documentation

## 2021-11-29 DIAGNOSIS — R479 Unspecified speech disturbances: Secondary | ICD-10-CM | POA: Diagnosis not present

## 2021-11-29 DIAGNOSIS — Z8542 Personal history of malignant neoplasm of other parts of uterus: Secondary | ICD-10-CM | POA: Insufficient documentation

## 2021-11-29 DIAGNOSIS — Z79899 Other long term (current) drug therapy: Secondary | ICD-10-CM | POA: Insufficient documentation

## 2021-11-29 DIAGNOSIS — I160 Hypertensive urgency: Secondary | ICD-10-CM | POA: Diagnosis not present

## 2021-11-29 DIAGNOSIS — I10 Essential (primary) hypertension: Secondary | ICD-10-CM | POA: Insufficient documentation

## 2021-11-29 DIAGNOSIS — R531 Weakness: Secondary | ICD-10-CM | POA: Diagnosis not present

## 2021-11-29 DIAGNOSIS — Z20822 Contact with and (suspected) exposure to covid-19: Secondary | ICD-10-CM | POA: Diagnosis not present

## 2021-11-29 DIAGNOSIS — Z7982 Long term (current) use of aspirin: Secondary | ICD-10-CM | POA: Insufficient documentation

## 2021-11-29 DIAGNOSIS — G319 Degenerative disease of nervous system, unspecified: Secondary | ICD-10-CM | POA: Diagnosis not present

## 2021-11-29 DIAGNOSIS — I639 Cerebral infarction, unspecified: Secondary | ICD-10-CM | POA: Diagnosis not present

## 2021-11-29 DIAGNOSIS — E11649 Type 2 diabetes mellitus with hypoglycemia without coma: Secondary | ICD-10-CM | POA: Diagnosis not present

## 2021-11-29 DIAGNOSIS — Z7902 Long term (current) use of antithrombotics/antiplatelets: Secondary | ICD-10-CM | POA: Diagnosis not present

## 2021-11-29 DIAGNOSIS — R29818 Other symptoms and signs involving the nervous system: Secondary | ICD-10-CM | POA: Diagnosis not present

## 2021-11-29 LAB — APTT: aPTT: 26 seconds (ref 24–36)

## 2021-11-29 LAB — COMPREHENSIVE METABOLIC PANEL
ALT: 66 U/L — ABNORMAL HIGH (ref 0–44)
AST: 120 U/L — ABNORMAL HIGH (ref 15–41)
Albumin: 4.2 g/dL (ref 3.5–5.0)
Alkaline Phosphatase: 83 U/L (ref 38–126)
Anion gap: 9 (ref 5–15)
BUN: 12 mg/dL (ref 8–23)
CO2: 24 mmol/L (ref 22–32)
Calcium: 9.1 mg/dL (ref 8.9–10.3)
Chloride: 106 mmol/L (ref 98–111)
Creatinine, Ser: 0.8 mg/dL (ref 0.44–1.00)
GFR, Estimated: >60 mL/min (ref >60)
Glucose, Bld: 57 mg/dL — ABNORMAL LOW (ref 70–99)
Potassium: 3.5 mmol/L (ref 3.5–5.1)
Sodium: 139 mmol/L (ref 135–145)
Total Bilirubin: 0.4 mg/dL (ref 0.3–1.2)
Total Protein: 7.6 g/dL (ref 6.5–8.1)

## 2021-11-29 LAB — ECHOCARDIOGRAM COMPLETE
AR max vel: 3.88 cm2
AV Area VTI: 3.5 cm2
AV Area mean vel: 3.36 cm2
AV Mean grad: 3.5 mmHg
AV Peak grad: 6 mmHg
Ao pk vel: 1.22 m/s
Area-P 1/2: 2.66 cm2
Calc EF: 61.6 %
Height: 60.5 in
MV VTI: 4.38 cm2
S' Lateral: 2.2 cm
Single Plane A2C EF: 60.4 %
Single Plane A4C EF: 61.9 %
Weight: 2816 oz

## 2021-11-29 LAB — URINALYSIS, ROUTINE W REFLEX MICROSCOPIC
Bilirubin Urine: NEGATIVE
Glucose, UA: NEGATIVE mg/dL
Hgb urine dipstick: NEGATIVE
Ketones, ur: NEGATIVE mg/dL
Leukocytes,Ua: NEGATIVE
Nitrite: NEGATIVE
Protein, ur: NEGATIVE mg/dL
Specific Gravity, Urine: 1.01 (ref 1.005–1.030)
pH: 6 (ref 5.0–8.0)

## 2021-11-29 LAB — DIFFERENTIAL
Abs Immature Granulocytes: 0.02 10*3/uL (ref 0.00–0.07)
Basophils Absolute: 0 10*3/uL (ref 0.0–0.1)
Basophils Relative: 1 %
Eosinophils Absolute: 0 10*3/uL (ref 0.0–0.5)
Eosinophils Relative: 0 %
Immature Granulocytes: 0 %
Lymphocytes Relative: 17 %
Lymphs Abs: 0.9 10*3/uL (ref 0.7–4.0)
Monocytes Absolute: 0.5 10*3/uL (ref 0.1–1.0)
Monocytes Relative: 10 %
Neutro Abs: 3.8 10*3/uL (ref 1.7–7.7)
Neutrophils Relative %: 72 %

## 2021-11-29 LAB — CBC
HCT: 39.7 % (ref 36.0–46.0)
Hemoglobin: 12.8 g/dL (ref 12.0–15.0)
MCH: 29.5 pg (ref 26.0–34.0)
MCHC: 32.2 g/dL (ref 30.0–36.0)
MCV: 91.5 fL (ref 80.0–100.0)
Platelets: 170 10*3/uL (ref 150–400)
RBC: 4.34 MIL/uL (ref 3.87–5.11)
RDW: 12.4 % (ref 11.5–15.5)
WBC: 5.3 10*3/uL (ref 4.0–10.5)
nRBC: 0 % (ref 0.0–0.2)

## 2021-11-29 LAB — RESP PANEL BY RT-PCR (FLU A&B, COVID) ARPGX2
Influenza A by PCR: POSITIVE — AB
Influenza B by PCR: NEGATIVE
SARS Coronavirus 2 by RT PCR: NEGATIVE

## 2021-11-29 LAB — ETHANOL: Alcohol, Ethyl (B): <10 mg/dL (ref <10)

## 2021-11-29 LAB — RAPID URINE DRUG SCREEN, HOSP PERFORMED
Amphetamines: NOT DETECTED
Barbiturates: NOT DETECTED
Benzodiazepines: NOT DETECTED
Cocaine: NOT DETECTED
Opiates: NOT DETECTED
Tetrahydrocannabinol: NOT DETECTED

## 2021-11-29 LAB — CBG MONITORING, ED
Glucose-Capillary: 61 mg/dL — ABNORMAL LOW (ref 70–99)
Glucose-Capillary: 53 mg/dL — ABNORMAL LOW (ref 70–99)
Glucose-Capillary: 154 mg/dL — ABNORMAL HIGH (ref 70–99)

## 2021-11-29 LAB — PROTIME-INR
INR: 1 (ref 0.8–1.2)
Prothrombin Time: 13.1 seconds (ref 11.4–15.2)

## 2021-11-29 LAB — TSH: TSH: 0.528 u[IU]/mL (ref 0.350–4.500)

## 2021-11-29 MED ORDER — DEXTROSE 50 % IV SOLN
50.0000 mL | Freq: Once | INTRAVENOUS | Status: AC
  Administered 2021-11-29: 07:00:00 50 mL via INTRAVENOUS

## 2021-11-29 MED ORDER — DEXTROSE 50 % IV SOLN
INTRAVENOUS | Status: AC
  Filled 2021-11-29: qty 50

## 2021-11-29 MED ORDER — VITAMIN D 25 MCG (1000 UNIT) PO TABS
5000.0000 [IU] | ORAL_TABLET | Freq: Every day | ORAL | Status: DC
  Administered 2021-11-29 – 2021-11-30 (×2): 5000 [IU] via ORAL
  Filled 2021-11-29 (×2): qty 5

## 2021-11-29 MED ORDER — ROSUVASTATIN CALCIUM 10 MG PO TABS
10.0000 mg | ORAL_TABLET | Freq: Every day | ORAL | Status: DC
  Administered 2021-11-29 – 2021-11-30 (×2): 10 mg via ORAL
  Filled 2021-11-29 (×3): qty 1

## 2021-11-29 MED ORDER — VITAMIN D 125 MCG (5000 UT) PO CAPS: 1.0000 | ORAL_CAPSULE | Freq: Every day | ORAL | Status: DC

## 2021-11-29 MED ORDER — SODIUM CHLORIDE 0.9 % IV SOLN: INTRAVENOUS | Status: DC

## 2021-11-29 MED ORDER — CLOPIDOGREL BISULFATE 75 MG PO TABS
75.0000 mg | ORAL_TABLET | Freq: Every morning | ORAL | Status: DC
  Administered 2021-11-29 – 2021-11-30 (×2): 75 mg via ORAL
  Filled 2021-11-29 (×2): qty 1

## 2021-11-29 MED ORDER — ASPIRIN EC 81 MG PO TBEC
81.0000 mg | DELAYED_RELEASE_TABLET | Freq: Every morning | ORAL | Status: DC
  Administered 2021-11-29 – 2021-11-30 (×2): 81 mg via ORAL
  Filled 2021-11-29 (×2): qty 1

## 2021-11-29 MED ORDER — ACETAMINOPHEN 650 MG RE SUPP: 650.0000 mg | RECTAL | Status: DC | PRN

## 2021-11-29 MED ORDER — LISINOPRIL 10 MG PO TABS
20.0000 mg | ORAL_TABLET | Freq: Every day | ORAL | Status: DC
  Administered 2021-11-29 – 2021-11-30 (×2): 20 mg via ORAL
  Filled 2021-11-29 (×2): qty 2

## 2021-11-29 MED ORDER — AMLODIPINE BESYLATE 5 MG PO TABS
5.0000 mg | ORAL_TABLET | Freq: Every day | ORAL | Status: DC
  Administered 2021-11-29 – 2021-11-30 (×2): 5 mg via ORAL
  Filled 2021-11-29 (×2): qty 1

## 2021-11-29 MED ORDER — ACETAMINOPHEN 160 MG/5ML PO SOLN: 650.0000 mg | ORAL | Status: DC | PRN

## 2021-11-29 MED ORDER — ENOXAPARIN SODIUM 40 MG/0.4ML IJ SOSY
40.0000 mg | PREFILLED_SYRINGE | INTRAMUSCULAR | Status: DC
  Administered 2021-11-29 – 2021-11-30 (×2): 40 mg via SUBCUTANEOUS
  Filled 2021-11-29 (×2): qty 0.4

## 2021-11-29 MED ORDER — IOHEXOL 350 MG/ML SOLN
80.0000 mL | Freq: Once | INTRAVENOUS | Status: AC | PRN
  Administered 2021-11-29: 08:00:00 80 mL via INTRAVENOUS

## 2021-11-29 MED ORDER — ACETAMINOPHEN 325 MG PO TABS: 650.0000 mg | ORAL_TABLET | ORAL | Status: DC | PRN

## 2021-11-29 MED ORDER — HYDRALAZINE HCL 20 MG/ML IJ SOLN: 10.0000 mg | INTRAMUSCULAR | Status: DC | PRN

## 2021-11-29 MED ORDER — STROKE: EARLY STAGES OF RECOVERY BOOK
Freq: Once | Status: AC
  Filled 2021-11-29: qty 1

## 2021-11-29 NOTE — ED Notes (Signed)
Pt back from CT

## 2021-11-29 NOTE — ED Notes (Signed)
Notified Dr. Manuella Ghazi that patient passed Stroke Swallowing Screen and needs diet order.

## 2021-11-29 NOTE — ED Notes (Signed)
Patient in MRI, daughter at bedside and family called.

## 2021-11-29 NOTE — Consult Note (Signed)
I connected with  [name] on 11/29/21 by a video enabled telemedicine application and verified that I am speaking with the correct person using two identifiers.   I discussed the limitations of evaluation and management by telemedicine. The patient expressed understanding and agreed to proceed.  Location of patient: [facility] Location of physician: [facility]  Neurology Consultation Reason for Consult: Generalized weakness and dysarthria Referring Physician: Dr. Heath Lark  CC: Generalized weakness and dysarthria  History is obtained from: Patient, chart review  HPI: [name] is a 75 y.o. female with past medical history of hypertension, type 2 diabetes, hyperlipidemia, coronary artery disease, uterine cancer and arthritis who presented with complaints of generalized weakness and dysarthria.  Patient states she went to bed around 2300 last night.  This morning when she woke up around 0500, she felt weak all over, could not lift her arms or legs of the bed.  Therefore she called EMS and brought to [facility], ED where stroke alert was called.  Of note, patient states she takes aspirin 81 mg daily and has had difficulty controlling her blood pressure and blood sugar, reports medication compliance.  Denies any prior strokes, similar symptoms in the past  Last known normal 2300 on 11/28/2021 Location of patient was home Blood pressure on arrival 199/81, blood glucose 61 tPA was not administered as patient was outside window No thrombectomy as no large vessel occlusion  ROS: All other systems reviewed and negative except as noted in the HPI.   Past Medical History:  Diagnosis Date   Arthritis    CAD (coronary artery disease)    Cancer ([place])    Diabetes mellitus ([place])    type 2 x  1 or 2 yrs   Fatty liver    GERD (gastroesophageal reflux disease)    High cholesterol    Hypertension    Seasonal allergies      Family History  Problem Relation Age of  Onset   Heart disease Mother    Cancer Mother        Breast cancer     Social History:  reports that she has never smoked. She has never used smokeless tobacco. She reports that she does not drink alcohol and does not use drugs.    Exam: Current vital signs: BP (!) 203/75    Pulse 63    Temp 98.2 F (36.8 C) (Oral)    Resp (!) 21    Ht 5' 0.5" (1.537 m)    Wt 79.8 kg    SpO2 99%    BMI 33.81 kg/m  Vital signs in last 24 hours: Temp:  [98.2 F (36.8 C)] 98.2 F (36.8 C) (12/29 0550) Pulse Rate:  [61-74] 63 (12/29 0930) Resp:  [14-24] 21 (12/29 0930) BP: (196-215)/(63-88) 203/75 (12/29 0930) SpO2:  [97 %-100 %] 99 % (12/29 0930) Weight:  [79.8 kg] 79.8 kg (12/29 0547)   Physical Exam  Constitutional: Appears well-developed and well-nourished.  Psych: Affect appropriate to situation Eyes: No scleral injection HENT: No OP obstrucion Head: Normocephalic.  Cardiovascular: Normal rate and regular rhythm.  Respiratory: Effort normal, non-labored breathing Neuro: AOx3, cranial nerves General grossly intact, antigravity strength in all 4 extremities without drift, FTN intact bilaterally   I have reviewed labs in epic and the results pertinent to this consultation are: CBC:  Recent Labs  Lab 11/29/21 0611  WBC 5.3  NEUTROABS 3.8  HGB 12.8  HCT 39.7  MCV 91.5  PLT 170  Basic Metabolic Panel:  Lab Results  Component Value Date   NA 139 11/29/2021   K 3.5 11/29/2021   CO2 24 11/29/2021   GLUCOSE 57 (L) 11/29/2021   BUN 12 11/29/2021   CREATININE 0.80 11/29/2021   CALCIUM 9.1 11/29/2021   GFRNONAA >60 11/29/2021   GFRAA >60 05/08/2020   Lipid Panel:  Lab Results  Component Value Date   LDLCALC 147 (H) 10/22/2016   HgbA1c:  Lab Results  Component Value Date   HGBA1C (H) 09/12/2007    6.3 (NOTE)  Therapeutic target for the treatment of diabetes mellitus patients is <7% HbA1c.  American Diabetes Association Diabetes Care 2002;25:S33-S49.   Urine Drug  Screen: No results found for: LABOPIA, [place], LABBENZ, [place], THCU, LABBARB  Alcohol Level     Component Value Date/Time   ETH <10 11/29/2021 0611     I have reviewed the images obtained:  CT head without contrast 12/29/202: No acute abnormality CTA head and neck with and without contrast 12/29/202: No large vessel occlusion, no significant stenosis in bilateral carotid arteries MRI brain without contrast 11/28/2021: No acute infarct.   ASSESSMENT/PLAN: 75 old female who presented with generalized weakness and speech difficulty  Generalized weakness Transient speech difficulty Hypertensive urgency - Differential of this episode include transient encephalopathy and speech difficulty due to hypertensive urgency versus hypoglycemia versus less likely TIA (generalized weakness rather than focal)  Recommendations: -Gradual improvement in blood pressure with goal SBP less than 140 over the next 24 hours -Continue aspirin 81 mg daily and plavix 75mg  daily for stroke prevention -Continue rosuvastatin 10mg  daily for stroke prevention. If LDL>75 can increase to 20mg  daily -PT/OT  Thank you for allowing [place] to participate in the care of this patient. If you have any further questions, please contact  me or neurohospitalist.   Zeb Comfort [facility] neurohospitalist

## 2021-11-29 NOTE — ED Provider Notes (Signed)
Care of the patient assumed at the change of shift. Here for stroke like symptoms, since resolved. Felt to be TIA. Awaiting CTA and if neg for LVO, can be admitted her.  Physical Exam  BP (!) 205/88    Pulse 65    Temp 98.2 F (36.8 C) (Oral)    Resp (!) 24    Ht 5' 0.5" (1.537 m)    Wt 79.8 kg    SpO2 98%    BMI 33.81 kg/m   Physical Exam Awake alert Normal speech No obvious focal deficit ED Course/Procedures   Clinical Course as of 11/29/21 0905  Thu Nov 29, 2021  0744 CBC is normal. CMP is neg. Flu is positive. CT head neg, per Neuro recommendations, will send for CTA head/neck.  [CS]  0745 Coags normal.  [CS]  3007 CTA neg. Will discuss admission with Hospitalist.  [CS]  [phone] Spoke with Dr. Manuella Ghazi, Hospitalist, who will evaluate for admission.  [CS]    Clinical Course User Index [CS] Truddie Hidden, MD    Procedures  MDM         Truddie Hidden, MD 11/29/21 [phone]

## 2021-11-29 NOTE — ED Triage Notes (Signed)
Pt states when she went to bed last night she was fine at 2330, but when she woke up she could not move her arms or legs due to weakness.

## 2021-11-29 NOTE — ED Notes (Signed)
Pt given lunch tray.

## 2021-11-29 NOTE — ED Notes (Signed)
Patient denies pain and is resting comfortably.  

## 2021-11-29 NOTE — Progress Notes (Signed)
SLP Cancellation Note  Patient Details Name: CALISA LUCKENBAUGH MRN: [id number] DOB: 26-Sep-1946   Cancelled treatment:       Reason Eval/Treat Not Completed: SLP screened, no needs identified, will sign off.  Thank you,  Genene Churn, [place]    [place] 11/29/2021, 2:28 PM

## 2021-11-29 NOTE — ED Notes (Signed)
Patient transported to CT 

## 2021-11-29 NOTE — ED Notes (Signed)
Neuro cart at bedside for MD to evaluate.  Patient back from MRI and hooked back up to monitoring.

## 2021-11-29 NOTE — Evaluation (Signed)
Physical Therapy Evaluation Patient Details Name: [name] MRN: [id number] DOB: 1946-05-20 Today's Date: 11/29/2021  History of Present Illness  [name] is a 75 y.o. female with medical history significant for hypertension, dyslipidemia, type 2 diabetes, GERD, CAD, uterine cancer, and arthritis who presented to the ED with complaints of generalized weakness and some speech difficulty.  She noticed this as soon as she woke up and noticed that she could not get out of bed and go to the bathroom.  Last known normal was 2300 last night.  She denies any headaches, diplopia, nausea, vomiting, fevers, or chills.  She has had no prior CVAs.  She states that her blood pressure at home is typically well controlled and she is noted to have some borderline low blood glucose levels.  She had called EMS and they had noted that her Accu-Chek was in the 80s.   Clinical Impression  Patient functioning near baseline for functional mobility and gait demonstrating good return for ambulation in room and hallways without loss of balance with slightly slower than normal cadence.  Patient encouraged to ambulate ad lib for length for length of hospital stay.  Plan:  Patient discharged from physical therapy to care of nursing for ambulation daily as tolerated for length of stay.         Recommendations for follow up therapy are one component of a multi-disciplinary discharge planning process, led by the attending physician.  Recommendations may be updated based on patient status, additional functional criteria and insurance authorization.  Follow Up Recommendations No PT follow up    Assistance Recommended at Discharge PRN  Functional Status Assessment Patient has not had a recent decline in their functional status  Equipment Recommendations  None recommended by PT    Recommendations for Other Services       Precautions / Restrictions Precautions Precautions: None Restrictions Weight Bearing  Restrictions: No      Mobility  Bed Mobility Overal bed mobility: Independent                  Transfers Overall transfer level: Modified independent                 General transfer comment: demonstrates good return without use of an AD    Ambulation/Gait Ambulation/Gait assistance: Modified independent (Device/Increase time) Gait Distance (Feet): 100 Feet Assistive device: None Gait Pattern/deviations: WFL(Within Functional Limits) Gait velocity: decreased     General Gait Details: grossly WFL demonstrating good return for ambulating in room and hallways without loss of balance  Stairs            Wheelchair Mobility    Modified Rankin (Stroke Patients Only)       Balance Overall balance assessment: No apparent balance deficits (not formally assessed)                                           Pertinent Vitals/Pain Pain Assessment: No/denies pain    Home Living Family/patient expects to be discharged to:: Private residence Living Arrangements: Alone Available Help at Discharge: Family;Available PRN/intermittently Type of Home: House Home Access: Level entry     Alternate Level Stairs-Number of Steps: 5-6 (Patient states she rarely goes up stairs) Home Layout: Two level;Able to live on main level with bedroom/bathroom;Full bath on main level Home Equipment: Cane - single point      Prior Function  Prior Level of Function : Independent/Modified Independent             Mobility Comments: Hydrographic surveyor, drives, works as a Actuary ADLs Comments: Independent     Hand Dominance        Extremity/Trunk Assessment   Upper Extremity Assessment Upper Extremity Assessment: Defer to OT evaluation    Lower Extremity Assessment Lower Extremity Assessment: Overall WFL for tasks assessed    Cervical / Trunk Assessment Cervical / Trunk Assessment: Normal  Communication   Communication: No difficulties  Cognition  Arousal/Alertness: Awake/alert Behavior During Therapy: WFL for tasks assessed/performed Overall Cognitive Status: Within Functional Limits for tasks assessed                                          General Comments      Exercises     Assessment/Plan    PT Assessment Patient does not need any further PT services  PT Problem List         PT Treatment Interventions      PT Goals (Current goals can be found in the Care Plan section)  Acute Rehab PT Goals Patient Stated Goal: return home PT Goal Formulation: With patient Potential to Achieve Goals: Good    Frequency     Barriers to discharge        Co-evaluation               AM-PAC PT "6 Clicks" Mobility  Outcome Measure Help needed turning from your back to your side while in a flat bed without using bedrails?: None Help needed moving from lying on your back to sitting on the side of a flat bed without using bedrails?: None Help needed moving to and from a bed to a chair (including a wheelchair)?: None Help needed standing up from a chair using your arms (e.g., wheelchair or bedside chair)?: None Help needed to walk in hospital room?: None Help needed climbing 3-5 steps with a railing? : None 6 Click Score: 24    End of Session   Activity Tolerance: Patient tolerated treatment well Patient left: in bed;with call bell/phone within reach Nurse Communication: Mobility status PT Visit Diagnosis: Unsteadiness on feet (R26.81);Other abnormalities of gait and mobility (R26.89);Muscle weakness (generalized) (M62.81)    Time: 1430-1450 PT Time Calculation (min) (ACUTE ONLY): 20 min   Charges:   PT Evaluation $PT Eval Moderate Complexity: 1 Mod PT Treatments $Therapeutic Activity: 8-22 mins        3:53 PM, 11/29/21 Lonell Grandchild, MPT Physical Therapist with [facility] [address] [phone] office [phone] mobile phone

## 2021-11-29 NOTE — Progress Notes (Signed)
*  PRELIMINARY RESULTS* Echocardiogram 2D Echocardiogram has been performed.  [name] 11/29/2021, 4:10 PM

## 2021-11-29 NOTE — Consult Note (Signed)
[facility] TeleNeurology Consult Services  Stat Consult  Patient Name:   [name], [name] Date of Birth:   [birth year] Identification Number:   MRN - [id number] Date of Service:   11/29/2021 06:04:12  Diagnosis:       G45.9 - Transient cerebral ischemic attack, unspecified  Impression Pt is a 38 YOF with PMH of HTN, HLD, DM II, GERD, CAD, UTERINE CANCER, ARTHRITIS who presented with complaint of generalized weakness, speech difficulty. NIHSS: 0. Deferred thrombolytics. Admit for TIA/STROKE workup.  Monitor neuro checks/VS q4h with telemetry. Recommend fall precautions. Goal SBP b/w 160-180 today, 140-160 -> 100-140 afterwards. Start ASA + PLAVIX + STATIN if no contraindications. Get MRI BRAIN W/O, CTA HEAD/NECK, and ECHO. Get COVID, UDS, ETOH, ESR/CRP, TROP, CK, TSH, B12, BNP, LACTIC ACID, LIPID PANEL, and A1C. Get WORKUP for TOXIC/METABOLIC/INFECTIOUS causes. PT/OT/ST eval.   CT HEAD: Showed No Acute Hemorrhage or Acute Core Infarct, chronic microvascular changes.  Our recommendations are outlined below.  Diagnostic Studies: Recommend MRI brain without contrast Routine CTA head and neck visualization of vasculature Transthoracic Echo with bubble study, if available  Laboratory Studies: Recommend Lipid panel Hemoglobin A1c TSH  Medications: Initiate Aspirin 81 mg daily Initiate Clopidogrel 75 mg daily Statins for LDL goal less than 70  Nursing Recommendations: Telemetry, IV Fluids, avoid dextrose containing fluids, Maintain euglycemia Neuro checks q4 hrs x 24 hrs and then per shift Head of bed 30 degrees  Consultations: Recommend Speech therapy if failed dysphagia screen Physical therapy/Occupational therapy  DVT Prophylaxis: Choice of Primary Team SCDs, Pneumatic Compression  Disposition: Neurology will follow   Metrics: [facility] Notification Time: 11/29/2021 06:02:38 Stamp Time: 11/29/2021 06:04:12 Callback Response Time: 11/29/2021  06:04:49   ----------------------------------------------------------------------------------------------------  Chief Complaint: generalized weakness, speech difficulty  History of Present Illness: Patient is a 75 year old Female. Pt is a 33 YOF with PMH of HTN, HLD, DM II, GERD, CAD, UTERINE CANCER, ARTHRITIS who presented with complaint of generalized weakness, speech difficulty. She stated she couldn't get out of bed in AM to goto bathroom. Her last known normal was around 2300 last night. She had high BP and borderline low glu. She stated EMS checked it and was in 107s. No prior strokes.     Past Medical History:      Hypertension      Diabetes Mellitus      Hyperlipidemia  Medications:  No Anticoagulant use  Antiplatelet use: Yes ASA + PLAVIX Reviewed EMR for current medications  Allergies:  Reviewed  Social History: Smoking: No Alcohol Use: No Drug Use: No  Family History:  There Is Family History Of: Mother: Breast C  ROS : 14 Points Review of Systems was performed and was negative except mentioned in HPI.  Past Surgical History: There Is No Surgical History Contributory To Todays Visit   Examination: BP(199//81), Pulse(74), Blood Glucose(61) 1A: Level of Consciousness - Alert; keenly responsive + 0 1B: Ask Month and Age - Both Questions Right + 0 1C: Blink Eyes & Squeeze Hands - Performs Both Tasks + 0 2: Test Horizontal Extraocular Movements - Normal + 0 3: Test Visual Fields - No Visual Loss + 0 4: Test Facial Palsy (Use Grimace if Obtunded) - Normal symmetry + 0 5A: Test Left Arm Motor Drift - No Drift for 10 Seconds + 0 5B: Test Right Arm Motor Drift - No Drift for 10 Seconds + 0 6A: Test Left Leg Motor Drift - No Drift for 5 Seconds + 0 6B: Test Right Leg Motor Drift -  No Drift for 5 Seconds + 0 7: Test Limb Ataxia (FNF/Heel-Shin) - No Ataxia + 0 8: Test Sensation - Normal; No sensory loss + 0 9: Test Language/Aphasia - Normal; No aphasia +  0 10: Test Dysarthria - Normal + 0 11: Test Extinction/Inattention - No abnormality + 0  NIHSS Score: 0     Patient / Family was informed the Neurology Consult would occur via TeleHealth consult by way of interactive audio and video telecommunications and consented to receiving care in this manner.  Patient is being evaluated for possible acute neurologic impairment and high probability of imminent or life - threatening deterioration.I spent total of 35 minutes providing care to this patient, including time for face to face visit via telemedicine, review of medical records, imaging studies and discussion of findings with providers, the patient and / or family.   Dr Currie Paris   [facility] [phone]  Case [id number]

## 2021-11-29 NOTE — H&P (Signed)
History and Physical    [name]:[id number] DOB: 02-01-1946 DOA: 11/29/2021  PCP: [name], [name]   Patient coming from: Home  Chief Complaint: Generalized weakness, speech difficulty  HPI: [name] is a 75 y.o. female with medical history significant for hypertension, dyslipidemia, type 2 diabetes, GERD, CAD, uterine cancer, and arthritis who presented to the ED with complaints of generalized weakness and some speech difficulty.  She noticed this as soon as she woke up and noticed that she could not get out of bed and go to the bathroom.  Last known normal was 2300 last night.  She denies any headaches, diplopia, nausea, vomiting, fevers, or chills.  She has had no prior CVAs.  She states that her blood pressure at home is typically well controlled and she is noted to have some borderline low blood glucose levels.  She had called EMS and they had noted that her Accu-Chek was in the 80s.   ED Course: Vital signs with elevated blood pressure readings with systolic in the 694 range.  Teleneurology consultation recommending work-up with brain MRI, 2D echocardiogram, hemoglobin A1c, and lipid panel.  Patient states that she is back to baseline at this time and has no further weakness or speech difficulty.  Review of Systems: Reviewed as noted above, otherwise negative.  Past Medical History:  Diagnosis Date   Arthritis    CAD (coronary artery disease)    Cancer ([place])    Diabetes mellitus ([place])    type 2 x  1 or 2 yrs   Fatty liver    GERD (gastroesophageal reflux disease)    High cholesterol    Hypertension    Seasonal allergies     Past Surgical History:  Procedure Laterality Date   CARDIAC CATHETERIZATION  09/11/2007   100% occlusion of the left circumflex. proceed with stenting   CARDIAC CATHETERIZATION  09/11/2007   mid circumflex occlusion with tandem 80% proximal LAD lesion to the 50% mid LAD lesion. nonDES stenting of the circumflex occlusion after recanalization  and balloon dilatation, the stent was a 2.75x32mm Liberte, TIMI3 flow was restored   CARDIOVASCULAR STRESS TEST  03/24/2012   normal pattern of perfusion in all regions, no scintigraphic evidence of inducible myocardial ischemia   CHOLECYSTECTOMY  2008   COLONOSCOPY  06/17/2012   Procedure: COLONOSCOPY;  Surgeon: [name];  [facility]: [facility];  Service: Endoscopy;  Laterality: N/A;  100   COLONOSCOPY WITH PROPOFOL N/A 01/14/2020   Procedure: COLONOSCOPY WITH PROPOFOL;  Surgeon: [name];  Location: [facility];  Service: Endoscopy;  Laterality: N/A;  [place]  07/20/2012   EF 60-65%, wall motion normal, there were no regional wall motion abnormalities   Left breast lumpectomy     LOWER EXTREMITY VENOUS DOPPLER Left 12/27/2011   no evidence of deep vein thrombosis involving the left lower extremity and right common femoral vein, enlarged inguinal lymph node is visualized on the left. no baker's cyst on the left.   PARTIAL HYSTERECTOMY       reports that she has never smoked. She has never used smokeless tobacco. She reports that she does not drink alcohol and does not use drugs.  No Known Allergies  Family History  Problem Relation Age of Onset   Heart disease Mother    Cancer Mother        Breast cancer    Prior to Admission medications   Medication Sig Start Date  End Date Taking? Authorizing Provider  aspirin EC 81 MG tablet Take 81 mg by mouth every morning.   Yes Provider, Historical, [name]  Cholecalciferol (VITAMIN D) 125 MCG (5000 UT) CAPS Take 1 tablet by mouth daily.   Yes Provider, Historical, [name]  clopidogrel (PLAVIX) 75 MG tablet Take 1 tablet (75 mg total) by mouth every morning. 11/18/13  Yes [name]  glipiZIDE (GLUCOTROL XL) 10 MG 24 hr tablet Take 1 tablet by mouth daily. 10/29/21  Yes Provider, Historical, [name]  JANUMET XR [phone] MG TB24 Take 1 tablet by mouth daily. 09/19/21  Yes Provider, Historical, [name]   lisinopril (PRINIVIL,ZESTRIL) 20 MG tablet Take 20 mg by mouth daily. 06/19/17  Yes Provider, Historical, [name]  nitroGLYCERIN (NITROSTAT) 0.4 MG SL tablet Place 1 tablet (0.4 mg total) under the tongue every 5 (five) minutes as needed for chest pain. 07/15/17 11/29/21 Yes [name]  Polyethyl Glycol-Propyl Glycol (SYSTANE OP) Apply 1 drop to eye 2 (two) times daily.    Yes Provider, Historical, [name]  rosuvastatin (CRESTOR) 10 MG tablet Take 10 mg by mouth daily.  05/22/17  Yes Provider, Historical, [name]  amLODipine (NORVASC) 5 MG tablet Take 1 tablet (5 mg total) by mouth daily. Patient not taking: Reported on 11/29/2021 10/27/19 07/24/21  [name]  triamcinolone (KENALOG) 0.025 % ointment Apply 1 application topically 2 (two) times daily. Patient not taking: Reported on 11/29/2021 01/15/21   Scot Jun, FNP    Physical Exam: Vitals:   11/29/21 0700 11/29/21 0730 11/29/21 0800 11/29/21 0830  BP: (!) 203/63 (!) 204/73 (!) 215/80 (!) 205/88  Pulse: 61 71 65 65  Resp: 19 (!) 22 (!) 22 (!) 24  Temp:      TempSrc:      SpO2: 98% 98% 99% 98%  Weight:      Height:        Constitutional: NAD, calm, comfortable Vitals:   11/29/21 0700 11/29/21 0730 11/29/21 0800 11/29/21 0830  BP: (!) 203/63 (!) 204/73 (!) 215/80 (!) 205/88  Pulse: 61 71 65 65  Resp: 19 (!) 22 (!) 22 (!) 24  Temp:      TempSrc:      SpO2: 98% 98% 99% 98%  Weight:      Height:       Eyes: lids and conjunctivae normal Neck: normal, supple Respiratory: clear to auscultation bilaterally. Normal respiratory effort. No accessory muscle use.  Cardiovascular: Regular rate and rhythm, no murmurs. Abdomen: no tenderness, no distention. Bowel sounds positive.  Musculoskeletal:  No edema. Skin: no rashes, lesions, ulcers.  Psychiatric: Flat affect  Labs on Admission: I have personally reviewed following labs and imaging studies  CBC: Recent Labs  Lab 11/29/21 0611  WBC 5.3  NEUTROABS 3.8   HGB 12.8  HCT 39.7  MCV 91.5  PLT 465   Basic Metabolic Panel: Recent Labs  Lab 11/29/21 0611  NA 139  K 3.5  CL 106  CO2 24  GLUCOSE 57*  BUN 12  CREATININE 0.80  CALCIUM 9.1   GFR: Estimated Creatinine Clearance: 58.3 mL/min (by C-G formula based on SCr of 0.8 mg/dL). Liver Function Tests: Recent Labs  Lab 11/29/21 0611  AST 120*  ALT 66*  ALKPHOS 83  BILITOT 0.4  PROT 7.6  ALBUMIN 4.2   No results for input(s): LIPASE, AMYLASE in the last 168 hours. No results for input(s): AMMONIA in the last 168 hours. Coagulation Profile: Recent Labs  Lab 11/29/21 [phone]  INR 1.0   Cardiac Enzymes: No results for input(s): CKTOTAL, CKMB, CKMBINDEX, TROPONINI in the last 168 hours. BNP (last 3 results) No results for input(s): PROBNP in the last 8760 hours. HbA1C: No results for input(s): HGBA1C in the last 72 hours. CBG: Recent Labs  Lab 11/29/21 0620 11/29/21 0645 11/29/21 0738  GLUCAP 61* 53* 154*   Lipid Profile: No results for input(s): CHOL, HDL, LDLCALC, TRIG, CHOLHDL, LDLDIRECT in the last 72 hours. Thyroid Function Tests: No results for input(s): TSH, T4TOTAL, FREET4, T3FREE, THYROIDAB in the last 72 hours. Anemia Panel: No results for input(s): VITAMINB12, FOLATE, FERRITIN, TIBC, IRON, RETICCTPCT in the last 72 hours. Urine analysis:    Component Value Date/Time   BILIRUBINUR neg 03/27/2020 0841   PROTEINUR Positive (A) 03/27/2020 0841   UROBILINOGEN 0.2 03/27/2020 0841   NITRITE positive 03/27/2020 0841   LEUKOCYTESUR Moderate (2+) (A) 03/27/2020 0841    Radiological Exams on Admission: CT Angio Head W or Wo Contrast  Result Date: 11/29/2021 CLINICAL DATA:  Stroke follow-up. Frozen feeling earlier today, now resolved. EXAM: CT ANGIOGRAPHY HEAD AND NECK TECHNIQUE: Multidetector CT imaging of the head and neck was performed using the standard protocol during bolus administration of intravenous contrast. Multiplanar CT image reconstructions and  MIPs were obtained to evaluate the vascular anatomy. Carotid stenosis measurements (when applicable) are obtained utilizing NASCET criteria, using the distal internal carotid diameter as the denominator. CONTRAST:  42mL OMNIPAQUE IOHEXOL 350 MG/ML SOLN COMPARISON:  Head CT earlier today FINDINGS: CTA NECK FINDINGS Aortic arch: Normal arch.  Two vessel branching. Right carotid system: No evidence of dissection, stenosis (50% or greater) or occlusion. Left carotid system: No evidence of dissection, stenosis (50% or greater) or occlusion. Vertebral arteries: No proximal subclavian stenosis. Hypoplastic left vertebral artery which is fenestrated at the V3 segment. Skeleton: Generalized cervical spine degeneration. Other neck: No evidence of mass or inflammation. Small thyroid nodules measuring up to 9 mm on the left. No followup recommended (ref: J Am Coll Radiol. 2015 Feb;12(2): 143-50). Upper chest: Negative Review of the MIP images confirms the above findings CTA HEAD FINDINGS Anterior circulation: Vessels are smooth and widely patent. Negative for aneurysm. No significant atheromatous changes, especially for age. Posterior circulation: Right dominant vertebral artery with only trace contribution to the basilar from the left. No branch occlusion, beading, or aneurysm. Venous sinuses: Diffusely patent Anatomic variants: None significant Review of the MIP images confirms the above findings IMPRESSION: No emergent finding or explanation for symptoms. Electronically Signed   By: Jorje Guild M.D.   On: 11/29/2021 08:47   CT HEAD WO CONTRAST  Result Date: 11/29/2021 CLINICAL DATA:  Neuro deficit with acute stroke suspected EXAM: CT HEAD WITHOUT CONTRAST TECHNIQUE: Contiguous axial images were obtained from the base of the skull through the vertex without intravenous contrast. COMPARISON:  None. FINDINGS: Brain: No evidence of acute infarction, hemorrhage, hydrocephalus, extra-axial collection or mass lesion/mass  effect. Patchy white matter low-density consistent with chronic small vessel ischemia. Central predominant cerebral volume loss Vascular: No hyperdense vessel or unexpected calcification. Skull: Normal. Negative for fracture or focal lesion. Sinuses/Orbits: Negative IMPRESSION: Aging brain without acute or focal finding. Electronically Signed   By: Jorje Guild M.D.   On: 11/29/2021 07:15   CT Angio Neck W and/or Wo Contrast  Result Date: 11/29/2021 CLINICAL DATA:  Stroke follow-up. Frozen feeling earlier today, now resolved. EXAM: CT ANGIOGRAPHY HEAD AND NECK TECHNIQUE: Multidetector CT imaging of the head and neck was performed using the standard  protocol during bolus administration of intravenous contrast. Multiplanar CT image reconstructions and MIPs were obtained to evaluate the vascular anatomy. Carotid stenosis measurements (when applicable) are obtained utilizing NASCET criteria, using the distal internal carotid diameter as the denominator. CONTRAST:  68mL OMNIPAQUE IOHEXOL 350 MG/ML SOLN COMPARISON:  Head CT earlier today FINDINGS: CTA NECK FINDINGS Aortic arch: Normal arch.  Two vessel branching. Right carotid system: No evidence of dissection, stenosis (50% or greater) or occlusion. Left carotid system: No evidence of dissection, stenosis (50% or greater) or occlusion. Vertebral arteries: No proximal subclavian stenosis. Hypoplastic left vertebral artery which is fenestrated at the V3 segment. Skeleton: Generalized cervical spine degeneration. Other neck: No evidence of mass or inflammation. Small thyroid nodules measuring up to 9 mm on the left. No followup recommended (ref: J Am Coll Radiol. 2015 Feb;12(2): 143-50). Upper chest: Negative Review of the MIP images confirms the above findings CTA HEAD FINDINGS Anterior circulation: Vessels are smooth and widely patent. Negative for aneurysm. No significant atheromatous changes, especially for age. Posterior circulation: Right dominant vertebral  artery with only trace contribution to the basilar from the left. No branch occlusion, beading, or aneurysm. Venous sinuses: Diffusely patent Anatomic variants: None significant Review of the MIP images confirms the above findings IMPRESSION: No emergent finding or explanation for symptoms. Electronically Signed   By: Jorje Guild M.D.   On: 11/29/2021 08:47    EKG: Independently reviewed. SR 65bpm.  Assessment/Plan Principal Problem:   TIA (transient ischemic attack)    Generalized weakness and speech difficulty suspicious of TIA -CT head with no acute findings -Plan to obtain brain MRI, 2D echocardiogram, lipid panel, A1c, and TSH -Continue aspirin and Plavix -Hold statin for now and check LDL levels -Appreciate neurology evaluation -PT/OT/ST  Mild transaminitis -Hold statin for now and check CK levels -Continue to monitor  History of hypertension -Hold antihypertensives for permissive hypertension -As needed IV pushes for blood pressure 220/110 or greater  Type 2 diabetes -SSI -And hold Janumet -Carb modified diet  Dyslipidemia/CAD -Check lipid panel, hold statin for now -Continue antiplatelets   DVT prophylaxis: Lovenox Code Status: Full Family Communication: None at bedside/pt lives alone Disposition Plan:Admit for TIA/CVA workup Consults called:Neurology Admission status: Obs, Tele  Willys Salvino D Dennis Hegeman DO [facility]  If 7PM-7AM, please contact night-coverage [email or web address]  11/29/2021, 9:23 AM

## 2021-11-29 NOTE — Progress Notes (Signed)
Inpatient Diabetes Program Recommendations  AACE/ADA: New Consensus Statement on Inpatient Glycemic Control (2015)  Target Ranges:  Prepandial:   less than 140 mg/dL      Peak postprandial:   less than 180 mg/dL (1-2 hours)      Critically ill patients:  140 - 180 mg/dL   Lab Results  Component Value Date   GLUCAP 154 (H) 11/29/2021   HGBA1C (H) 09/12/2007    6.3 (NOTE)  Therapeutic target for the treatment of diabetes mellitus patients is <7% HbA1c.  American Diabetes Association Diabetes Care 2002;25:S33-S49.    Review of Glycemic Control  Latest Reference Range & Units 11/29/21 06:20 11/29/21 06:45 11/29/21 07:38  Glucose-Capillary 70 - 99 mg/dL 61 (L) 53 (L) 154 (H)  (L): Data is abnormally low (H): Data is abnormally high  Diabetes history: DM2 Outpatient Diabetes medications: Janumet [phone] mg QD, Glipizide 10 mg QD Current orders for Inpatient glycemic control: None  Spoke with patient over the phone.  She states she could not move this morning and when she got the the hospital her CBG was 61 mg/dl. Her PCP increased her Glipizide from 5 mg to 10 mg QD.  She has been having more frequent episodes of hypoglycemia since then.  She checks her blood sugar daily and her fasting's are running 90-100 mg/dl.    Please consider either discontinuing Glipizide or decreasing the dose at discharge.  Needs to follow up with PCP.   Will continue to follow while inpatient.  Thank you, Reche Dixon, RN, BSN Diabetes Coordinator Inpatient Diabetes Program [phone] (team pager from 8a-5p)

## 2021-11-29 NOTE — ED Provider Notes (Signed)
[place] Provider Note   CSN: [id number] Arrival date & time: 11/29/21  0541     History Chief Complaint  Patient presents with   Weakness    [name] is a 75 y.o. female.  The history is provided by the patient.  Weakness Severity:  Moderate Onset quality:  Sudden Timing:  Constant Progression:  Improving Chronicity:  New Relieved by:  None tried Worsened by:  Nothing Associated symptoms: no abdominal pain, no aphasia, no chest pain, no dizziness, no dysuria, no numbness in extremities, no fever, no headaches, no vision change and no vomiting   Risk factors: coronary artery disease and diabetes   Patient with history of CAD, diabetes, hypertension presents with weakness and slurred speech Patient reports she went to bed around 11:30 PM on December 28 She was at her baseline without any difficulties and has been feeling well. She woke up this morning and reports that she could not move either arm or either leg.  She also reports she feels like her speech is different. She reports her weakness is improving.  No visual changes.  No chest pain or shortness of breath.  No headache.  No recent trauma.  No new neck or back pain.  She reports she may have started some new diabetic medication Denies previous history of stroke    Past Medical History:  Diagnosis Date   Arthritis    CAD (coronary artery disease)    Cancer ([place])    Diabetes mellitus ([place])    type 2 x  1 or 2 yrs   Fatty liver    GERD (gastroesophageal reflux disease)    High cholesterol    Hypertension    Seasonal allergies     Patient Active Problem List   Diagnosis Date Noted   Renal mass 03/27/2020   Abnormal abdominal CT scan 01/10/2020   Elevated liver enzymes 05/13/2013   IBS (irritable bowel syndrome) 10/22/2012   Right lower quadrant abdominal pain 05/27/2012   Hypertension 05/27/2012   High cholesterol 05/27/2012   CAD (coronary artery disease) 05/27/2012     Past Surgical History:  Procedure Laterality Date   CARDIAC CATHETERIZATION  09/11/2007   100% occlusion of the left circumflex. proceed with stenting   CARDIAC CATHETERIZATION  09/11/2007   mid circumflex occlusion with tandem 80% proximal LAD lesion to the 50% mid LAD lesion. nonDES stenting of the circumflex occlusion after recanalization and balloon dilatation, the stent was a 2.75x67mm Liberte, TIMI3 flow was restored   CARDIOVASCULAR STRESS TEST  03/24/2012   normal pattern of perfusion in all regions, no scintigraphic evidence of inducible myocardial ischemia   CHOLECYSTECTOMY  2008   COLONOSCOPY  06/17/2012   Procedure: COLONOSCOPY;  Surgeon: Rogene Houston, MD;  Location: [facility];  Service: Endoscopy;  Laterality: N/A;  100   COLONOSCOPY WITH PROPOFOL N/A 01/14/2020   Procedure: COLONOSCOPY WITH PROPOFOL;  Surgeon: Rogene Houston, MD;  Location: [facility];  Service: Endoscopy;  Laterality: N/A;  [place]  07/20/2012   EF 60-65%, wall motion normal, there were no regional wall motion abnormalities   Left breast lumpectomy     LOWER EXTREMITY VENOUS DOPPLER Left 12/27/2011   no evidence of deep vein thrombosis involving the left lower extremity and right common femoral vein, enlarged inguinal lymph node is visualized on the left. no baker's cyst on the left.   PARTIAL HYSTERECTOMY       OB History  No obstetric history on file.     Family History  Problem Relation Age of Onset   Heart disease Mother    Cancer Mother        Breast cancer    Social History   Tobacco Use   Smoking status: Never   Smokeless tobacco: Never  Vaping Use   Vaping Use: Never used  Substance Use Topics   Alcohol use: No   Drug use: No    Home Medications Prior to Admission medications   Medication Sig Start Date End Date Taking? Authorizing Provider  amLODipine (NORVASC) 5 MG tablet Take 1 tablet (5 mg total) by mouth daily. 10/27/19 07/24/21   Herminio Commons, MD  aspirin EC 81 MG tablet Take 81 mg by mouth every morning.    [provider]  Cholecalciferol (VITAMIN D) 125 MCG (5000 UT) CAPS Take by mouth daily.     [provider]  clopidogrel (PLAVIX) 75 MG tablet Take 1 tablet (75 mg total) by mouth every morning. 11/18/13   Pixie Casino, MD  diazepam (VALIUM) 10 MG tablet Take 10 mg by mouth once. Patient not taking: Reported on 07/24/2021 04/10/20   [provider]  ibuprofen (ADVIL) 600 MG tablet Take 600 mg by mouth 3 (three) times daily. 02/18/20   [provider]  lisinopril (PRINIVIL,ZESTRIL) 20 MG tablet Take 20 mg by mouth 2 (two) times daily.  06/19/17   [provider]  nitroGLYCERIN (NITROSTAT) 0.4 MG SL tablet Place 1 tablet (0.4 mg total) under the tongue every 5 (five) minutes as needed for chest pain. 07/15/17 07/24/21  Herminio Commons, MD  Polyethyl Glycol-Propyl Glycol (SYSTANE OP) Apply 1 drop to eye 2 (two) times daily.     [provider]  rosuvastatin (CRESTOR) 10 MG tablet Take 10 mg by mouth daily.  05/22/17   [provider]  triamcinolone (KENALOG) 0.025 % ointment Apply 1 application topically 2 (two) times daily. 01/15/21   Scot Jun, FNP    Allergies    Patient has no known allergies.  Review of Systems   Review of Systems  Constitutional:  Negative for fever.  Eyes:  Negative for visual disturbance.  Cardiovascular:  Negative for chest pain.  Gastrointestinal:  Negative for abdominal pain and vomiting.  Genitourinary:  Negative for dysuria.  Musculoskeletal:  Positive for back pain. Negative for neck pain.       Chronic back pain  Neurological:  Positive for speech difficulty and weakness. Negative for dizziness, numbness and headaches.  All other systems reviewed and are negative.  Physical Exam Updated Vital Signs BP (!) 199/81    Pulse 74    Temp 98.2 F (36.8 C) (Oral)    Resp 17    Ht 1.537 m (5' 0.5")    Wt  79.8 kg    SpO2 97%    BMI 33.81 kg/m   Physical Exam CONSTITUTIONAL: Well developed/well nourished HEAD: Normocephalic/atraumatic EYES: EOMI/PERRL, no nystagmus, no ptosis ENMT: Mucous membranes moist NECK: supple no meningeal signs, no bruits CV: S1/S2 noted, no murmurs/rubs/gallops noted LUNGS: Lungs are clear to auscultation bilaterally, no apparent distress ABDOMEN: soft, nontender, no rebound or guarding GU:no cva tenderness NEURO:Awake/alert, face symmetric, no arm or leg drift is noted Equal 5/5 strength with shoulder abduction, elbow flex/extension, wrist flex/extension in upper extremities and equal hand grips bilaterally Cranial nerves 3/4/5/6/06/09/09/11/12 tested and intact Sensation to light touch intact in all extremities ?  Mild dysarthria noted EXTREMITIES: pulses normal,  full ROM SKIN: warm, color normal PSYCH: no abnormalities of mood noted  ED Results / Procedures / Treatments   Labs (all labs ordered are listed, but only abnormal results are displayed) Labs Reviewed  CBG MONITORING, ED - Abnormal; Notable for the following components:      Result Value   Glucose-Capillary 61 (*)    All other components within normal limits  CBG MONITORING, ED - Abnormal; Notable for the following components:   Glucose-Capillary 53 (*)    All other components within normal limits  RESP PANEL BY RT-PCR (FLU A&B, COVID) ARPGX2  CBC  DIFFERENTIAL  ETHANOL  PROTIME-INR  APTT  COMPREHENSIVE METABOLIC PANEL  RAPID URINE DRUG SCREEN, HOSP PERFORMED  URINALYSIS, ROUTINE W REFLEX MICROSCOPIC    EKG EKG Interpretation  Date/Time:  Thursday November 29 2021 05:53:20 EST Ventricular Rate:  68 PR Interval:  134 QRS Duration: 91 QT Interval:  400 QTC Calculation: 426 R Axis:   -3 Text Interpretation: Sinus rhythm Borderline T abnormalities, diffuse leads Confirmed by Ripley Fraise [phone]) on 11/29/2021 6:00:02 AM  Radiology No results found.  Procedures Procedures    Medications Ordered in ED Medications  dextrose 50 % solution (has no administration in time range)  dextrose 50 % solution 50 mL (50 mLs Intravenous Given 11/29/21 0650)    ED Course  I have reviewed the triage vital signs and the nursing notes.  Pertinent labs & imaging results that were available during my care of the patient were reviewed by me and considered in my medical decision making (see chart for details).    MDM Rules/Calculators/A&P                         6:08 AM Last known well was at 2330 on December 28. Patient reports waking up with these symptoms and that they are improving No arm or leg drift. She does have mild spurred speech Unclear cause of symptoms.  CT head is pending. I have consulted teleneurology 6:26 AM EMS reports that her glucose was 280, glucose here was 61. This could be contributing to her symptoms Teleneuro in process 7:03 AM D/w Dr. Posey Pronto with teleneuro Recommends CT angio, then admit/MRI/stroke workup Repeat glucose was 53, D50 given Signed out to Dr Karle Starch  at shift change  This patient presents to the ED for concern of weakness and slurred speech, this involves an extensive number of treatment options, and is a complaint that carries with it a high risk of complications and morbidity.  The differential diagnosis includes CVA, metabolic derangement, UTI, intracranial hemorrhage, adverse medication reaction     Lab Tests:  I Ordered, reviewed, and interpreted labs.  The pertinent results include:  hypoglycemia   Imaging Studies ordered:  I ordered imaging studies including CT head  I independently visualized and interpreted imaging which showed no acute findings    Cardiac Monitoring:  The patient was maintained on a cardiac monitor.  I personally viewed and interpreted the cardiac monitored which showed an underlying rhythm of: sinus rhythm   Medicines ordered and prescription drug management:  I ordered medication  including D50  for hypoglycemia  Reevaluation of the patient after these medicines showed that the patient improved I have reviewed the patients home medicines and have made adjustments as needed    Consultations Obtained:  I requested consultation with the teleneuro Dr. Posey Pronto,  and discussed lab and imaging findings as well as pertinent plan - they recommend: CT angio  head/neck, and then MRI/stroke workup   Reevaluation:  After the interventions noted above, I reevaluated the patient and found that they have :improved   Dispostion:  After consideration of the diagnostic results and the patients response to treatment feel that the patent would benefit from admission.     Final Clinical Impression(s) / ED Diagnoses Final diagnoses:  Weakness  Hypoglycemia    Rx / DC Orders ED Discharge Orders     None        Ripley Fraise, MD 11/29/21 [phone]

## 2021-11-30 DIAGNOSIS — I10 Essential (primary) hypertension: Secondary | ICD-10-CM | POA: Diagnosis not present

## 2021-11-30 DIAGNOSIS — E782 Mixed hyperlipidemia: Secondary | ICD-10-CM | POA: Diagnosis not present

## 2021-11-30 DIAGNOSIS — R479 Unspecified speech disturbances: Secondary | ICD-10-CM | POA: Diagnosis not present

## 2021-11-30 DIAGNOSIS — E11319 Type 2 diabetes mellitus with unspecified diabetic retinopathy without macular edema: Secondary | ICD-10-CM | POA: Diagnosis not present

## 2021-11-30 DIAGNOSIS — R531 Weakness: Secondary | ICD-10-CM | POA: Diagnosis not present

## 2021-11-30 LAB — COMPREHENSIVE METABOLIC PANEL
ALT: 58 U/L — ABNORMAL HIGH (ref 0–44)
AST: 88 U/L — ABNORMAL HIGH (ref 15–41)
Albumin: 3.6 g/dL (ref 3.5–5.0)
Alkaline Phosphatase: 71 U/L (ref 38–126)
Anion gap: 8 (ref 5–15)
BUN: 12 mg/dL (ref 8–23)
CO2: 24 mmol/L (ref 22–32)
Calcium: 8.6 mg/dL — ABNORMAL LOW (ref 8.9–10.3)
Chloride: 105 mmol/L (ref 98–111)
Creatinine, Ser: 0.74 mg/dL (ref 0.44–1.00)
GFR, Estimated: >60 mL/min (ref >60)
Glucose, Bld: 101 mg/dL — ABNORMAL HIGH (ref 70–99)
Potassium: 3.5 mmol/L (ref 3.5–5.1)
Sodium: 137 mmol/L (ref 135–145)
Total Bilirubin: 0.4 mg/dL (ref 0.3–1.2)
Total Protein: 6.5 g/dL (ref 6.5–8.1)

## 2021-11-30 LAB — CK: Total CK: 251 U/L — ABNORMAL HIGH (ref 38–234)

## 2021-11-30 LAB — LIPID PANEL
Cholesterol: 108 mg/dL (ref 0–200)
HDL: 38 mg/dL — ABNORMAL LOW (ref >40)
LDL Cholesterol: 59 mg/dL (ref 0–99)
Total CHOL/HDL Ratio: 2.8 RATIO
Triglycerides: 56 mg/dL (ref <150)
VLDL: 11 mg/dL (ref 0–40)

## 2021-11-30 LAB — HEMOGLOBIN A1C
Hgb A1c MFr Bld: 7.2 % — ABNORMAL HIGH (ref 4.8–5.6)
Mean Plasma Glucose: 159.94 mg/dL

## 2021-11-30 MED ORDER — AMLODIPINE BESYLATE 10 MG PO TABS: 10.0000 mg | ORAL_TABLET | Freq: Every day | ORAL | 2 refills | Status: DC

## 2021-11-30 MED ORDER — AMLODIPINE BESYLATE 5 MG PO TABS: 10.0000 mg | ORAL_TABLET | Freq: Every day | ORAL | Status: DC

## 2021-11-30 NOTE — Progress Notes (Signed)
Pt discharged via WC to POV with personal belongings in her possession.

## 2021-11-30 NOTE — Care Management Obs Status (Signed)
[facility] NOTIFICATION   Patient Details  Name: [name] MRN: [id number] Date of Birth: 1946/11/21   Medicare Observation Status Notification Given:  Yes    Iona Beard, [place] 11/30/2021, 9:09 AM

## 2021-11-30 NOTE — Progress Notes (Signed)
OT Cancellation Note  Patient Details Name: [name] MRN: [id number] DOB: 18-Dec-1945   Cancelled Treatment:    Reason Eval/Treat Not Completed: OT screened, no needs identified, will sign off. Pt screened for OT needs. MRI and CT negative for acute infarct, pt perform ADLs and mobility at mod independent level. No further OT needs at this time.    Guadelupe Sabin, OTR/L  [phone] 11/30/2021, 7:09 AM

## 2021-11-30 NOTE — Discharge Summary (Signed)
Physician Discharge Summary  [name] NKN:[id number] DOB: [birth year] DOA: 11/29/2021  PCP: [name], MD  Admit date: 11/29/2021  Discharge date: 11/30/2021  Admitted From:Home  Disposition:  Home  Recommendations for Outpatient Follow-up:  Follow up with PCP in 1-2 weeks Increase amlodipine to 10 mg as prescribed and discontinue glipizide Monitor blood pressures at home carefully along with blood glucose readings Continue other home medications as prior  Home Health: None  Equipment/Devices: None  Discharge Condition:Stable  CODE STATUS: Full  Diet recommendation: Heart Healthy/carb modified  Brief/Interim Summary: [name] is a 75 y.o. female with medical history significant for hypertension, dyslipidemia, type 2 diabetes, GERD, CAD, uterine cancer, and arthritis who presented to the ED with complaints of generalized weakness and some speech difficulty.  Patient was admitted with suspected TIA, but appears to have had transient encephalopathy due to hypertensive urgency versus possibly some hypoglycemia.  Her brain MRI did not demonstrate any acute findings.  She is to remain on aspirin and Plavix daily as prescribed and continue rosuvastatin 10 mg daily for stroke prevention.  2D echocardiogram without any acute findings.  She was seen by neurology during this admission, please see note for full details.  No further acute events during the inpatient stay.  Discharge Diagnoses:  Principal Problem:   TIA (transient ischemic attack)  Principal discharge diagnosis: Transient encephalopathy secondary to hypertensive urgency and possibly some hypoglycemia.  Less likely TIA.  Discharge Instructions  Discharge Instructions     Diet - low sodium heart healthy   Complete by: As directed    Increase activity slowly   Complete by: As directed       Allergies as of 11/30/2021   No Known Allergies      Medication List     STOP taking these medications     glipiZIDE 10 MG 24 hr tablet Commonly known as: GLUCOTROL XL       TAKE these medications    amLODipine 10 MG tablet Commonly known as: NORVASC Take 1 tablet (10 mg total) by mouth daily. Start taking on: December 01, 2021 What changed:  medication strength how much to take   aspirin EC 81 MG tablet Take 81 mg by mouth every morning.   clopidogrel 75 MG tablet Commonly known as: PLAVIX Take 1 tablet (75 mg total) by mouth every morning.   Janumet XR [phone] MG Tb24 Generic drug: SitaGLIPtin-MetFORMIN HCl Take 1 tablet by mouth daily.   lisinopril 20 MG tablet Commonly known as: ZESTRIL Take 20 mg by mouth daily.   nitroGLYCERIN 0.4 MG SL tablet Commonly known as: NITROSTAT Place 1 tablet (0.4 mg total) under the tongue every 5 (five) minutes as needed for chest pain.   rosuvastatin 10 MG tablet Commonly known as: CRESTOR Take 10 mg by mouth daily.   SYSTANE OP Apply 1 drop to eye 2 (two) times daily.   triamcinolone 0.025 % ointment Commonly known as: KENALOG Apply 1 application topically 2 (two) times daily.   Vitamin D 125 MCG (5000 UT) Caps Take 1 tablet by mouth daily.        Follow-up Information     Redmond School, MD. Schedule an appointment as soon as possible for a visit.   Specialty: Internal Medicine Contact information: [address] [place] [address] [phone]                No Known Allergies  Consultations: Neurology   Procedures/Studies: CT Angio Head W or Wo Contrast  Result Date: 11/29/2021 CLINICAL DATA:  Stroke follow-up. Frozen feeling earlier today, now resolved. EXAM: CT ANGIOGRAPHY HEAD AND NECK TECHNIQUE: Multidetector CT imaging of the head and neck was performed using the standard protocol during bolus administration of intravenous contrast. Multiplanar CT image reconstructions and MIPs were obtained to evaluate the vascular anatomy. Carotid stenosis measurements (when applicable) are  obtained utilizing NASCET criteria, using the distal internal carotid diameter as the denominator. CONTRAST:  46mL OMNIPAQUE IOHEXOL 350 MG/ML SOLN COMPARISON:  Head CT earlier today FINDINGS: CTA NECK FINDINGS Aortic arch: Normal arch.  Two vessel branching. Right carotid system: No evidence of dissection, stenosis (50% or greater) or occlusion. Left carotid system: No evidence of dissection, stenosis (50% or greater) or occlusion. Vertebral arteries: No proximal subclavian stenosis. Hypoplastic left vertebral artery which is fenestrated at the V3 segment. Skeleton: Generalized cervical spine degeneration. Other neck: No evidence of mass or inflammation. Small thyroid nodules measuring up to 9 mm on the left. No followup recommended (ref: J Am Coll Radiol. 2015 Feb;12(2): 143-50). Upper chest: Negative Review of the MIP images confirms the above findings CTA HEAD FINDINGS Anterior circulation: Vessels are smooth and widely patent. Negative for aneurysm. No significant atheromatous changes, especially for age. Posterior circulation: Right dominant vertebral artery with only trace contribution to the basilar from the left. No branch occlusion, beading, or aneurysm. Venous sinuses: Diffusely patent Anatomic variants: None significant Review of the MIP images confirms the above findings IMPRESSION: No emergent finding or explanation for symptoms. Electronically Signed   By: Jorje Guild M.D.   On: 11/29/2021 08:47   CT HEAD WO CONTRAST  Result Date: 11/29/2021 CLINICAL DATA:  Neuro deficit with acute stroke suspected EXAM: CT HEAD WITHOUT CONTRAST TECHNIQUE: Contiguous axial images were obtained from the base of the skull through the vertex without intravenous contrast. COMPARISON:  None. FINDINGS: Brain: No evidence of acute infarction, hemorrhage, hydrocephalus, extra-axial collection or mass lesion/mass effect. Patchy white matter low-density consistent with chronic small vessel ischemia. Central predominant  cerebral volume loss Vascular: No hyperdense vessel or unexpected calcification. Skull: Normal. Negative for fracture or focal lesion. Sinuses/Orbits: Negative IMPRESSION: Aging brain without acute or focal finding. Electronically Signed   By: Jorje Guild M.D.   On: 11/29/2021 07:15   CT Angio Neck W and/or Wo Contrast  Result Date: 11/29/2021 CLINICAL DATA:  Stroke follow-up. Frozen feeling earlier today, now resolved. EXAM: CT ANGIOGRAPHY HEAD AND NECK TECHNIQUE: Multidetector CT imaging of the head and neck was performed using the standard protocol during bolus administration of intravenous contrast. Multiplanar CT image reconstructions and MIPs were obtained to evaluate the vascular anatomy. Carotid stenosis measurements (when applicable) are obtained utilizing NASCET criteria, using the distal internal carotid diameter as the denominator. CONTRAST:  67mL OMNIPAQUE IOHEXOL 350 MG/ML SOLN COMPARISON:  Head CT earlier today FINDINGS: CTA NECK FINDINGS Aortic arch: Normal arch.  Two vessel branching. Right carotid system: No evidence of dissection, stenosis (50% or greater) or occlusion. Left carotid system: No evidence of dissection, stenosis (50% or greater) or occlusion. Vertebral arteries: No proximal subclavian stenosis. Hypoplastic left vertebral artery which is fenestrated at the V3 segment. Skeleton: Generalized cervical spine degeneration. Other neck: No evidence of mass or inflammation. Small thyroid nodules measuring up to 9 mm on the left. No followup recommended (ref: J Am Coll Radiol. 2015 Feb;12(2): 143-50). Upper chest: Negative Review of the MIP images confirms the above findings CTA HEAD FINDINGS Anterior circulation: Vessels are smooth and widely patent. Negative for aneurysm.  No significant atheromatous changes, especially for age. Posterior circulation: Right dominant vertebral artery with only trace contribution to the basilar from the left. No branch occlusion, beading, or aneurysm.  Venous sinuses: Diffusely patent Anatomic variants: None significant Review of the MIP images confirms the above findings IMPRESSION: No emergent finding or explanation for symptoms. Electronically Signed   By: Jorje Guild M.D.   On: 11/29/2021 08:47   MR BRAIN WO CONTRAST  Result Date: 11/29/2021 CLINICAL DATA:  Transient ischemic attack (TIA) EXAM: MRI HEAD WITHOUT CONTRAST TECHNIQUE: Multiplanar, multiecho pulse sequences of the brain and surrounding structures were obtained without intravenous contrast. COMPARISON:  Same day CT. FINDINGS: Brain: No acute infarction, hemorrhage, hydrocephalus, extra-axial collection or mass lesion. Moderate patchy T2/FLAIR hyperintensities white matter, nonspecific but compatible with chronic microvascular ischemic disease. Generalized atrophy. Vascular: Major arterial flow voids are maintained skull base. Skull and upper cervical spine: Normal marrow signal. Sinuses/Orbits: Clear sinuses.  Unremarkable orbits. Other: No mastoid effusions. IMPRESSION: 1. No evidence of acute intracranial abnormality. 2. Moderate chronic microvascular ischemic disease and atrophy. Electronically Signed   By: Margaretha Sheffield M.D.   On: 11/29/2021 10:49   ECHOCARDIOGRAM COMPLETE  Result Date: 11/29/2021    ECHOCARDIOGRAM REPORT   Patient Name:   [name] Date of Exam: 11/29/2021 Medical Rec #:  [id number]     Height:       60.5 in Accession #:    [id number]    Weight:       176.0 lb Date of Birth:  [birth year]      BSA:          1.779 m Patient Age:    64 years      BP:           206/78 mmHg Patient Gender: F             HR:           65 bpm. Exam Location:  [facility] Procedure: 2D Echo, Cardiac Doppler and Color Doppler Indications:    Stroke  History:        Patient has prior history of Echocardiogram examinations, most                 recent 07/10/2012. CAD and Previous Myocardial Infarction, TIA;                 Risk Factors:Hypertension and Dyslipidemia.  Sonographer:     Wenda Low Referring Phys: [id number] [facility]  Sonographer Comments: FLU + IMPRESSIONS  1. Left ventricular ejection fraction, by estimation, is 60 to 65%. The left ventricle has normal function. The left ventricle has no regional wall motion abnormalities. There is mild left ventricular hypertrophy. Left ventricular diastolic parameters were normal.  2. Right ventricular systolic function is normal. The right ventricular size is normal. There is normal pulmonary artery systolic pressure.  3. Left atrial size was mildly dilated.  4. The mitral valve is normal in structure. No evidence of mitral valve regurgitation. No evidence of mitral stenosis.  5. The aortic valve is tricuspid. Aortic valve regurgitation is not visualized. No aortic stenosis is present.  6. The inferior vena cava is normal in size with greater than 50% respiratory variability, suggesting right atrial pressure of 3 mmHg. FINDINGS  Left Ventricle: Left ventricular ejection fraction, by estimation, is 60 to 65%. The left ventricle has normal function. The left ventricle has no regional wall motion abnormalities. The left ventricular internal cavity size was normal in  size. There is  mild left ventricular hypertrophy. Left ventricular diastolic parameters were normal. Right Ventricle: The right ventricular size is normal. No increase in right ventricular wall thickness. Right ventricular systolic function is normal. There is normal pulmonary artery systolic pressure. The tricuspid regurgitant velocity is 2.48 m/s, and  with an assumed right atrial pressure of 3 mmHg, the estimated right ventricular systolic pressure is 63.3 mmHg. Left Atrium: Left atrial size was mildly dilated. Right Atrium: Right atrial size was normal in size. Pericardium: There is no evidence of pericardial effusion. Mitral Valve: The mitral valve is normal in structure. No evidence of mitral valve regurgitation. No evidence of mitral valve stenosis. MV peak gradient,  4.3 mmHg. The mean mitral valve gradient is 1.0 mmHg. Tricuspid Valve: The tricuspid valve is normal in structure. Tricuspid valve regurgitation is trivial. No evidence of tricuspid stenosis. Aortic Valve: The aortic valve is tricuspid. Aortic valve regurgitation is not visualized. No aortic stenosis is present. Aortic valve mean gradient measures 3.5 mmHg. Aortic valve peak gradient measures 6.0 mmHg. Aortic valve area, by VTI measures 3.50 cm. Pulmonic Valve: The pulmonic valve was normal in structure. Pulmonic valve regurgitation is not visualized. No evidence of pulmonic stenosis. Aorta: The aortic root is normal in size and structure. Venous: The inferior vena cava is normal in size with greater than 50% respiratory variability, suggesting right atrial pressure of 3 mmHg. IAS/Shunts: No atrial level shunt detected by color flow Doppler.  LEFT VENTRICLE PLAX 2D LVIDd:         4.50 cm     Diastology LVIDs:         2.20 cm     LV e' medial:    5.22 cm/s LV PW:         1.20 cm     LV E/e' medial:  13.8 LV IVS:        1.20 cm     LV e' lateral:   5.98 cm/s LVOT diam:     1.90 cm     LV E/e' lateral: 12.0 LV SV:         114 LV SV Index:   64 LVOT Area:     2.84 cm  LV Volumes (MOD) LV vol d, MOD A2C: 41.4 ml LV vol d, MOD A4C: 49.3 ml LV vol s, MOD A2C: 16.4 ml LV vol s, MOD A4C: 18.8 ml LV SV MOD A2C:     25.0 ml LV SV MOD A4C:     49.3 ml LV SV MOD BP:      29.4 ml RIGHT VENTRICLE RV Basal diam:  2.70 cm RV Mid diam:    2.40 cm RV S prime:     20.30 cm/s TAPSE (M-mode): 2.2 cm LEFT ATRIUM             Index        RIGHT ATRIUM           Index LA diam:        4.20 cm 2.36 cm/m   RA Area:     11.10 cm LA Vol (A2C):   43.5 ml 24.45 ml/m  RA Volume:   22.00 ml  12.37 ml/m LA Vol (A4C):   46.8 ml 26.31 ml/m LA Biplane Vol: 49.4 ml 27.77 ml/m  AORTIC VALVE                     PULMONIC VALVE AV Area (Vmax):    3.88 cm  PR End Diast Vel: 0.96 msec AV Area (Vmean):   3.36 cm AV Area (VTI):     3.50 cm AV Vmax:            122.00 cm/s AV Vmean:          90.400 cm/s AV VTI:            0.325 m AV Peak Grad:      6.0 mmHg AV Mean Grad:      3.5 mmHg LVOT Vmax:         167.00 cm/s LVOT Vmean:        107.000 cm/s LVOT VTI:          0.401 m LVOT/AV VTI ratio: 1.23  AORTA Ao Root diam: 2.70 cm Ao Asc diam:  3.00 cm MITRAL VALVE               TRICUSPID VALVE MV Area (PHT): 2.66 cm    TR Peak grad:   24.6 mmHg MV Area VTI:   4.38 cm    TR Vmax:        248.00 cm/s MV Peak grad:  4.3 mmHg MV Mean grad:  1.0 mmHg    SHUNTS MV Vmax:       1.04 m/s    Systemic VTI:  0.40 m MV Vmean:      51.7 cm/s   Systemic Diam: 1.90 cm MV Decel Time: 285 msec MV E velocity: 72.00 cm/s MV A velocity: 97.30 cm/s MV E/A ratio:  0.74 Jenkins Rouge MD Electronically signed by Jenkins Rouge MD Signature Date/Time: 11/29/2021/4:32:04 PM    Final      Discharge Exam: Vitals:   11/30/21 0608 11/30/21 0900  BP: (!) 166/59 (!) 166/62  Pulse: (!) 57 67  Resp: 19 16  Temp: 98.7 F (37.1 C)   SpO2: 98% 99%   Vitals:   11/30/21 0245 11/30/21 0432 11/30/21 0608 11/30/21 0900  BP: (!) 179/60 (!) 164/81 (!) 166/59 (!) 166/62  Pulse: (!) 52 67 (!) 57 67  Resp: 15 18 19 16   Temp: 99 F (37.2 C) 99.6 F (37.6 C) 98.7 F (37.1 C)   TempSrc: Oral Oral Oral   SpO2: 100% 95% 98% 99%  Weight:      Height:        General: Pt is alert, awake, not in acute distress Cardiovascular: RRR, S1/S2 +, no rubs, no gallops Respiratory: CTA bilaterally, no wheezing, no rhonchi Abdominal: Soft, NT, ND, bowel sounds + Extremities: no edema, no cyanosis    The results of significant diagnostics from this hospitalization (including imaging, microbiology, ancillary and laboratory) are listed below for reference.     Microbiology: Recent Results (from the past 240 hour(s))  Resp Panel by RT-PCR (Flu A&B, Covid) Nasopharyngeal Swab     Status: Abnormal   Collection Time: 11/29/21  6:01 AM   Specimen: Nasopharyngeal Swab; Nasopharyngeal(NP) swabs in vial  transport medium  Result Value Ref Range Status   SARS Coronavirus 2 by RT PCR NEGATIVE NEGATIVE Final    Comment: (NOTE) SARS-CoV-2 target nucleic acids are NOT DETECTED.  The SARS-CoV-2 RNA is generally detectable in upper respiratory specimens during the acute phase of infection. The lowest concentration of SARS-CoV-2 viral copies this assay can detect is 138 copies/mL. A negative result does not preclude SARS-Cov-2 infection and should not be used as the sole basis for treatment or other patient management decisions. A negative result may occur with  improper specimen collection/handling, submission of specimen  other than nasopharyngeal swab, presence of viral mutation(s) within the areas targeted by this assay, and inadequate number of viral copies(<138 copies/mL). A negative result must be combined with clinical observations, patient history, and epidemiological information. The expected result is Negative.  Fact Sheet for Patients:  EntrepreneurPulse.com.au  Fact Sheet for Healthcare Providers:  IncredibleEmployment.be  This test is no t yet approved or cleared by the [place] FDA and  has been authorized for detection and/or diagnosis of SARS-CoV-2 by FDA under an Emergency Use Authorization (EUA). This EUA will remain  in effect (meaning this test can be used) for the duration of the COVID-19 declaration under Section 564(b)(1) of the Act, 21 U.S.C.section 360bbb-3(b)(1), unless the authorization is terminated  or revoked sooner.       Influenza A by PCR POSITIVE (A) NEGATIVE Final   Influenza B by PCR NEGATIVE NEGATIVE Final    Comment: (NOTE) The Xpert Xpress SARS-CoV-2/FLU/RSV plus assay is intended as an aid in the diagnosis of influenza from Nasopharyngeal swab specimens and should not be used as a sole basis for treatment. Nasal washings and aspirates are unacceptable for Xpert Xpress SARS-CoV-2/FLU/RSV testing.  Fact  Sheet for Patients: EntrepreneurPulse.com.au  Fact Sheet for Healthcare Providers: IncredibleEmployment.be  This test is not yet approved or cleared by the [place] FDA and has been authorized for detection and/or diagnosis of SARS-CoV-2 by FDA under an Emergency Use Authorization (EUA). This EUA will remain in effect (meaning this test can be used) for the duration of the COVID-19 declaration under Section 564(b)(1) of the Act, 21 U.S.C. section 360bbb-3(b)(1), unless the authorization is terminated or revoked.  Performed at [facility], [address], [place], [place] [address]      Labs: BNP (last 3 results) No results for input(s): BNP in the last 8760 hours. Basic Metabolic Panel: Recent Labs  Lab 11/29/21 0611 11/30/21 0430  NA 139 137  K 3.5 3.5  CL 106 105  CO2 24 24  GLUCOSE 57* 101*  BUN 12 12  CREATININE 0.80 0.74  CALCIUM 9.1 8.6*   Liver Function Tests: Recent Labs  Lab 11/29/21 0611 11/30/21 0430  AST 120* 88*  ALT 66* 58*  ALKPHOS 83 71  BILITOT 0.4 0.4  PROT 7.6 6.5  ALBUMIN 4.2 3.6   No results for input(s): LIPASE, AMYLASE in the last 168 hours. No results for input(s): AMMONIA in the last 168 hours. CBC: Recent Labs  Lab 11/29/21 0611  WBC 5.3  NEUTROABS 3.8  HGB 12.8  HCT 39.7  MCV 91.5  PLT 170   Cardiac Enzymes: Recent Labs  Lab 11/30/21 0430  CKTOTAL 251*   BNP: Invalid input(s): POCBNP CBG: Recent Labs  Lab 11/29/21 0620 11/29/21 0645 11/29/21 0738  GLUCAP 61* 53* 154*   D-Dimer No results for input(s): DDIMER in the last 72 hours. Hgb A1c Recent Labs    11/30/21 0430  HGBA1C 7.2*   Lipid Profile Recent Labs    11/30/21 0430  CHOL 108  HDL 38*  LDLCALC 59  TRIG 56  CHOLHDL 2.8   Thyroid function studies Recent Labs    11/29/21 0611  TSH 0.528   Anemia work up No results for input(s): VITAMINB12, FOLATE, FERRITIN, TIBC, IRON, RETICCTPCT in the last 72  hours. Urinalysis    Component Value Date/Time   COLORURINE YELLOW (A) 11/29/2021 1152   APPEARANCEUR HAZY (A) 11/29/2021 1152   LABSPEC 1.010 11/29/2021 1152   PHURINE 6.0 11/29/2021 1152   GLUCOSEU NEGATIVE 11/29/2021  [place] 11/29/2021 [place] 11/29/2021 1152   BILIRUBINUR neg 03/27/2020 0841   KETONESUR NEGATIVE 11/29/2021 1152   PROTEINUR NEGATIVE 11/29/2021 1152   UROBILINOGEN 0.2 03/27/2020 0841   NITRITE NEGATIVE 11/29/2021 1152   LEUKOCYTESUR NEGATIVE 11/29/2021 1152   Sepsis Labs Invalid input(s): PROCALCITONIN,  WBC,  LACTICIDVEN Microbiology Recent Results (from the past 240 hour(s))  Resp Panel by RT-PCR (Flu A&B, Covid) Nasopharyngeal Swab     Status: Abnormal   Collection Time: 11/29/21  6:01 AM   Specimen: Nasopharyngeal Swab; Nasopharyngeal(NP) swabs in vial transport medium  Result Value Ref Range Status   SARS Coronavirus 2 by RT PCR NEGATIVE NEGATIVE Final    Comment: (NOTE) SARS-CoV-2 target nucleic acids are NOT DETECTED.  The SARS-CoV-2 RNA is generally detectable in upper respiratory specimens during the acute phase of infection. The lowest concentration of SARS-CoV-2 viral copies this assay can detect is 138 copies/mL. A negative result does not preclude SARS-Cov-2 infection and should not be used as the sole basis for treatment or other patient management decisions. A negative result may occur with  improper specimen collection/handling, submission of specimen other than nasopharyngeal swab, presence of viral mutation(s) within the areas targeted by this assay, and inadequate number of viral copies(<138 copies/mL). A negative result must be combined with clinical observations, patient history, and epidemiological information. The expected result is Negative.  Fact Sheet for Patients:  EntrepreneurPulse.com.au  Fact Sheet for Healthcare Providers:   IncredibleEmployment.be  This test is no t yet approved or cleared by the [place] FDA and  has been authorized for detection and/or diagnosis of SARS-CoV-2 by FDA under an Emergency Use Authorization (EUA). This EUA will remain  in effect (meaning this test can be used) for the duration of the COVID-19 declaration under Section 564(b)(1) of the Act, 21 U.S.C.section 360bbb-3(b)(1), unless the authorization is terminated  or revoked sooner.       Influenza A by PCR POSITIVE (A) NEGATIVE Final   Influenza B by PCR NEGATIVE NEGATIVE Final    Comment: (NOTE) The Xpert Xpress SARS-CoV-2/FLU/RSV plus assay is intended as an aid in the diagnosis of influenza from Nasopharyngeal swab specimens and should not be used as a sole basis for treatment. Nasal washings and aspirates are unacceptable for Xpert Xpress SARS-CoV-2/FLU/RSV testing.  Fact Sheet for Patients: EntrepreneurPulse.com.au  Fact Sheet for Healthcare Providers: IncredibleEmployment.be  This test is not yet approved or cleared by the [place] FDA and has been authorized for detection and/or diagnosis of SARS-CoV-2 by FDA under an Emergency Use Authorization (EUA). This EUA will remain in effect (meaning this test can be used) for the duration of the COVID-19 declaration under Section 564(b)(1) of the Act, 21 U.S.C. section 360bbb-3(b)(1), unless the authorization is terminated or revoked.  Performed at [facility], [address] [place], [place] [address]      Time coordinating discharge: 35 minutes  SIGNED:   Rodena Goldmann, DO [facility] 11/30/2021, 10:24 AM  If 7PM-7AM, please contact night-coverage [email or web address]

## 2021-12-10 DIAGNOSIS — I10 Essential (primary) hypertension: Secondary | ICD-10-CM | POA: Diagnosis not present

## 2021-12-10 DIAGNOSIS — E11319 Type 2 diabetes mellitus with unspecified diabetic retinopathy without macular edema: Secondary | ICD-10-CM | POA: Diagnosis not present

## 2021-12-10 DIAGNOSIS — E669 Obesity, unspecified: Secondary | ICD-10-CM | POA: Diagnosis not present

## 2021-12-10 DIAGNOSIS — M549 Dorsalgia, unspecified: Secondary | ICD-10-CM | POA: Diagnosis not present

## 2021-12-10 DIAGNOSIS — Z6829 Body mass index (BMI) 29.0-29.9, adult: Secondary | ICD-10-CM | POA: Diagnosis not present

## 2021-12-10 DIAGNOSIS — M5431 Sciatica, right side: Secondary | ICD-10-CM | POA: Diagnosis not present

## 2021-12-18 ENCOUNTER — Other Ambulatory Visit (HOSPITAL_COMMUNITY): Payer: Self-pay | Admitting: Internal Medicine

## 2021-12-18 DIAGNOSIS — Z1231 Encounter for screening mammogram for malignant neoplasm of breast: Secondary | ICD-10-CM

## 2022-01-01 DIAGNOSIS — E11319 Type 2 diabetes mellitus with unspecified diabetic retinopathy without macular edema: Secondary | ICD-10-CM | POA: Diagnosis not present

## 2022-01-01 DIAGNOSIS — I10 Essential (primary) hypertension: Secondary | ICD-10-CM | POA: Diagnosis not present

## 2022-01-01 DIAGNOSIS — E7849 Other hyperlipidemia: Secondary | ICD-10-CM | POA: Diagnosis not present

## 2022-01-21 ENCOUNTER — Ambulatory Visit (HOSPITAL_COMMUNITY)
Admission: RE | Admit: 2022-01-21 | Discharge: 2022-01-21 | Disposition: A | Discharge status: Home / Self Care | Payer: Medicare HMO | Source: Ambulatory Visit | Attending: Internal Medicine | Admitting: Internal Medicine

## 2022-01-21 ENCOUNTER — Other Ambulatory Visit: Payer: Self-pay

## 2022-01-21 DIAGNOSIS — Z1231 Encounter for screening mammogram for malignant neoplasm of breast: Secondary | ICD-10-CM | POA: Diagnosis not present

## 2022-02-06 DIAGNOSIS — E119 Type 2 diabetes mellitus without complications: Secondary | ICD-10-CM | POA: Diagnosis not present

## 2022-03-01 DIAGNOSIS — E11319 Type 2 diabetes mellitus with unspecified diabetic retinopathy without macular edema: Secondary | ICD-10-CM | POA: Diagnosis not present

## 2022-03-01 DIAGNOSIS — I10 Essential (primary) hypertension: Secondary | ICD-10-CM | POA: Diagnosis not present

## 2022-03-01 DIAGNOSIS — E782 Mixed hyperlipidemia: Secondary | ICD-10-CM | POA: Diagnosis not present

## 2022-04-26 DIAGNOSIS — Z6830 Body mass index (BMI) 30.0-30.9, adult: Secondary | ICD-10-CM | POA: Diagnosis not present

## 2022-04-26 DIAGNOSIS — E6609 Other obesity due to excess calories: Secondary | ICD-10-CM | POA: Diagnosis not present

## 2022-04-26 DIAGNOSIS — E1165 Type 2 diabetes mellitus with hyperglycemia: Secondary | ICD-10-CM | POA: Diagnosis not present

## 2022-04-26 DIAGNOSIS — I10 Essential (primary) hypertension: Secondary | ICD-10-CM | POA: Diagnosis not present

## 2022-04-26 DIAGNOSIS — I2510 Atherosclerotic heart disease of native coronary artery without angina pectoris: Secondary | ICD-10-CM | POA: Diagnosis not present

## 2022-04-26 DIAGNOSIS — Z1331 Encounter for screening for depression: Secondary | ICD-10-CM | POA: Diagnosis not present

## 2022-04-26 DIAGNOSIS — E782 Mixed hyperlipidemia: Secondary | ICD-10-CM | POA: Diagnosis not present

## 2022-04-26 DIAGNOSIS — M5431 Sciatica, right side: Secondary | ICD-10-CM | POA: Diagnosis not present

## 2022-04-26 DIAGNOSIS — Z0000 Encounter for general adult medical examination without abnormal findings: Secondary | ICD-10-CM | POA: Diagnosis not present

## 2022-04-30 DIAGNOSIS — E11319 Type 2 diabetes mellitus with unspecified diabetic retinopathy without macular edema: Secondary | ICD-10-CM | POA: Diagnosis not present

## 2022-08-08 DIAGNOSIS — M5431 Sciatica, right side: Secondary | ICD-10-CM | POA: Diagnosis not present

## 2022-08-08 DIAGNOSIS — E6609 Other obesity due to excess calories: Secondary | ICD-10-CM | POA: Diagnosis not present

## 2022-08-08 DIAGNOSIS — M1712 Unilateral primary osteoarthritis, left knee: Secondary | ICD-10-CM | POA: Diagnosis not present

## 2022-08-08 DIAGNOSIS — E1165 Type 2 diabetes mellitus with hyperglycemia: Secondary | ICD-10-CM | POA: Diagnosis not present

## 2022-08-08 DIAGNOSIS — E11319 Type 2 diabetes mellitus with unspecified diabetic retinopathy without macular edema: Secondary | ICD-10-CM | POA: Diagnosis not present

## 2022-08-08 DIAGNOSIS — Z6829 Body mass index (BMI) 29.0-29.9, adult: Secondary | ICD-10-CM | POA: Diagnosis not present

## 2022-08-19 DIAGNOSIS — R269 Unspecified abnormalities of gait and mobility: Secondary | ICD-10-CM | POA: Diagnosis not present

## 2022-08-19 DIAGNOSIS — M545 Low back pain, unspecified: Secondary | ICD-10-CM | POA: Diagnosis not present

## 2022-08-19 DIAGNOSIS — M2569 Stiffness of other specified joint, not elsewhere classified: Secondary | ICD-10-CM | POA: Diagnosis not present

## 2022-08-19 DIAGNOSIS — M62551 Muscle wasting and atrophy, not elsewhere classified, right thigh: Secondary | ICD-10-CM | POA: Diagnosis not present

## 2022-08-28 DIAGNOSIS — R269 Unspecified abnormalities of gait and mobility: Secondary | ICD-10-CM | POA: Diagnosis not present

## 2022-08-28 DIAGNOSIS — M62551 Muscle wasting and atrophy, not elsewhere classified, right thigh: Secondary | ICD-10-CM | POA: Diagnosis not present

## 2022-08-28 DIAGNOSIS — M545 Low back pain, unspecified: Secondary | ICD-10-CM | POA: Diagnosis not present

## 2022-08-28 DIAGNOSIS — M2569 Stiffness of other specified joint, not elsewhere classified: Secondary | ICD-10-CM | POA: Diagnosis not present

## 2022-09-02 DIAGNOSIS — M62551 Muscle wasting and atrophy, not elsewhere classified, right thigh: Secondary | ICD-10-CM | POA: Diagnosis not present

## 2022-09-02 DIAGNOSIS — M2569 Stiffness of other specified joint, not elsewhere classified: Secondary | ICD-10-CM | POA: Diagnosis not present

## 2022-09-02 DIAGNOSIS — M545 Low back pain, unspecified: Secondary | ICD-10-CM | POA: Diagnosis not present

## 2022-09-02 DIAGNOSIS — R269 Unspecified abnormalities of gait and mobility: Secondary | ICD-10-CM | POA: Diagnosis not present

## 2022-09-04 DIAGNOSIS — R269 Unspecified abnormalities of gait and mobility: Secondary | ICD-10-CM | POA: Diagnosis not present

## 2022-09-04 DIAGNOSIS — M62551 Muscle wasting and atrophy, not elsewhere classified, right thigh: Secondary | ICD-10-CM | POA: Diagnosis not present

## 2022-09-04 DIAGNOSIS — M545 Low back pain, unspecified: Secondary | ICD-10-CM | POA: Diagnosis not present

## 2022-09-04 DIAGNOSIS — M2569 Stiffness of other specified joint, not elsewhere classified: Secondary | ICD-10-CM | POA: Diagnosis not present

## 2022-09-09 DIAGNOSIS — R269 Unspecified abnormalities of gait and mobility: Secondary | ICD-10-CM | POA: Diagnosis not present

## 2022-09-09 DIAGNOSIS — M62551 Muscle wasting and atrophy, not elsewhere classified, right thigh: Secondary | ICD-10-CM | POA: Diagnosis not present

## 2022-09-09 DIAGNOSIS — M2569 Stiffness of other specified joint, not elsewhere classified: Secondary | ICD-10-CM | POA: Diagnosis not present

## 2022-09-09 DIAGNOSIS — M545 Low back pain, unspecified: Secondary | ICD-10-CM | POA: Diagnosis not present

## 2022-09-13 DIAGNOSIS — R269 Unspecified abnormalities of gait and mobility: Secondary | ICD-10-CM | POA: Diagnosis not present

## 2022-09-13 DIAGNOSIS — M545 Low back pain, unspecified: Secondary | ICD-10-CM | POA: Diagnosis not present

## 2022-09-13 DIAGNOSIS — M62551 Muscle wasting and atrophy, not elsewhere classified, right thigh: Secondary | ICD-10-CM | POA: Diagnosis not present

## 2022-09-13 DIAGNOSIS — M2569 Stiffness of other specified joint, not elsewhere classified: Secondary | ICD-10-CM | POA: Diagnosis not present

## 2022-09-16 DIAGNOSIS — M62551 Muscle wasting and atrophy, not elsewhere classified, right thigh: Secondary | ICD-10-CM | POA: Diagnosis not present

## 2022-09-16 DIAGNOSIS — R269 Unspecified abnormalities of gait and mobility: Secondary | ICD-10-CM | POA: Diagnosis not present

## 2022-09-16 DIAGNOSIS — M545 Low back pain, unspecified: Secondary | ICD-10-CM | POA: Diagnosis not present

## 2022-09-16 DIAGNOSIS — M2569 Stiffness of other specified joint, not elsewhere classified: Secondary | ICD-10-CM | POA: Diagnosis not present

## 2022-09-19 DIAGNOSIS — R269 Unspecified abnormalities of gait and mobility: Secondary | ICD-10-CM | POA: Diagnosis not present

## 2022-09-19 DIAGNOSIS — M545 Low back pain, unspecified: Secondary | ICD-10-CM | POA: Diagnosis not present

## 2022-09-19 DIAGNOSIS — M2569 Stiffness of other specified joint, not elsewhere classified: Secondary | ICD-10-CM | POA: Diagnosis not present

## 2022-09-19 DIAGNOSIS — M62551 Muscle wasting and atrophy, not elsewhere classified, right thigh: Secondary | ICD-10-CM | POA: Diagnosis not present

## 2022-09-23 DIAGNOSIS — M2569 Stiffness of other specified joint, not elsewhere classified: Secondary | ICD-10-CM | POA: Diagnosis not present

## 2022-09-23 DIAGNOSIS — M62551 Muscle wasting and atrophy, not elsewhere classified, right thigh: Secondary | ICD-10-CM | POA: Diagnosis not present

## 2022-09-23 DIAGNOSIS — M545 Low back pain, unspecified: Secondary | ICD-10-CM | POA: Diagnosis not present

## 2022-09-23 DIAGNOSIS — R269 Unspecified abnormalities of gait and mobility: Secondary | ICD-10-CM | POA: Diagnosis not present

## 2022-09-26 DIAGNOSIS — M545 Low back pain, unspecified: Secondary | ICD-10-CM | POA: Diagnosis not present

## 2022-09-26 DIAGNOSIS — R269 Unspecified abnormalities of gait and mobility: Secondary | ICD-10-CM | POA: Diagnosis not present

## 2022-09-26 DIAGNOSIS — M2569 Stiffness of other specified joint, not elsewhere classified: Secondary | ICD-10-CM | POA: Diagnosis not present

## 2022-09-26 DIAGNOSIS — M62551 Muscle wasting and atrophy, not elsewhere classified, right thigh: Secondary | ICD-10-CM | POA: Diagnosis not present

## 2022-09-30 DIAGNOSIS — R269 Unspecified abnormalities of gait and mobility: Secondary | ICD-10-CM | POA: Diagnosis not present

## 2022-09-30 DIAGNOSIS — M2569 Stiffness of other specified joint, not elsewhere classified: Secondary | ICD-10-CM | POA: Diagnosis not present

## 2022-09-30 DIAGNOSIS — M62551 Muscle wasting and atrophy, not elsewhere classified, right thigh: Secondary | ICD-10-CM | POA: Diagnosis not present

## 2022-09-30 DIAGNOSIS — M545 Low back pain, unspecified: Secondary | ICD-10-CM | POA: Diagnosis not present

## 2022-10-03 DIAGNOSIS — M2569 Stiffness of other specified joint, not elsewhere classified: Secondary | ICD-10-CM | POA: Diagnosis not present

## 2022-10-03 DIAGNOSIS — M62551 Muscle wasting and atrophy, not elsewhere classified, right thigh: Secondary | ICD-10-CM | POA: Diagnosis not present

## 2022-10-03 DIAGNOSIS — R269 Unspecified abnormalities of gait and mobility: Secondary | ICD-10-CM | POA: Diagnosis not present

## 2022-10-03 DIAGNOSIS — M545 Low back pain, unspecified: Secondary | ICD-10-CM | POA: Diagnosis not present

## 2022-10-07 DIAGNOSIS — M2569 Stiffness of other specified joint, not elsewhere classified: Secondary | ICD-10-CM | POA: Diagnosis not present

## 2022-10-07 DIAGNOSIS — M62551 Muscle wasting and atrophy, not elsewhere classified, right thigh: Secondary | ICD-10-CM | POA: Diagnosis not present

## 2022-10-07 DIAGNOSIS — R269 Unspecified abnormalities of gait and mobility: Secondary | ICD-10-CM | POA: Diagnosis not present

## 2022-10-07 DIAGNOSIS — M545 Low back pain, unspecified: Secondary | ICD-10-CM | POA: Diagnosis not present

## 2022-10-10 DIAGNOSIS — M545 Low back pain, unspecified: Secondary | ICD-10-CM | POA: Diagnosis not present

## 2022-10-10 DIAGNOSIS — M2569 Stiffness of other specified joint, not elsewhere classified: Secondary | ICD-10-CM | POA: Diagnosis not present

## 2022-10-10 DIAGNOSIS — R269 Unspecified abnormalities of gait and mobility: Secondary | ICD-10-CM | POA: Diagnosis not present

## 2022-10-10 DIAGNOSIS — M62551 Muscle wasting and atrophy, not elsewhere classified, right thigh: Secondary | ICD-10-CM | POA: Diagnosis not present

## 2022-10-16 DIAGNOSIS — M62551 Muscle wasting and atrophy, not elsewhere classified, right thigh: Secondary | ICD-10-CM | POA: Diagnosis not present

## 2022-10-16 DIAGNOSIS — M2569 Stiffness of other specified joint, not elsewhere classified: Secondary | ICD-10-CM | POA: Diagnosis not present

## 2022-10-16 DIAGNOSIS — R269 Unspecified abnormalities of gait and mobility: Secondary | ICD-10-CM | POA: Diagnosis not present

## 2022-10-16 DIAGNOSIS — M545 Low back pain, unspecified: Secondary | ICD-10-CM | POA: Diagnosis not present

## 2022-10-18 DIAGNOSIS — M2569 Stiffness of other specified joint, not elsewhere classified: Secondary | ICD-10-CM | POA: Diagnosis not present

## 2022-10-18 DIAGNOSIS — M62551 Muscle wasting and atrophy, not elsewhere classified, right thigh: Secondary | ICD-10-CM | POA: Diagnosis not present

## 2022-10-18 DIAGNOSIS — M545 Low back pain, unspecified: Secondary | ICD-10-CM | POA: Diagnosis not present

## 2022-10-18 DIAGNOSIS — R269 Unspecified abnormalities of gait and mobility: Secondary | ICD-10-CM | POA: Diagnosis not present

## 2022-10-23 DIAGNOSIS — M62551 Muscle wasting and atrophy, not elsewhere classified, right thigh: Secondary | ICD-10-CM | POA: Diagnosis not present

## 2022-10-23 DIAGNOSIS — M2569 Stiffness of other specified joint, not elsewhere classified: Secondary | ICD-10-CM | POA: Diagnosis not present

## 2022-10-23 DIAGNOSIS — M545 Low back pain, unspecified: Secondary | ICD-10-CM | POA: Diagnosis not present

## 2022-10-23 DIAGNOSIS — R269 Unspecified abnormalities of gait and mobility: Secondary | ICD-10-CM | POA: Diagnosis not present

## 2022-10-30 DIAGNOSIS — M2569 Stiffness of other specified joint, not elsewhere classified: Secondary | ICD-10-CM | POA: Diagnosis not present

## 2022-10-30 DIAGNOSIS — M62551 Muscle wasting and atrophy, not elsewhere classified, right thigh: Secondary | ICD-10-CM | POA: Diagnosis not present

## 2022-10-30 DIAGNOSIS — R269 Unspecified abnormalities of gait and mobility: Secondary | ICD-10-CM | POA: Diagnosis not present

## 2022-10-30 DIAGNOSIS — M545 Low back pain, unspecified: Secondary | ICD-10-CM | POA: Diagnosis not present

## 2022-11-04 DIAGNOSIS — M62551 Muscle wasting and atrophy, not elsewhere classified, right thigh: Secondary | ICD-10-CM | POA: Diagnosis not present

## 2022-11-04 DIAGNOSIS — M545 Low back pain, unspecified: Secondary | ICD-10-CM | POA: Diagnosis not present

## 2022-11-04 DIAGNOSIS — M2569 Stiffness of other specified joint, not elsewhere classified: Secondary | ICD-10-CM | POA: Diagnosis not present

## 2022-11-04 DIAGNOSIS — R269 Unspecified abnormalities of gait and mobility: Secondary | ICD-10-CM | POA: Diagnosis not present

## 2022-11-14 ENCOUNTER — Encounter (INDEPENDENT_AMBULATORY_CARE_PROVIDER_SITE_OTHER): Payer: Self-pay | Admitting: Gastroenterology

## 2022-11-14 ENCOUNTER — Ambulatory Visit (INDEPENDENT_AMBULATORY_CARE_PROVIDER_SITE_OTHER): Payer: Medicare HMO | Admitting: Gastroenterology

## 2022-11-14 ENCOUNTER — Telehealth (INDEPENDENT_AMBULATORY_CARE_PROVIDER_SITE_OTHER): Payer: Self-pay | Admitting: *Deleted

## 2022-11-14 VITALS — BP 121/74 | HR 65 | Temp 97.9°F | Ht 60.5 in | Wt 162.3 lb

## 2022-11-14 DIAGNOSIS — R10813 Right lower quadrant abdominal tenderness: Secondary | ICD-10-CM

## 2022-11-14 DIAGNOSIS — R109 Unspecified abdominal pain: Secondary | ICD-10-CM | POA: Diagnosis not present

## 2022-11-14 NOTE — Progress Notes (Addendum)
Referring Provider: [name], MD Primary Care Physician:  [name], MD Primary GI Physician: Previously Rehman  Chief Complaint  Patient presents with   Irritable Bowel Syndrome    Follow up on IBS. Does have concerns about abdominal pain on both sides.    HPI:   [name] is a 76 y.o. female with past medical history of  CAD, DM, fatty liver, GERD, high cholesterol, and HTN.    Patient presenting today for abdominal pain  Last seen August 2022, at that time, endorsed some mild lower abd pain once per month around the time she would have normally gotten her menstrual cycle when she was younger. States she takes advil for this with relief. States that she normally has 1-2 BM per day, denies constipation or diarrhea. She denies melena or hematochezia, nausea, vomiting, reflux, weight loss, dysphagia.   Recommend continued advil PRN for pain, follow up with OBGYN.  Present:  She reports she continues to have intermittent lower abdominal pain at the time that her menstrual cycle previously would have come on. She is now having some right sided abdominal pain that goes from just under her ribs down to RLQ. Pain is intermittent. States that appetite is not great and she does not eat a lot. Appetite has gone down over the past  She notes that she has had some recent losses and had more stress. Denies nausea or vomiting. No constipation or diarrhea. She has lost about 14 pounds since last December. Does note that she has been doing therapy and exercises for her back for the past few months. She does not think that eating brings the pain on, feels it may sometimes improve pain. No rectal bleeding or melena. Has occasional heartburn, takes otc medication for this which helps, though does not have issues very often. No dysphagia or odynophagia. Having BM 1-2x/day.   Last Colonoscopy:(01/14/20)- The examined portion of the ileum was normal. - Diverticulosis in the sigmoid colon. - No  specimens collected. Last Endoscopy:n/a    Past Medical History:  Diagnosis Date   Arthritis    CAD (coronary artery disease)    Cancer ([place])    Diabetes mellitus ([place])    type 2 x  1 or 2 yrs   Fatty liver    GERD (gastroesophageal reflux disease)    High cholesterol    Hypertension    Seasonal allergies     Past Surgical History:  Procedure Laterality Date   CARDIAC CATHETERIZATION  09/11/2007   100% occlusion of the left circumflex. proceed with stenting   CARDIAC CATHETERIZATION  09/11/2007   mid circumflex occlusion with tandem 80% proximal LAD lesion to the 50% mid LAD lesion. nonDES stenting of the circumflex occlusion after recanalization and balloon dilatation, the stent was a 2.75x59m Liberte, TIMI3 flow was restored   CARDIOVASCULAR STRESS TEST  03/24/2012   normal pattern of perfusion in all regions, no scintigraphic evidence of inducible myocardial ischemia   CHOLECYSTECTOMY  2008   COLONOSCOPY  06/17/2012   Procedure: COLONOSCOPY;  Surgeon: NRogene Houston MD;  [facility];  Service: Endoscopy;  Laterality: N/A;  100   COLONOSCOPY WITH PROPOFOL N/A 01/14/2020   Procedure: COLONOSCOPY WITH PROPOFOL;  Surgeon: RRogene Houston MD;  [facility];  Service: Endoscopy;  Laterality: N/A;  1[place] 07/20/2012   EF 60-65%, wall motion normal, there were no regional wall motion abnormalities   Left breast lumpectomy  LOWER EXTREMITY VENOUS DOPPLER Left 12/27/2011   no evidence of deep vein thrombosis involving the left lower extremity and right common femoral vein, enlarged inguinal lymph node is visualized on the left. no baker's cyst on the left.   PARTIAL HYSTERECTOMY      Current Outpatient Medications  Medication Sig Dispense Refill   amLODipine (NORVASC) 10 MG tablet Take 1 tablet (10 mg total) by mouth daily. 30 tablet 2   aspirin EC 81 MG tablet Take 81 mg by mouth every morning.     Cholecalciferol (VITAMIN D) 125  MCG (5000 UT) CAPS Take 1 tablet by mouth daily.     clopidogrel (PLAVIX) 75 MG tablet Take 1 tablet (75 mg total) by mouth every morning. 90 tablet 0   empagliflozin (JARDIANCE) 25 MG TABS tablet Take 25 mg by mouth daily.     glipiZIDE (GLUCOTROL) 10 MG tablet Take 10 mg by mouth daily before breakfast.     JANUMET XR [phone] MG TB24 Take 1 tablet by mouth daily.     lisinopril (PRINIVIL,ZESTRIL) 20 MG tablet Take 20 mg by mouth daily.     nitroGLYCERIN (NITROSTAT) 0.4 MG SL tablet Place 1 tablet (0.4 mg total) under the tongue every 5 (five) minutes as needed for chest pain. 25 tablet 3   Polyethyl Glycol-Propyl Glycol (SYSTANE OP) Apply 1 drop to eye 2 (two) times daily.      rosuvastatin (CRESTOR) 10 MG tablet Take 10 mg by mouth daily.      triamcinolone (KENALOG) 0.025 % ointment Apply 1 application topically 2 (two) times daily. 80 g 0   No current facility-administered medications for this visit.    Allergies as of 11/14/2022   (No Known Allergies)    Family History  Problem Relation Age of Onset   Heart disease Mother    Cancer Mother        Breast cancer    Social History   Socioeconomic History   Marital status: Single    Spouse name: Not on file   Number of children: 1   Years of education: Not on file   Highest education level: Not on file  Occupational History   Not on file  Tobacco Use   Smoking status: Never    Passive exposure: Never   Smokeless tobacco: Never  Vaping Use   Vaping Use: Never used  Substance and Sexual Activity   Alcohol use: No   Drug use: No   Sexual activity: Not Currently    Birth control/protection: Surgical  Other Topics Concern   Not on file  Social History Narrative   Not on file   Social Determinants of Health   Financial Resource Strain: Not on file  Food Insecurity: Not on file  Transportation Needs: Not on file  Physical Activity: Not on file  Stress: Not on file  Social Connections: Not on file   Review of  systems General: negative for malaise, night sweats, fever, chills, weight loss Neck: Negative for lumps, goiter, pain and significant neck swelling Resp: Negative for cough, wheezing, dyspnea at rest CV: Negative for chest pain, leg swelling, palpitations, orthopnea GI: denies melena, hematochezia, nausea, vomiting, diarrhea, constipation, dysphagia, odyonophagia, early satiety or unintentional weight loss. +right sided abdominal pain MSK: Negative for joint pain or swelling, back pain, and muscle pain. Derm: Negative for itching or rash Psych: Denies depression, anxiety, memory loss, confusion. No homicidal or suicidal ideation.  Heme: Negative for prolonged bleeding, bruising easily, and swollen nodes. Endocrine: Negative  for cold or heat intolerance, polyuria, polydipsia and goiter. Neuro: negative for tremor, gait imbalance, syncope and seizures. The remainder of the review of systems is noncontributory.  Physical Exam: BP 121/74 (BP Location: Left Arm, Patient Position: Sitting, Cuff Size: Large)   Pulse 65   Temp 97.9 F (36.6 C) (Oral)   Ht 5' 0.5" (1.537 m)   Wt 162 lb 4.8 oz (73.6 kg)   BMI 31.18 kg/m  General:   Alert and oriented. No distress noted. Pleasant and cooperative.  Head:  Normocephalic and atraumatic. Eyes:  Conjuctiva clear without scleral icterus. Mouth:  Oral mucosa pink and moist. Good dentition. No lesions. Heart: Normal rate and rhythm, s1 and s2 heart sounds present.  Lungs: Clear lung sounds in all lobes. Respirations equal and unlabored. Abdomen:  +BS, soft, non-distended. TTP of RLQ, more mild TTP of RUQ.  No rebound or guarding. No HSM or masses noted. Derm: No palmar erythema or jaundice Msk:  Symmetrical without gross deformities. Normal posture. Extremities:  Without edema. Neurologic:  Alert and  oriented x4 Psych:  Alert and cooperative. Normal mood and affect.  Invalid input(s): "6 MONTHS"   ASSESSMENT: SYRAH DAUGHTREY is a 76 y.o. female  presenting today for abdominal pain.  Patient with some right-sided abdominal pain that does not seem to be precipitated by anything specific. Per chart review appears she has had this pain off and on for the past few years.  She does note that sometimes right upper abdominal pain improves with eating.  She has no constipation diarrhea rectal bleeding or melena.  No associated nausea or vomiting.  Abdominal exam reveals some right upper and lower quadrant tenderness. While There is no presence of rebound would recommend proceeding with CT abdomen pelvis for further evaluation of her abdominal pain/tenderness as ultimately cannot rule out visceral etiology. Suspect pain may be related to IBS especially given more recent stressors and feeling that pain worsened during that time, queried previously if this was referred pain from her back. I did offer her dicyclomine for symptomatic relief however patient did not feel she needed anything for pain at this time. Depending on findings of CT, may consider EGD for upper abdominal pain, last Colonoscopy in 2021 with only diverticulosis.    PLAN:  CT A/P with contrast  2. Consider EGD for RUQ pain if CT negative/pain persists  All questions were answered, patient verbalized understanding and is in agreement with plan as outlined above.   Follow Up: 3 months   Lindy Pennisi L. Alver Sorrow, MSN, APRN, AGNP-C Adult-Gerontology Nurse Practitioner [facility]  I have reviewed the note and agree with the APP's assessment as described in this progress note  Maylon Peppers, MD Gastroenterology and Hepatology [facility] Gastroenterology

## 2022-11-14 NOTE — Patient Instructions (Signed)
We will get you scheduled for CT scan of the abdomen to further evaluate your pain If you have worsening pain or any new associated symptoms, please let me know  Follow up 3 months

## 2022-11-14 NOTE — Telephone Encounter (Signed)
Called pt. She is aware of appt details for CT.  PA approved via [employer]. Josem Kaufmann [phone] , DOS:  11/15/22-12/15/2022

## 2022-11-28 ENCOUNTER — Ambulatory Visit (HOSPITAL_COMMUNITY)
Admission: RE | Admit: 2022-11-28 | Discharge: 2022-11-28 | Disposition: A | Discharge status: Home / Self Care | Payer: Medicare HMO | Source: Ambulatory Visit | Attending: Gastroenterology | Admitting: Gastroenterology

## 2022-11-28 DIAGNOSIS — R109 Unspecified abdominal pain: Secondary | ICD-10-CM | POA: Diagnosis not present

## 2022-11-28 DIAGNOSIS — K760 Fatty (change of) liver, not elsewhere classified: Secondary | ICD-10-CM | POA: Diagnosis not present

## 2022-11-28 DIAGNOSIS — R10813 Right lower quadrant abdominal tenderness: Secondary | ICD-10-CM | POA: Insufficient documentation

## 2022-11-28 LAB — POCT I-STAT CREATININE: Creatinine, Ser: 0.9 mg/dL (ref 0.44–1.00)

## 2022-11-28 MED ORDER — IOHEXOL 9 MG/ML PO SOLN
ORAL | Status: AC
  Filled 2022-11-28: qty 1000

## 2022-11-28 MED ORDER — IOHEXOL 300 MG/ML  SOLN
100.0000 mL | Freq: Once | INTRAMUSCULAR | Status: AC | PRN
  Administered 2022-11-28: 100 mL via INTRAVENOUS

## 2022-12-13 ENCOUNTER — Other Ambulatory Visit (HOSPITAL_COMMUNITY): Payer: Self-pay | Admitting: Internal Medicine

## 2022-12-13 DIAGNOSIS — Z1231 Encounter for screening mammogram for malignant neoplasm of breast: Secondary | ICD-10-CM

## 2023-01-24 ENCOUNTER — Ambulatory Visit (HOSPITAL_COMMUNITY)
Admission: RE | Admit: 2023-01-24 | Discharge: 2023-01-24 | Disposition: A | Discharge status: Home / Self Care | Payer: Medicare HMO | Source: Ambulatory Visit | Attending: Internal Medicine | Admitting: Internal Medicine

## 2023-01-24 DIAGNOSIS — Z1231 Encounter for screening mammogram for malignant neoplasm of breast: Secondary | ICD-10-CM

## 2023-02-12 DIAGNOSIS — E119 Type 2 diabetes mellitus without complications: Secondary | ICD-10-CM | POA: Diagnosis not present

## 2023-02-13 DIAGNOSIS — E1165 Type 2 diabetes mellitus with hyperglycemia: Secondary | ICD-10-CM | POA: Diagnosis not present

## 2023-02-13 DIAGNOSIS — E663 Overweight: Secondary | ICD-10-CM | POA: Diagnosis not present

## 2023-02-13 DIAGNOSIS — Z6828 Body mass index (BMI) 28.0-28.9, adult: Secondary | ICD-10-CM | POA: Diagnosis not present

## 2023-02-13 DIAGNOSIS — E11319 Type 2 diabetes mellitus with unspecified diabetic retinopathy without macular edema: Secondary | ICD-10-CM | POA: Diagnosis not present

## 2023-02-24 ENCOUNTER — Encounter (INDEPENDENT_AMBULATORY_CARE_PROVIDER_SITE_OTHER): Payer: Self-pay | Admitting: Gastroenterology

## 2023-02-24 ENCOUNTER — Ambulatory Visit (INDEPENDENT_AMBULATORY_CARE_PROVIDER_SITE_OTHER): Payer: Medicare HMO | Admitting: Gastroenterology

## 2023-02-24 VITALS — BP 129/75 | HR 60 | Temp 96.6°F | Ht 60.5 in | Wt 160.2 lb

## 2023-02-24 DIAGNOSIS — R110 Nausea: Secondary | ICD-10-CM | POA: Diagnosis not present

## 2023-02-24 DIAGNOSIS — R109 Unspecified abdominal pain: Secondary | ICD-10-CM

## 2023-02-24 NOTE — Patient Instructions (Signed)
Please let me know if you wish to proceed with upper endoscopy for further evaluation of your abdominal pain Be mindful that stress can play a big role on the GI tract. Try to get plenty of rest and make sure you are managing your stress/anxiety  Follow up 3 months  It was a pleasure to see you today. I want to create trusting relationships with patients and provide genuine, compassionate, and quality care. I truly value your feedback! please be on the lookout for a survey regarding your visit with me today. I appreciate your input about our visit and your time in completing this!    [name] L. Alver Sorrow, MSN, APRN, AGNP-C Adult-Gerontology Nurse Practitioner [facility] Gastroenterology at [facility]

## 2023-02-24 NOTE — Progress Notes (Signed)
Referring Provider: [name], MD Primary Care Physician:  [name], MD Primary GI Physician: [name]   Chief Complaint  Patient presents with   Follow-up    Patient here today for a three month follow up. Patient says she is unsure why she is here today. She says she is still having issues with right sided abdominal pains at times.    HPI:   [name] is a 77 y.o. female with past medical history of CAD, DM, fatty liver, GERD, high cholesterol, and HTN.    Patient presenting today for folow up of abdominal pain.  Last seen December 2023, at that time, patient having  intermittent lower abdominal pain at the time that her menstrual cycle previously would have come on. She is now having some right sided abdominal pain that goes from just under her ribs down to RLQ. appetite is not great and she does not eat a lot. She does not think that eating brings the pain on, feels it may sometimes improve pain.  occasional heartburn, takes otc medication for this which helps, though does not have issues very often.  Having BM 1-2x/day.   Recommended to have CT A/P with contrast, consider EGD if pain persisted and CT negative.  CT A/P 11/28/22 with no acute findings, recommended to proceed with EGD, however, patient did not want to proceed with EGD.  Present:  Patient states that she continues to have some Right upper abdominal, stated flaring up again last week. She also notes having some hot flashes as well recently.  She is unsure of precipitating factors. Sometimes tylenol improves the pain. She has some nausea at times but states she cannot throw up. Appetite is okay, she does 1-2 meals per day. She notes a lot of growling in her stomach. Feels somewhat better after eating. Denies rectal bleeding or melena. Denies constipation or diarrhea. She is on jardiance and notes some decrease in fluid retention and some weight loss since then as she is urinating quite a bit. Denies heartburn or  acid reflux. 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    Last Colonoscopy:(01/14/20)- The examined portion of the ileum was normal. - Diverticulosis in the sigmoid colon. - No specimens collected. Last Endoscopy:n/a   Past Medical History:  Diagnosis Date   Arthritis    CAD (coronary artery disease)    Cancer ([place])    Diabetes mellitus ([place])    type 2 x  1 or 2 yrs   Fatty liver    GERD (gastroesophageal reflux disease)    High cholesterol    Hypertension    Seasonal allergies     Past Surgical History:  Procedure Laterality Date   CARDIAC CATHETERIZATION  09/11/2007   100% occlusion of the left circumflex. proceed with stenting   CARDIAC CATHETERIZATION  09/11/2007   mid circumflex occlusion with tandem 80% proximal LAD lesion to the 50% mid LAD lesion. nonDES stenting of the circumflex occlusion after recanalization and balloon dilatation, the stent was a 2.75x93mm Liberte, TIMI3 flow was restored   CARDIOVASCULAR STRESS TEST  03/24/2012   normal pattern of perfusion in all regions, no scintigraphic evidence of inducible myocardial ischemia   CHOLECYSTECTOMY  2008   COLONOSCOPY  06/17/2012   Procedure: COLONOSCOPY;  Surgeon: Rogene Houston, MD;  [facility];  Service: Endoscopy;  Laterality: N/A;  100  COLONOSCOPY WITH PROPOFOL N/A 01/14/2020   Procedure: COLONOSCOPY WITH PROPOFOL;  Surgeon: Rogene Houston, MD;  [facility];  Service: Endoscopy;  Laterality: N/A;  [place]  07/20/2012   EF 60-65%, wall motion normal, there were no regional wall motion abnormalities   Left breast lumpectomy     LOWER EXTREMITY VENOUS DOPPLER Left 12/27/2011   no evidence of deep vein thrombosis involving the left lower extremity and right common femoral vein, enlarged inguinal lymph node is visualized on the left. no baker's cyst on the left.   PARTIAL  HYSTERECTOMY      Current Outpatient Medications  Medication Sig Dispense Refill   amLODipine (NORVASC) 10 MG tablet Take 1 tablet (10 mg total) by mouth daily. 30 tablet 2   aspirin EC 81 MG tablet Take 81 mg by mouth every morning.     Cholecalciferol (VITAMIN D) 125 MCG (5000 UT) CAPS Take 1 tablet by mouth daily.     clopidogrel (PLAVIX) 75 MG tablet Take 1 tablet (75 mg total) by mouth every morning. 90 tablet 0   empagliflozin (JARDIANCE) 25 MG TABS tablet Take 25 mg by mouth daily.     glipiZIDE (GLUCOTROL) 10 MG tablet Take 10 mg by mouth daily before breakfast.     JANUMET XR [phone] MG TB24 Take 1 tablet by mouth daily.     lisinopril (PRINIVIL,ZESTRIL) 20 MG tablet Take 20 mg by mouth daily.     nitroGLYCERIN (NITROSTAT) 0.4 MG SL tablet Place 1 tablet (0.4 mg total) under the tongue every 5 (five) minutes as needed for chest pain. 25 tablet 3   Polyethyl Glycol-Propyl Glycol (SYSTANE OP) Apply 1 drop to eye 2 (two) times daily.      rosuvastatin (CRESTOR) 10 MG tablet Take 10 mg by mouth daily.      triamcinolone (KENALOG) 0.025 % ointment Apply 1 application topically 2 (two) times daily. 80 g 0   No current facility-administered medications for this visit.    Allergies as of 02/24/2023   (No Known Allergies)    Family History  Problem Relation Age of Onset   Heart disease Mother    Cancer Mother        Breast cancer    Social History   Socioeconomic History   Marital status: Single    Spouse name: Not on file   Number of children: 1   Years of education: Not on file   Highest education level: Not on file  Occupational History   Not on file  Tobacco Use   Smoking status: Never    Passive exposure: Never   Smokeless tobacco: Never  Vaping Use   Vaping Use: Never used  Substance and Sexual Activity   Alcohol use: No   Drug use: No   Sexual activity: Not Currently    Birth control/protection: Surgical  Other Topics Concern   Not on file  Social  History Narrative   Not on file   Social Determinants of Health   Financial Resource Strain: Not on file  Food Insecurity: Not on file  Transportation Needs: Not on file  Physical Activity: Not on file  Stress: Not on file  Social Connections: Not on file   Review of systems General: negative for malaise, night sweats, fever, chills, weight loss Neck: Negative for lumps, goiter, pain and significant neck swelling Resp: Negative for cough, wheezing, dyspnea at rest CV: Negative for chest pain, leg swelling, palpitations, orthopnea GI: denies melena, hematochezia,  vomiting, diarrhea, constipation, dysphagia, odyonophagia, early satiety or unintentional weight loss. +RUQ pain +nausea MSK: Negative for joint pain or swelling, back pain, and muscle pain. Derm: Negative for itching or rash Psych: Denies depression, anxiety, memory loss, confusion. No homicidal or suicidal ideation.  Heme: Negative for prolonged bleeding, bruising easily, and swollen nodes. Endocrine: Negative for cold or heat intolerance, polyuria, polydipsia and goiter. Neuro: negative for tremor, gait imbalance, syncope and seizures. The remainder of the review of systems is noncontributory.  Physical Exam: BP (!) 151/80 (BP Location: Left Arm, Patient Position: Sitting, Cuff Size: Normal)   Pulse 64   Temp (!) 96.6 F (35.9 C) (Temporal)   Ht 5' 0.5" (1.537 m)   Wt 160 lb 3.2 oz (72.7 kg)   BMI 30.77 kg/m  General:   Alert and oriented. No distress noted. Pleasant and cooperative.  Head:  Normocephalic and atraumatic. Eyes:  Conjuctiva clear without scleral icterus. Mouth:  Oral mucosa pink and moist. Good dentition. No lesions. Heart: Normal rate and rhythm, s1 and s2 heart sounds present.  Lungs: Clear lung sounds in all lobes. Respirations equal and unlabored. Abdomen:  +BS, soft, non-tender and non-distended. No rebound or guarding. No HSM or masses noted. Derm: No palmar erythema or jaundice Msk:   Symmetrical without gross deformities. Normal posture. Extremities:  Without edema. Neurologic:  Alert and  oriented x4 Psych:  Alert and cooperative. Normal mood and affect.  Invalid input(s): "6 MONTHS"   ASSESSMENT: [name] is a 77 y.o. female presenting today for follow up of right sided abdominal pain.  Patient continues to have Right sided mid to upper abdominal pain. Unclear of specific precipitating factors. Sometimes eating and tylenol improve her pain. She has some nausea, no overt GERD symptoms, rectal bleeding, melena, or early satiety. Symptoms certainly could be functional or secondary to higher stress/anxiety.  I did recommend proceeding with EGD previously, however, patient declined at that time. Again today, discussed proceeding with endoscopic evaluation of her symptoms as I cannot rule out PUD, H pylori, gastritis, duodenitis. At this time patient is unsure if she wants to proceed with EGD. She is going to think about it and let me know.    PLAN:  Pt to make me aware if she wishes to proceed with EGD (ASA III) 2. Good stress/anxiety management    All questions were answered, patient verbalized understanding and is in agreement with plan as outlined above.    Follow Up: 3 months   Porchea Charrier L. Alver Sorrow, MSN, APRN, AGNP-C Adult-Gerontology Nurse Practitioner [facility] for GI Diseases  I have reviewed the note and agree with the APP's assessment as described in this progress note  Maylon Peppers, MD Gastroenterology and Hepatology [facility] Gastroenterology

## 2023-05-01 DIAGNOSIS — M1712 Unilateral primary osteoarthritis, left knee: Secondary | ICD-10-CM | POA: Diagnosis not present

## 2023-05-01 DIAGNOSIS — Z6827 Body mass index (BMI) 27.0-27.9, adult: Secondary | ICD-10-CM | POA: Diagnosis not present

## 2023-05-01 DIAGNOSIS — Z1331 Encounter for screening for depression: Secondary | ICD-10-CM | POA: Diagnosis not present

## 2023-05-01 DIAGNOSIS — E663 Overweight: Secondary | ICD-10-CM | POA: Diagnosis not present

## 2023-05-01 DIAGNOSIS — R232 Flushing: Secondary | ICD-10-CM | POA: Diagnosis not present

## 2023-05-01 DIAGNOSIS — Z0001 Encounter for general adult medical examination with abnormal findings: Secondary | ICD-10-CM | POA: Diagnosis not present

## 2023-05-01 DIAGNOSIS — E11319 Type 2 diabetes mellitus with unspecified diabetic retinopathy without macular edema: Secondary | ICD-10-CM | POA: Diagnosis not present

## 2023-05-02 DIAGNOSIS — R232 Flushing: Secondary | ICD-10-CM | POA: Diagnosis not present

## 2023-05-26 ENCOUNTER — Emergency Department (HOSPITAL_COMMUNITY): Payer: Medicare PPO

## 2023-05-26 ENCOUNTER — Other Ambulatory Visit: Payer: Self-pay

## 2023-05-26 ENCOUNTER — Encounter (HOSPITAL_COMMUNITY): Payer: Self-pay | Admitting: Emergency Medicine

## 2023-05-26 ENCOUNTER — Emergency Department (HOSPITAL_COMMUNITY)
Admission: EM | Admit: 2023-05-26 | Discharge: 2023-05-26 | Disposition: A | Discharge status: Home / Self Care | Payer: Medicare PPO | Attending: Emergency Medicine | Admitting: Emergency Medicine

## 2023-05-26 DIAGNOSIS — I2510 Atherosclerotic heart disease of native coronary artery without angina pectoris: Secondary | ICD-10-CM | POA: Insufficient documentation

## 2023-05-26 DIAGNOSIS — R61 Generalized hyperhidrosis: Secondary | ICD-10-CM | POA: Diagnosis not present

## 2023-05-26 DIAGNOSIS — I10 Essential (primary) hypertension: Secondary | ICD-10-CM | POA: Diagnosis not present

## 2023-05-26 DIAGNOSIS — E119 Type 2 diabetes mellitus without complications: Secondary | ICD-10-CM | POA: Diagnosis not present

## 2023-05-26 DIAGNOSIS — R0789 Other chest pain: Secondary | ICD-10-CM | POA: Diagnosis not present

## 2023-05-26 DIAGNOSIS — Z7982 Long term (current) use of aspirin: Secondary | ICD-10-CM | POA: Diagnosis not present

## 2023-05-26 DIAGNOSIS — R42 Dizziness and giddiness: Secondary | ICD-10-CM | POA: Diagnosis not present

## 2023-05-26 DIAGNOSIS — R110 Nausea: Secondary | ICD-10-CM | POA: Insufficient documentation

## 2023-05-26 DIAGNOSIS — Z79899 Other long term (current) drug therapy: Secondary | ICD-10-CM | POA: Insufficient documentation

## 2023-05-26 DIAGNOSIS — R079 Chest pain, unspecified: Secondary | ICD-10-CM | POA: Insufficient documentation

## 2023-05-26 LAB — HEPATIC FUNCTION PANEL
ALT: 24 U/L (ref 0–44)
AST: 37 U/L (ref 15–41)
Albumin: 4.2 g/dL (ref 3.5–5.0)
Alkaline Phosphatase: 76 U/L (ref 38–126)
Bilirubin, Direct: 0.1 mg/dL (ref 0.0–0.2)
Indirect Bilirubin: 0.7 mg/dL (ref 0.3–0.9)
Total Bilirubin: 0.8 mg/dL (ref 0.3–1.2)
Total Protein: 7.5 g/dL (ref 6.5–8.1)

## 2023-05-26 LAB — BASIC METABOLIC PANEL
Anion gap: 9 (ref 5–15)
BUN: 18 mg/dL (ref 8–23)
CO2: 22 mmol/L (ref 22–32)
Calcium: 9.2 mg/dL (ref 8.9–10.3)
Chloride: 106 mmol/L (ref 98–111)
Creatinine, Ser: 1.03 mg/dL — ABNORMAL HIGH (ref 0.44–1.00)
GFR, Estimated: 56 mL/min — ABNORMAL LOW (ref >60)
Glucose, Bld: 128 mg/dL — ABNORMAL HIGH (ref 70–99)
Potassium: 3.8 mmol/L (ref 3.5–5.1)
Sodium: 137 mmol/L (ref 135–145)

## 2023-05-26 LAB — CBC
HCT: 41.2 % (ref 36.0–46.0)
Hemoglobin: 14.2 g/dL (ref 12.0–15.0)
MCH: 31 pg (ref 26.0–34.0)
MCHC: 34.5 g/dL (ref 30.0–36.0)
MCV: 90 fL (ref 80.0–100.0)
Platelets: 214 10*3/uL (ref 150–400)
RBC: 4.58 MIL/uL (ref 3.87–5.11)
RDW: 13.5 % (ref 11.5–15.5)
WBC: 7.5 10*3/uL (ref 4.0–10.5)
nRBC: 0 % (ref 0.0–0.2)

## 2023-05-26 LAB — MAGNESIUM: Magnesium: 2.1 mg/dL (ref 1.7–2.4)

## 2023-05-26 LAB — PROTIME-INR
INR: 1 (ref 0.8–1.2)
Prothrombin Time: 13.6 seconds (ref 11.4–15.2)

## 2023-05-26 LAB — LIPASE, BLOOD: Lipase: 37 U/L (ref 11–51)

## 2023-05-26 LAB — TROPONIN I (HIGH SENSITIVITY)
Troponin I (High Sensitivity): 5 ng/L (ref <18)
Troponin I (High Sensitivity): 6 ng/L (ref <18)

## 2023-05-26 MED ORDER — ASPIRIN 81 MG PO CHEW
324.0000 mg | CHEWABLE_TABLET | Freq: Once | ORAL | Status: AC
  Administered 2023-05-26: 324 mg via ORAL
  Filled 2023-05-26: qty 4

## 2023-05-26 NOTE — Discharge Instructions (Signed)
Testing done today in the emergency department is reassuring.  Continue home medications as prescribed.  You should hear from the cardiology office within the next couple days to set up a follow-up appointment.  If you do not hear from them, call the number below.  Return to the emergency department at anytime for any new or worsening symptoms of concern.

## 2023-05-26 NOTE — ED Triage Notes (Signed)
Pt c/o left lower chest pain since last night. Pt took 2 NTG last night which did provide temporary relief, but today pain has returned. She has not taken any medications so far today. Pt feels lightheaded and has a headache, and she notes she was very sweaty in church yesterday but that has resolved. She feels intermittently nauseated. Pain rated 5/10 with no radiation

## 2023-05-26 NOTE — ED Provider Notes (Signed)
[place] EMERGENCY DEPARTMENT AT [facility] Provider Note   CSN: [id number] Arrival date & time: 05/26/23  1146     History  Chief Complaint  Patient presents with   Chest Pain    [name] is a 77 y.o. female.   Chest Pain Associated symptoms: headache   Patient presents for chest pain.  Medical history includes HTN, HLD, CAD, IBS, GERD, DM, arthritis.  Last night, patient had left lower chest pain.  It was improved with NTG.  She Had Recurrence of Pain Today.  Yesterday, She Had Lightheadedness and Diaphoresis While in [place].  She Has Had Intermittent Nausea.  Current Pain Is 5/10 in severity.  She has stent placed in 2008.  She had stress test in 2016.  She has been lost to cardiology follow-up for the past 3 years.  She does continue to take aspirin and Plavix but has not taken any medications today.  Yesterday evening, while laying in bed, patient developed left-sided chest pain, located under her left breast.  She took nitro at home with improvement.  Pain subsided and she was able to sleep.  This morning, at approximate 11 AM, she had recurrence of chest pain.  This lasted for approximately 1 hour.  It is currently resolved.  Patient endorses a mild generalized headache but denies any other current symptoms.    Home Medications Prior to Admission medications   Medication Sig Start Date End Date Taking? Authorizing Provider  amLODipine (NORVASC) 10 MG tablet Take 1 tablet (10 mg total) by mouth daily. 12/01/21 02/24/23  Sherryll Burger, Pratik D, DO  aspirin EC 81 MG tablet Take 81 mg by mouth every morning.    [provider]  CALCIUM CARBONATE ANTACID PO Take 1 tablet by mouth daily. Prn.    [provider]  Cholecalciferol (VITAMIN D) 125 MCG (5000 UT) CAPS Take 1 tablet by mouth daily.    [provider]  clopidogrel (PLAVIX) 75 MG tablet Take 1 tablet (75 mg total) by mouth every morning. 11/18/13   Hilty, Lisette Abu, MD  empagliflozin  (JARDIANCE) 25 MG TABS tablet Take 25 mg by mouth daily.    [provider]  JANUMET XR [phone] MG TB24 Take 1 tablet by mouth daily. 09/19/21   [provider]  lisinopril (PRINIVIL,ZESTRIL) 20 MG tablet Take 20 mg by mouth daily. 06/19/17   [provider]  nitroGLYCERIN (NITROSTAT) 0.4 MG SL tablet Place 1 tablet (0.4 mg total) under the tongue every 5 (five) minutes as needed for chest pain. 07/15/17 02/24/23  Laqueta Linden, MD  Polyethyl Glycol-Propyl Glycol (SYSTANE OP) Apply 1 drop to eye 2 (two) times daily.     [provider]  rosuvastatin (CRESTOR) 10 MG tablet Take 10 mg by mouth daily.  05/22/17   [provider]      Allergies    Patient has no known allergies.    Review of Systems   Review of Systems  Cardiovascular:  Positive for chest pain.  Neurological:  Positive for headaches.  All other systems reviewed and are negative.   Physical Exam Updated Vital Signs BP (!) 144/76 (BP Location: Right Arm)   Pulse 64   Temp 98.7 F (37.1 C) (Oral)   Resp 16   Ht 5' (1.524 m)   Wt 71.2 kg   SpO2 100%   BMI 30.66 kg/m  Physical Exam Vitals and nursing note reviewed.  Constitutional:      General: She is not  in acute distress.    Appearance: She is well-developed. She is not ill-appearing, toxic-appearing or diaphoretic.  HENT:     Head: Normocephalic and atraumatic.  Eyes:     Conjunctiva/sclera: Conjunctivae normal.  Cardiovascular:     Rate and Rhythm: Normal rate and regular rhythm.     Heart sounds: No murmur heard. Pulmonary:     Effort: Pulmonary effort is normal. No tachypnea or respiratory distress.     Breath sounds: Normal breath sounds. No decreased breath sounds, wheezing, rhonchi or rales.  Chest:     Chest wall: No tenderness.  Abdominal:     Palpations: Abdomen is soft.     Tenderness: There is no abdominal tenderness.  Musculoskeletal:        General: No swelling. Normal range of motion.      Cervical back: Normal range of motion and neck supple.     Right lower leg: No edema.     Left lower leg: No edema.  Skin:    General: Skin is warm and dry.     Capillary Refill: Capillary refill takes less than 2 seconds.     Coloration: Skin is not cyanotic or pale.  Neurological:     General: No focal deficit present.     Mental Status: She is alert and oriented to person, place, and time.  Psychiatric:        Mood and Affect: Mood normal.        Behavior: Behavior normal.     ED Results / Procedures / Treatments   Labs (all labs ordered are listed, but only abnormal results are displayed) Labs Reviewed  BASIC METABOLIC PANEL - Abnormal; Notable for the following components:      Result Value   Glucose, Bld 128 (*)    Creatinine, Ser 1.03 (*)    GFR, Estimated 56 (*)    All other components within normal limits  CBC  PROTIME-INR  LIPASE, BLOOD  HEPATIC FUNCTION PANEL  MAGNESIUM  TROPONIN I (HIGH SENSITIVITY)  TROPONIN I (HIGH SENSITIVITY)    EKG EKG Interpretation  Date/Time:  Monday May 26 2023 12:09:55 EDT Ventricular Rate:  59 PR Interval:  142 QRS Duration: 84 QT Interval:  421 QTC Calculation: 417 R Axis:   41 Text Interpretation: Sinus rhythm Anteroseptal infarct, age indeterminate Confirmed by Gloris Manchester [phone]) on 05/26/2023 12:13:42 PM  Radiology DG Chest 2 View  Result Date: 05/26/2023 CLINICAL DATA:  Chest pain EXAM: CHEST - 2 VIEW COMPARISON:  Chest x-ray dated May 08, 2020 FINDINGS: The heart size and mediastinal contours are within normal limits. Both lungs are clear. The visualized skeletal structures are unremarkable. IMPRESSION: No active cardiopulmonary disease. Electronically Signed   By: Allegra Lai M.D.   On: 05/26/2023 12:51    Procedures Procedures    Medications Ordered in ED Medications  aspirin chewable tablet 324 mg (324 mg Oral Given 05/26/23 1244)    ED Course/ Medical Decision Making/ A&P                              Medical Decision Making Amount and/or Complexity of Data Reviewed Labs: ordered. Radiology: ordered.  Risk OTC drugs.   This patient presents to the ED for concern of chest pain, this involves an extensive number of treatment options, and is a complaint that carries with it a high risk of complications and morbidity.  The differential diagnosis includes ACS, pericarditis, costochondritis, occult injury,  muscle strain   Co morbidities that complicate the patient evaluation  HTN, HLD, CAD, IBS, GERD, DM, arthritis   Additional history obtained:  Additional history obtained from an N/A External records from outside source obtained and reviewed including EMR   Lab Tests:  I Ordered, and personally interpreted labs.  The pertinent results include: Normal electrolytes, normal lipase, normal hepatobiliary enzymes, normal troponins x 2   Imaging Studies ordered:  I ordered imaging studies including chest x-ray I independently visualized and interpreted imaging which showed no acute findings I agree with the radiologist interpretation   Cardiac Monitoring: / EKG:  The patient was maintained on a cardiac monitor.  I personally viewed and interpreted the cardiac monitored which showed an underlying rhythm of: Sinus rhythm  Problem List / ED Course / Critical interventions / Medication management  Patient presenting for intermittent chest pain since yesterday evening.  Currently, pain is resolved.  EKG shows new T wave abnormalities in precordial leads.  324 ASA was ordered.  Patient was kept on bedside cardiac monitor.  Workup was initiated.  Patient remained pain-free and symptom-free while in the ED.  Workup is reassuring with normal troponins x 2.  She does feel comfortable with discharge home.  Cardiology referral was ordered so patient can reestablish care.  She was discharged in stable condition.   Social Determinants of Health:  Has access to outpatient  care        Final Clinical Impression(s) / ED Diagnoses Final diagnoses:  Chest pain, unspecified type    Rx / DC Orders ED Discharge Orders          Ordered    Ambulatory referral to Cardiology       Comments: If you have not heard from the Cardiology office within the next 72 hours please call [phone].   05/26/23 1527              Gloris Manchester, MD 05/26/23 [phone]

## 2023-05-26 NOTE — ED Notes (Signed)
Patient denies pain and is resting comfortably. States she is ready to go home now and eat.

## 2023-05-31 ENCOUNTER — Ambulatory Visit
Admission: EM | Admit: 2023-05-31 | Discharge: 2023-05-31 | Disposition: A | Discharge status: Home / Self Care | Payer: Medicare PPO | Attending: Family Medicine | Admitting: Family Medicine

## 2023-05-31 DIAGNOSIS — N760 Acute vaginitis: Secondary | ICD-10-CM | POA: Insufficient documentation

## 2023-05-31 DIAGNOSIS — N390 Urinary tract infection, site not specified: Secondary | ICD-10-CM | POA: Diagnosis not present

## 2023-05-31 LAB — POCT URINALYSIS DIP (MANUAL ENTRY)
Bilirubin, UA: NEGATIVE
Glucose, UA: 500 mg/dL — AB
Ketones, POC UA: NEGATIVE mg/dL
Nitrite, UA: POSITIVE — AB
Protein Ur, POC: NEGATIVE mg/dL
Spec Grav, UA: 1.02 (ref 1.010–1.025)
Urobilinogen, UA: 0.2 E.U./dL
pH, UA: 5.5 (ref 5.0–8.0)

## 2023-05-31 MED ORDER — FLUCONAZOLE 150 MG PO TABS: 150.0000 mg | ORAL_TABLET | ORAL | 0 refills | Status: DC

## 2023-05-31 MED ORDER — CEPHALEXIN 500 MG PO CAPS: 500.0000 mg | ORAL_CAPSULE | Freq: Two times a day (BID) | ORAL | 0 refills | Status: DC

## 2023-05-31 NOTE — Discharge Instructions (Signed)
Your urinalysis today shows a urinary tract infection.  I have sent over an antibiotic and we have sent your specimen out for a culture to make sure this is the right medication to treat your specific infection.  If we need to make any changes based on this, someone will call you to make these changes.  We have also sent out a vaginal swab to see if you may have a yeast or bacterial infection causing her itching.  In the meantime, we will cover for a yeast infection and again call you if we need to make any changes.  You may take probiotics, drink plenty of fluids, take some Azo if you are still having burning sensation.

## 2023-05-31 NOTE — ED Triage Notes (Signed)
Pt reports she has vaginal itching, lower abdominal area, burning with urination\ x 2 days

## 2023-05-31 NOTE — ED Provider Notes (Signed)
[facility]    CSN: [id number] Arrival date & time: 05/31/23  1124      History   Chief Complaint No chief complaint on file.   HPI [name] is a 77 y.o. female.   Patient presenting today with 2-day history of lower abdominal pressure, dysuria, vaginal itching and irritation.  Denies flank pain, hematuria, fever, chills, vomiting, concern for STIs.  So far not tried anything over-the-counter for symptoms.    Past Medical History:  Diagnosis Date   Arthritis    CAD (coronary artery disease)    Cancer (HCC)    Diabetes mellitus (HCC)    type 2 x  1 or 2 yrs   Fatty liver    GERD (gastroesophageal reflux disease)    High cholesterol    Hypertension    Seasonal allergies     Patient Active Problem List   Diagnosis Date Noted   Nausea without vomiting 02/24/2023   TIA (transient ischemic attack) 11/29/2021   Renal mass 03/27/2020   Abnormal abdominal CT scan 01/10/2020   Elevated liver enzymes 05/13/2013   IBS (irritable bowel syndrome) 10/22/2012   Right sided abdominal pain 05/27/2012   Hypertension 05/27/2012   High cholesterol 05/27/2012   CAD (coronary artery disease) 05/27/2012    Past Surgical History:  Procedure Laterality Date   CARDIAC CATHETERIZATION  09/11/2007   100% occlusion of the left circumflex. proceed with stenting   CARDIAC CATHETERIZATION  09/11/2007   mid circumflex occlusion with tandem 80% proximal LAD lesion to the 50% mid LAD lesion. nonDES stenting of the circumflex occlusion after recanalization and balloon dilatation, the stent was a 2.75x98mm Liberte, TIMI3 flow was restored   CARDIOVASCULAR STRESS TEST  03/24/2012   normal pattern of perfusion in all regions, no scintigraphic evidence of inducible myocardial ischemia   CHOLECYSTECTOMY  2008   COLONOSCOPY  06/17/2012   Procedure: COLONOSCOPY;  Surgeon: Malissa Hippo, MD;  [facility]: [facility];  Service: Endoscopy;  Laterality: N/A;  100   COLONOSCOPY WITH  PROPOFOL N/A 01/14/2020   Procedure: COLONOSCOPY WITH PROPOFOL;  Surgeon: Malissa Hippo, MD;  Location: [facility];  Service: Endoscopy;  Laterality: N/A;  135   DOPPLER ECHOCARDIOGRAPHY  07/20/2012   EF 60-65%, wall motion normal, there were no regional wall motion abnormalities   Left breast lumpectomy     LOWER EXTREMITY VENOUS DOPPLER Left 12/27/2011   no evidence of deep vein thrombosis involving the left lower extremity and right common femoral vein, enlarged inguinal lymph node is visualized on the left. no baker's cyst on the left.   PARTIAL HYSTERECTOMY      OB History   No obstetric history on file.      Home Medications    Prior to Admission medications   Medication Sig Start Date End Date Taking? Authorizing Provider  cephALEXin (KEFLEX) 500 MG capsule Take 1 capsule (500 mg total) by mouth 2 (two) times daily. 05/31/23  Yes Particia Nearing, PA-C  fluconazole (DIFLUCAN) 150 MG tablet Take 1 tablet (150 mg total) by mouth once a week. 05/31/23  Yes Particia Nearing, PA-C  amLODipine (NORVASC) 10 MG tablet Take 1 tablet (10 mg total) by mouth daily. 12/01/21 02/24/23  Sherryll Burger, Pratik D, DO  aspirin EC 81 MG tablet Take 81 mg by mouth every morning.    [provider]  CALCIUM CARBONATE ANTACID PO Take 1 tablet by mouth daily. Prn.    [provider]  Cholecalciferol (VITAMIN  D) 125 MCG (5000 UT) CAPS Take 1 tablet by mouth daily.    [provider]  clopidogrel (PLAVIX) 75 MG tablet Take 1 tablet (75 mg total) by mouth every morning. 11/18/13   Hilty, Lisette Abu, MD  empagliflozin (JARDIANCE) 25 MG TABS tablet Take 25 mg by mouth daily.    [provider]  JANUMET XR [phone] MG TB24 Take 1 tablet by mouth daily. 09/19/21   [provider]  lisinopril (PRINIVIL,ZESTRIL) 20 MG tablet Take 20 mg by mouth daily. 06/19/17   [provider]  nitroGLYCERIN (NITROSTAT) 0.4 MG SL tablet Place 1 tablet (0.4 mg total) under  the tongue every 5 (five) minutes as needed for chest pain. 07/15/17 02/24/23  Laqueta Linden, MD  Polyethyl Glycol-Propyl Glycol (SYSTANE OP) Apply 1 drop to eye 2 (two) times daily.     [provider]  rosuvastatin (CRESTOR) 10 MG tablet Take 10 mg by mouth daily.  05/22/17   [provider]    Family History Family History  Problem Relation Age of Onset   Heart disease Mother    Cancer Mother        Breast cancer    Social History Social History   Tobacco Use   Smoking status: Never    Passive exposure: Never   Smokeless tobacco: Never  Vaping Use   Vaping Use: Never used  Substance Use Topics   Alcohol use: No   Drug use: No     Allergies   Patient has no known allergies.   Review of Systems Review of Systems Per HPI  Physical Exam Triage Vital Signs ED Triage Vitals  Enc Vitals Group     BP 05/31/23 1131 134/73     Pulse Rate 05/31/23 1131 (!) 57     Resp 05/31/23 1131 16     Temp 05/31/23 1131 98.1 F (36.7 C)     Temp Source 05/31/23 1131 Oral     SpO2 05/31/23 1131 98 %     Weight --      Height --      Head Circumference --      Peak Flow --      Pain Score 05/31/23 1132 5     Pain Loc --      Pain Edu? --      Excl. in GC? --    No data found.  Updated Vital Signs BP 134/73 (BP Location: Right Arm)   Pulse (!) 57   Temp 98.1 F (36.7 C) (Oral)   Resp 16   SpO2 98%   Visual Acuity Right Eye Distance:   Left Eye Distance:   Bilateral Distance:    Right Eye Near:   Left Eye Near:    Bilateral Near:     Physical Exam Vitals and nursing note reviewed.  Constitutional:      Appearance: Normal appearance. She is not ill-appearing.  HENT:     Head: Atraumatic.     Mouth/Throat:     Mouth: Mucous membranes are moist.  Eyes:     Extraocular Movements: Extraocular movements intact.     Conjunctiva/sclera: Conjunctivae normal.  Cardiovascular:     Rate and Rhythm: Normal rate and regular rhythm.     Heart  sounds: Normal heart sounds.  Pulmonary:     Effort: Pulmonary effort is normal.     Breath sounds: Normal breath sounds.  Abdominal:     General: Bowel sounds are normal. There is no distension.  Palpations: Abdomen is soft.     Tenderness: There is no abdominal tenderness. There is no right CVA tenderness, left CVA tenderness or guarding.  Genitourinary:    Comments: GU exam deferred, self swab performed Musculoskeletal:        General: Normal range of motion.     Cervical back: Normal range of motion and neck supple.  Skin:    General: Skin is warm and dry.  Neurological:     Mental Status: She is alert and oriented to person, place, and time.  Psychiatric:        Mood and Affect: Mood normal.        Thought Content: Thought content normal.        Judgment: Judgment normal.    UC Treatments / Results  Labs (all labs ordered are listed, but only abnormal results are displayed) Labs Reviewed  POCT URINALYSIS DIP (MANUAL ENTRY) - Abnormal; Notable for the following components:      Result Value   Clarity, UA cloudy (*)    Glucose, UA =500 (*)    Blood, UA trace-intact (*)    Nitrite, UA Positive (*)    Leukocytes, UA Small (1+) (*)    All other components within normal limits  URINE CULTURE  CERVICOVAGINAL ANCILLARY ONLY    EKG   Radiology No results found.  Procedures Procedures (including critical care time)  Medications Ordered in UC Medications - No data to display  Initial Impression / Assessment and Plan / UC Course  I have reviewed the triage vital signs and the nursing notes.  Pertinent labs & imaging results that were available during my care of the patient were reviewed by me and considered in my medical decision making (see chart for details).     Vitals and exam reassuring, urinalysis with evidence of a urinary tract infection.  Urine culture pending, treat with antibiotics while awaiting results and adjust if needed.  Will also treat itching  with Diflucan while awaiting vaginal swab.  Adjust if needed.  Discussed probiotics, fluids and other supportive measures.  Final Clinical Impressions(s) / UC Diagnoses   Final diagnoses:  Acute vaginitis  Acute lower UTI     Discharge Instructions      Your urinalysis today shows a urinary tract infection.  I have sent over an antibiotic and we have sent your specimen out for a culture to make sure this is the right medication to treat your specific infection.  If we need to make any changes based on this, someone will call you to make these changes.  We have also sent out a vaginal swab to see if you may have a yeast or bacterial infection causing her itching.  In the meantime, we will cover for a yeast infection and again call you if we need to make any changes.  You may take probiotics, drink plenty of fluids, take some Azo if you are still having burning sensation.     ED Prescriptions     Medication Sig Dispense Auth. Provider   cephALEXin (KEFLEX) 500 MG capsule Take 1 capsule (500 mg total) by mouth 2 (two) times daily. 10 capsule Particia Nearing, PA-C   fluconazole (DIFLUCAN) 150 MG tablet Take 1 tablet (150 mg total) by mouth once a week. 2 tablet Particia Nearing, [place]      PDMP not reviewed this encounter.   Particia Nearing, [place] 05/31/23 1219

## 2023-06-02 ENCOUNTER — Telehealth: Payer: Self-pay

## 2023-06-02 LAB — CERVICOVAGINAL ANCILLARY ONLY
Bacterial Vaginitis (gardnerella): NEGATIVE
Candida Glabrata: NEGATIVE
Candida Vaginitis: POSITIVE — AB
Comment: NEGATIVE
Comment: NEGATIVE
Comment: NEGATIVE

## 2023-06-03 ENCOUNTER — Ambulatory Visit: Payer: Medicare PPO | Attending: Internal Medicine | Admitting: Internal Medicine

## 2023-06-03 ENCOUNTER — Telehealth: Payer: Self-pay

## 2023-06-03 VITALS — BP 108/60 | HR 50 | Ht 65.0 in | Wt 154.6 lb

## 2023-06-03 DIAGNOSIS — R079 Chest pain, unspecified: Secondary | ICD-10-CM

## 2023-06-03 LAB — URINE CULTURE: Culture: >=100000 — AB

## 2023-06-03 NOTE — H&P (View-Only) (Signed)
Cardiology Office Note   Date:  06/03/2023   ID:  Cindy, [name] 1946/06/06, MRN [id number]  PCP:  Elfredia Nevins, MD  Cardiologist:   Dietrich Pates, MD   Pt presents for follow up of CAD    History of Present Illness: [name] is a 77 y.o. female with a history of CAD, HTN, DM Pt is s/p MI   Underwent PTCA /BMS of LCx in Oct 2008.  LAD with sequential 80% lesions and 60% RCA stenosis Echocardiogram  in August 2013 rLVEF was 60 to 65% moderate left ventricular hypertrophy and grade 1 diastolic dysfunction. 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   CALCIUM CARBONATE ANTACID PO Take 1 tablet by mouth daily. Prn.   cephALEXin (KEFLEX) 500 MG capsule Take 1 capsule (500 mg total) by mouth 2 (two) times daily.   Cholecalciferol (VITAMIN D) 125 MCG (5000 UT) CAPS Take 1 tablet by mouth daily.   clopidogrel (PLAVIX) 75 MG tablet Take 1 tablet (75 mg total) by mouth every morning.   empagliflozin (JARDIANCE) 25 MG TABS tablet Take 25 mg by mouth daily.   fluconazole (DIFLUCAN) 150 MG tablet Take 1 tablet (150 mg total) by mouth once a week.   JANUMET XR [phone] MG TB24 Take 1 tablet by mouth daily.   lisinopril (PRINIVIL,ZESTRIL) 20 MG tablet Take 20 mg by mouth daily.    Polyethyl Glycol-Propyl Glycol (SYSTANE OP) Apply 1 drop to eye 2 (two) times daily.    rosuvastatin (CRESTOR) 10 MG tablet Take 10 mg by mouth daily.      Allergies:   Patient has no known allergies.   Past Medical History:  Diagnosis Date   Arthritis    CAD (coronary artery disease)    Cancer (HCC)    Diabetes mellitus (HCC)    type 2 x  1 or 2 yrs   Fatty liver    GERD (gastroesophageal reflux disease)    High cholesterol    Hypertension    Seasonal allergies     Past Surgical History:  Procedure Laterality Date   CARDIAC CATHETERIZATION  09/11/2007   100% occlusion of the left circumflex. proceed with stenting   CARDIAC CATHETERIZATION  09/11/2007   mid circumflex occlusion with tandem 80% proximal LAD lesion to the 50% mid LAD lesion. nonDES stenting of the circumflex occlusion after recanalization and balloon dilatation, the stent was a 2.75x71mm Liberte, TIMI3 flow was  restored   CARDIOVASCULAR STRESS TEST  03/24/2012   normal pattern of perfusion in all regions, no scintigraphic evidence of inducible myocardial ischemia   CHOLECYSTECTOMY  2008   COLONOSCOPY  06/17/2012   Procedure: COLONOSCOPY;  Surgeon: Malissa Hippo, MD;  [facility];  Service: Endoscopy;  Laterality: N/A;  100   COLONOSCOPY WITH PROPOFOL N/A 01/14/2020   Procedure: COLONOSCOPY WITH PROPOFOL;  Surgeon: Malissa Hippo, MD;  [facility];  Service: Endoscopy;  Laterality: N/A;  135   DOPPLER ECHOCARDIOGRAPHY  07/20/2012   EF 60-65%, wall motion normal, there were no regional wall motion abnormalities   Left breast lumpectomy     LOWER EXTREMITY VENOUS DOPPLER Left 12/27/2011   no evidence of deep vein thrombosis involving the left lower extremity and right common femoral vein, enlarged inguinal lymph node is visualized on the left. no baker's cyst on the left.   PARTIAL HYSTERECTOMY       Social History:  The patient  reports that she has never smoked. She has never been  exposed to tobacco smoke. She has never used smokeless tobacco. She reports that she does not drink alcohol and does not use drugs.   Family History:  The patient's family history includes Cancer in her mother; Heart disease in her mother.    ROS:  Please see the history of present illness. All other systems are reviewed and  Negative to the above problem except as noted.    PHYSICAL EXAM: VS:  BP 108/60 (BP Location: Left Arm, Patient Position: Sitting, Cuff Size: Normal)   Pulse (!) 50   Ht 5\' 5"  (1.651 m)   Wt 154 lb 9.6 oz (70.1 kg)   SpO2 98%   BMI 25.73 kg/m   GEN: Well nourished, well developed, in no acute distress  HEENT: normal  Neck: no JVD, carotid bruit Cardiac: RRR; no murmur  No LE edema  Respiratory:  clear to auscultation bilaterally,  GI: soft, nontender   No hepatomegaly   EKG:  EKG is not ordered today.   Lipid Panel    Component Value Date/Time   CHOL 108 11/30/2021 0430   TRIG 56 11/30/2021 0430   HDL 38 (L) 11/30/2021 0430   CHOLHDL 2.8 11/30/2021 0430   VLDL 11 11/30/2021 0430   LDLCALC 59 11/30/2021 0430      Wt Readings from Last 3 Encounters:  06/03/23 154 lb 9.6 oz (70.1 kg)  05/26/23 157 lb (71.2 kg)  02/24/23 160 lb 3.2 oz (72.7 kg)      ASSESSMENT AND PLAN:  1   CAD  Pt with remote intervention   Has intermitt pains under L breast   Not as bad as MI   Rel stable except for spell that occurred earlier in June    ER visit    Sent home  I would recomm a [employer] on same meds      May back off on ASA to Plavix only   Wait until report back  Take activity as tolerated  2  HLD  LDL 76  HDL 58  Tri 110     3  DM   Hbg A1C is 8.7   that was in Sept 2023   Her meds have been adjusted   Will need to be followed  Pt says now her CBGs are in 90s to 100s    Tentative follow up 6 months    Current medicines are reviewed  at length with the patient today.  The patient does not have concerns regarding  medicines.  Signed, Dietrich Pates, MD  06/03/2023 6:12 PM    [facility] [address] [place], [place], [place]  [address] Phone: [phone]; Fax: [phone]

## 2023-06-03 NOTE — Patient Instructions (Signed)
Medication Instructions:  Your physician recommends that you continue on your current medications as directed. Please refer to the Current Medication list given to you today.   Labwork: None today  Testing/Procedures: Your physician has requested that you have a lexiscan myoview. For further information please visit [email or web address] Please follow instruction sheet, as given.   Follow-Up: October Dr.Ross  Any Other Special Instructions Will Be Listed Below (If Applicable).  If you need a refill on your cardiac medications before your next appointment, please call your pharmacy.

## 2023-06-03 NOTE — Telephone Encounter (Signed)
Transition Care Management Unsuccessful Follow-up Telephone Call  Date of discharge and from where:  [facility]  Attempts:  1st  Reason for unsuccessful TCM follow-up call:  Left voice message   Lenard Forth [facility] Guide, [facility] [phone] [address] [place], [place], [place] [address] Phone: [phone] Email: [email or web address]

## 2023-06-03 NOTE — Progress Notes (Addendum)
Cardiology Office Note   Date:  06/03/2023   ID:  [name], [name] 1946/06/06, MRN [id number]  PCP:  Elfredia Nevins, MD  Cardiologist:   Dietrich Pates, MD   Pt presents for follow up of CAD    History of Present Illness: [name] is a 77 y.o. female with a history of CAD, HTN, DM Pt is s/p MI   Underwent PTCA /BMS of LCx in Oct 2008.  LAD with sequential 80% lesions and 60% RCA stenosis Echocardiogram  in August 2013 rLVEF was 60 to 65% moderate left ventricular hypertrophy and grade 1 diastolic dysfunction. 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   CALCIUM CARBONATE ANTACID PO Take 1 tablet by mouth daily. Prn.   cephALEXin (KEFLEX) 500 MG capsule Take 1 capsule (500 mg total) by mouth 2 (two) times daily.   Cholecalciferol (VITAMIN D) 125 MCG (5000 UT) CAPS Take 1 tablet by mouth daily.   clopidogrel (PLAVIX) 75 MG tablet Take 1 tablet (75 mg total) by mouth every morning.   empagliflozin (JARDIANCE) 25 MG TABS tablet Take 25 mg by mouth daily.   fluconazole (DIFLUCAN) 150 MG tablet Take 1 tablet (150 mg total) by mouth once a week.   JANUMET XR [phone] MG TB24 Take 1 tablet by mouth daily.   lisinopril (PRINIVIL,ZESTRIL) 20 MG tablet Take 20 mg by mouth daily.    Polyethyl Glycol-Propyl Glycol (SYSTANE OP) Apply 1 drop to eye 2 (two) times daily.    rosuvastatin (CRESTOR) 10 MG tablet Take 10 mg by mouth daily.      Allergies:   Patient has no known allergies.   Past Medical History:  Diagnosis Date   Arthritis    CAD (coronary artery disease)    Cancer (HCC)    Diabetes mellitus (HCC)    type 2 x  1 or 2 yrs   Fatty liver    GERD (gastroesophageal reflux disease)    High cholesterol    Hypertension    Seasonal allergies     Past Surgical History:  Procedure Laterality Date   CARDIAC CATHETERIZATION  09/11/2007   100% occlusion of the left circumflex. proceed with stenting   CARDIAC CATHETERIZATION  09/11/2007   mid circumflex occlusion with tandem 80% proximal LAD lesion to the 50% mid LAD lesion. nonDES stenting of the circumflex occlusion after recanalization and balloon dilatation, the stent was a 2.75x71mm Liberte, TIMI3 flow was  restored   CARDIOVASCULAR STRESS TEST  03/24/2012   normal pattern of perfusion in all regions, no scintigraphic evidence of inducible myocardial ischemia   CHOLECYSTECTOMY  2008   COLONOSCOPY  06/17/2012   Procedure: COLONOSCOPY;  Surgeon: Malissa Hippo, MD;  [facility];  Service: Endoscopy;  Laterality: N/A;  100   COLONOSCOPY WITH PROPOFOL N/A 01/14/2020   Procedure: COLONOSCOPY WITH PROPOFOL;  Surgeon: Malissa Hippo, MD;  Location: [facility];  Service: Endoscopy;  Laterality: N/A;  135   DOPPLER ECHOCARDIOGRAPHY  07/20/2012   EF 60-65%, wall motion normal, there were no regional wall motion abnormalities   Left breast lumpectomy     LOWER EXTREMITY VENOUS DOPPLER Left 12/27/2011   no evidence of deep vein thrombosis involving the left lower extremity and right common femoral vein, enlarged inguinal lymph node is visualized on the left. no baker's cyst on the left.   PARTIAL HYSTERECTOMY       Social History:  The patient  reports that she has never smoked. She has never been  exposed to tobacco smoke. She has never used smokeless tobacco. She reports that she does not drink alcohol and does not use drugs.   Family History:  The patient's family history includes Cancer in her mother; Heart disease in her mother.    ROS:  Please see the history of present illness. All other systems are reviewed and  Negative to the above problem except as noted.    PHYSICAL EXAM: VS:  BP 108/60 (BP Location: Left Arm, Patient Position: Sitting, Cuff Size: Normal)   Pulse (!) 50   Ht 5\' 5"  (1.651 m)   Wt 154 lb 9.6 oz (70.1 kg)   SpO2 98%   BMI 25.73 kg/m   GEN: Well nourished, well developed, in no acute distress  HEENT: normal  Neck: no JVD, carotid bruit Cardiac: RRR; no murmur  No LE edema  Respiratory:  clear to auscultation bilaterally,  GI: soft, nontender   No hepatomegaly   EKG:  EKG is not ordered today.   Lipid Panel    Component Value Date/Time   CHOL 108 11/30/2021 0430   TRIG 56 11/30/2021 0430   HDL 38 (L) 11/30/2021 0430   CHOLHDL 2.8 11/30/2021 0430   VLDL 11 11/30/2021 0430   LDLCALC 59 11/30/2021 0430      Wt Readings from Last 3 Encounters:  06/03/23 154 lb 9.6 oz (70.1 kg)  05/26/23 157 lb (71.2 kg)  02/24/23 160 lb 3.2 oz (72.7 kg)      ASSESSMENT AND PLAN:  1   CAD  Pt with remote intervention   Has intermitt pains under L breast   Not as bad as MI   Rel stable except for spell that occurred earlier in June    ER visit    Sent home  I would recomm a [employer] on same meds      May back off on ASA to Plavix only   Wait until report back  Take activity as tolerated  2  HLD  LDL 76  HDL 58  Tri 110     3  DM   Hbg A1C is 8.7   that was in Sept 2023   Her meds have been adjusted   Will need to be followed  Pt says now her CBGs are in 90s to 100s    Tentative follow up 6 months    Current medicines are reviewed  at length with the patient today.  The patient does not have concerns regarding  medicines.  Signed, Dietrich Pates, MD  06/03/2023 6:12 PM    [facility] [address] [place], [place], [place]  [address] Phone: [phone]; Fax: [phone]

## 2023-06-11 ENCOUNTER — Ambulatory Visit (HOSPITAL_COMMUNITY)
Admission: RE | Admit: 2023-06-11 | Discharge: 2023-06-11 | Disposition: A | Discharge status: Home / Self Care | Payer: Medicare PPO | Source: Ambulatory Visit | Attending: Internal Medicine | Admitting: Internal Medicine

## 2023-06-11 ENCOUNTER — Encounter (HOSPITAL_COMMUNITY)
Admission: RE | Admit: 2023-06-11 | Discharge: 2023-06-11 | Disposition: A | Discharge status: Home / Self Care | Payer: Medicare PPO | Source: Ambulatory Visit | Attending: Internal Medicine | Admitting: Internal Medicine

## 2023-06-11 ENCOUNTER — Encounter (HOSPITAL_COMMUNITY): Payer: Self-pay

## 2023-06-11 DIAGNOSIS — R079 Chest pain, unspecified: Secondary | ICD-10-CM | POA: Diagnosis not present

## 2023-06-11 LAB — NM MYOCAR MULTI W/SPECT W/WALL MOTION / EF
LV dias vol: 66 mL (ref 46–106)
LV sys vol: 21 mL
Nuc Stress EF: 67 %
Peak HR: 84 {beats}/min
RATE: 0.3
Rest HR: 48 {beats}/min
Rest Nuclear Isotope Dose: 10.6 mCi
SDS: 2
SRS: 15
SSS: 17
ST Depression (mm): 0 mm
Stress Nuclear Isotope Dose: 33 mCi
TID: 0.93

## 2023-06-11 MED ORDER — SODIUM CHLORIDE FLUSH 0.9 % IV SOLN
INTRAVENOUS | Status: AC
  Administered 2023-06-11: 10 mL via INTRAVENOUS
  Filled 2023-06-11: qty 10

## 2023-06-11 MED ORDER — TECHNETIUM TC 99M TETROFOSMIN IV KIT
30.0000 | PACK | Freq: Once | INTRAVENOUS | Status: AC | PRN
  Administered 2023-06-11: 33 via INTRAVENOUS

## 2023-06-11 MED ORDER — REGADENOSON 0.4 MG/5ML IV SOLN
INTRAVENOUS | Status: AC
  Administered 2023-06-11: 0.4 mg via INTRAVENOUS
  Filled 2023-06-11: qty 5

## 2023-06-11 MED ORDER — TECHNETIUM TC 99M TETROFOSMIN IV KIT
10.0000 | PACK | Freq: Once | INTRAVENOUS | Status: AC | PRN
  Administered 2023-06-11: 10.6 via INTRAVENOUS

## 2023-06-12 ENCOUNTER — Ambulatory Visit (INDEPENDENT_AMBULATORY_CARE_PROVIDER_SITE_OTHER): Payer: Medicare PPO | Admitting: Gastroenterology

## 2023-06-17 ENCOUNTER — Telehealth: Payer: Self-pay | Admitting: *Deleted

## 2023-06-17 NOTE — Telephone Encounter (Signed)
Cardiac Catheterization scheduled at [facility] for: Wednesday June 18, 2023 8:30 AM Arrival time [facility] Main Entrance A at: 6:30 AM  Nothing to eat after midnight prior to procedure, clear liquids until 5 AM day of procedure.  Medication instructions: -Hold:  Janumet-day of procedure and 48 hours post procedure  Jardiance -AM of procedure  Lisinopril-day before and day of procedure -per protocol GFR 56-pt will hold AM of procedure -Other usual morning medications can be taken with sips of water including aspirin 81 mg and Plavix 75 mg  Confirmed patient has responsible adult to drive home post procedure and be with patient first 24 hours after arriving home.  Plan to go home the same day, you will only stay overnight if medically necessary.  Reviewed procedure instructions with patient.

## 2023-06-18 ENCOUNTER — Ambulatory Visit (HOSPITAL_COMMUNITY)
Admission: RE | Admit: 2023-06-18 | Discharge: 2023-06-18 | Disposition: A | Discharge status: Home / Self Care | Payer: Medicare PPO | Attending: Cardiology | Admitting: Cardiology

## 2023-06-18 ENCOUNTER — Other Ambulatory Visit (HOSPITAL_COMMUNITY): Payer: Self-pay

## 2023-06-18 ENCOUNTER — Other Ambulatory Visit: Payer: Self-pay

## 2023-06-18 ENCOUNTER — Telehealth: Payer: Self-pay | Admitting: Cardiology

## 2023-06-18 ENCOUNTER — Encounter (HOSPITAL_COMMUNITY)
Admission: RE | Disposition: A | Discharge status: Home / Self Care | Payer: Self-pay | Source: Home / Self Care | Attending: Cardiology

## 2023-06-18 DIAGNOSIS — I10 Essential (primary) hypertension: Secondary | ICD-10-CM | POA: Diagnosis not present

## 2023-06-18 DIAGNOSIS — I252 Old myocardial infarction: Secondary | ICD-10-CM | POA: Insufficient documentation

## 2023-06-18 DIAGNOSIS — I2510 Atherosclerotic heart disease of native coronary artery without angina pectoris: Secondary | ICD-10-CM

## 2023-06-18 DIAGNOSIS — E785 Hyperlipidemia, unspecified: Secondary | ICD-10-CM | POA: Insufficient documentation

## 2023-06-18 DIAGNOSIS — R0609 Other forms of dyspnea: Secondary | ICD-10-CM | POA: Diagnosis not present

## 2023-06-18 DIAGNOSIS — Z7902 Long term (current) use of antithrombotics/antiplatelets: Secondary | ICD-10-CM | POA: Diagnosis not present

## 2023-06-18 DIAGNOSIS — Z955 Presence of coronary angioplasty implant and graft: Secondary | ICD-10-CM | POA: Diagnosis not present

## 2023-06-18 DIAGNOSIS — E119 Type 2 diabetes mellitus without complications: Secondary | ICD-10-CM | POA: Diagnosis not present

## 2023-06-18 DIAGNOSIS — Z79899 Other long term (current) drug therapy: Secondary | ICD-10-CM | POA: Insufficient documentation

## 2023-06-18 DIAGNOSIS — I25119 Atherosclerotic heart disease of native coronary artery with unspecified angina pectoris: Secondary | ICD-10-CM | POA: Insufficient documentation

## 2023-06-18 HISTORY — PX: RIGHT/LEFT HEART CATH AND CORONARY ANGIOGRAPHY: CATH118266

## 2023-06-18 HISTORY — PX: CORONARY STENT INTERVENTION: CATH118234

## 2023-06-18 LAB — POCT I-STAT EG7
Acid-base deficit: 1 mmol/L (ref 0.0–2.0)
Acid-base deficit: 1 mmol/L (ref 0.0–2.0)
Bicarbonate: 24.4 mmol/L (ref 20.0–28.0)
Bicarbonate: 24.6 mmol/L (ref 20.0–28.0)
Calcium, Ion: 1.22 mmol/L (ref 1.15–1.40)
Calcium, Ion: 1.23 mmol/L (ref 1.15–1.40)
HCT: 40 % (ref 36.0–46.0)
HCT: 40 % (ref 36.0–46.0)
Hemoglobin: 13.6 g/dL (ref 12.0–15.0)
Hemoglobin: 13.6 g/dL (ref 12.0–15.0)
O2 Saturation: 70 %
O2 Saturation: 71 %
Potassium: 3.9 mmol/L (ref 3.5–5.1)
Potassium: 3.9 mmol/L (ref 3.5–5.1)
Sodium: 141 mmol/L (ref 135–145)
Sodium: 141 mmol/L (ref 135–145)
TCO2: 26 mmol/L (ref 22–32)
TCO2: 26 mmol/L (ref 22–32)
pCO2, Ven: 43 mmHg — ABNORMAL LOW (ref 44–60)
pCO2, Ven: 43.3 mmHg — ABNORMAL LOW (ref 44–60)
pH, Ven: 7.362 (ref 7.25–7.43)
pH, Ven: 7.363 (ref 7.25–7.43)
pO2, Ven: 38 mmHg (ref 32–45)
pO2, Ven: 39 mmHg (ref 32–45)

## 2023-06-18 LAB — POCT ACTIVATED CLOTTING TIME
Activated Clotting Time: 336 seconds
Activated Clotting Time: 250 seconds
Activated Clotting Time: 330 seconds

## 2023-06-18 LAB — GLUCOSE, CAPILLARY
Glucose-Capillary: 107 mg/dL — ABNORMAL HIGH (ref 70–99)
Glucose-Capillary: 107 mg/dL — ABNORMAL HIGH (ref 70–99)

## 2023-06-18 LAB — POCT I-STAT 7, (LYTES, BLD GAS, ICA,H+H)
Acid-base deficit: 2 mmol/L (ref 0.0–2.0)
Bicarbonate: 22.4 mmol/L (ref 20.0–28.0)
Calcium, Ion: 1.17 mmol/L (ref 1.15–1.40)
HCT: 39 % (ref 36.0–46.0)
Hemoglobin: 13.3 g/dL (ref 12.0–15.0)
O2 Saturation: 97 %
Potassium: 3.9 mmol/L (ref 3.5–5.1)
Sodium: 141 mmol/L (ref 135–145)
TCO2: 24 mmol/L (ref 22–32)
pCO2 arterial: 37.1 mmHg (ref 32–48)
pH, Arterial: 7.39 (ref 7.35–7.45)
pO2, Arterial: 92 mmHg (ref 83–108)

## 2023-06-18 SURGERY — RIGHT/LEFT HEART CATH AND CORONARY ANGIOGRAPHY
Anesthesia: LOCAL

## 2023-06-18 MED ORDER — LIDOCAINE HCL (PF) 1 % IJ SOLN
INTRAMUSCULAR | Status: AC
  Filled 2023-06-18: qty 30

## 2023-06-18 MED ORDER — HEPARIN SODIUM (PORCINE) 1000 UNIT/ML IJ SOLN
INTRAMUSCULAR | Status: DC | PRN
  Administered 2023-06-18: 3500 [IU] via INTRAVENOUS
  Administered 2023-06-18: 4000 [IU] via INTRAVENOUS
  Administered 2023-06-18: 2000 [IU] via INTRAVENOUS

## 2023-06-18 MED ORDER — CLOPIDOGREL BISULFATE 75 MG PO TABS: 75.0000 mg | ORAL_TABLET | ORAL | Status: DC

## 2023-06-18 MED ORDER — HEPARIN (PORCINE) IN NACL 1000-0.9 UT/500ML-% IV SOLN
INTRAVENOUS | Status: DC | PRN
  Administered 2023-06-18 (×3): 500 mL

## 2023-06-18 MED ORDER — SODIUM CHLORIDE 0.9% FLUSH: 3.0000 mL | Freq: Two times a day (BID) | INTRAVENOUS | Status: DC

## 2023-06-18 MED ORDER — SODIUM CHLORIDE 0.9% FLUSH: 3.0000 mL | INTRAVENOUS | Status: DC | PRN

## 2023-06-18 MED ORDER — SODIUM CHLORIDE 0.9 % WEIGHT BASED INFUSION: 1.0000 mL/kg/h | INTRAVENOUS | Status: DC

## 2023-06-18 MED ORDER — ONDANSETRON HCL 4 MG/2ML IJ SOLN: 4.0000 mg | Freq: Four times a day (QID) | INTRAMUSCULAR | Status: DC | PRN

## 2023-06-18 MED ORDER — LIDOCAINE HCL (PF) 1 % IJ SOLN
INTRAMUSCULAR | Status: DC | PRN
  Administered 2023-06-18: 2 mL via INTRADERMAL
  Administered 2023-06-18: 5 mL via INTRADERMAL

## 2023-06-18 MED ORDER — HEPARIN SODIUM (PORCINE) 1000 UNIT/ML IJ SOLN
INTRAMUSCULAR | Status: AC
  Filled 2023-06-18: qty 10

## 2023-06-18 MED ORDER — TICAGRELOR 90 MG PO TABS: 90.0000 mg | ORAL_TABLET | Freq: Two times a day (BID) | ORAL | Status: DC

## 2023-06-18 MED ORDER — TICAGRELOR 90 MG PO TABS
ORAL_TABLET | ORAL | Status: DC | PRN
  Administered 2023-06-18: 180 mg via ORAL

## 2023-06-18 MED ORDER — NITROGLYCERIN 1 MG/10 ML FOR IR/CATH LAB
INTRA_ARTERIAL | Status: DC | PRN
  Administered 2023-06-18: 200 ug via INTRACORONARY

## 2023-06-18 MED ORDER — SODIUM CHLORIDE 0.9 % WEIGHT BASED INFUSION
3.0000 mL/kg/h | INTRAVENOUS | Status: DC
  Administered 2023-06-18: 3 mL/kg/h via INTRAVENOUS

## 2023-06-18 MED ORDER — TICAGRELOR 90 MG PO TABS
ORAL_TABLET | ORAL | Status: AC
  Filled 2023-06-18: qty 2

## 2023-06-18 MED ORDER — ACETAMINOPHEN 325 MG PO TABS: 650.0000 mg | ORAL_TABLET | ORAL | Status: DC | PRN

## 2023-06-18 MED ORDER — ASPIRIN 81 MG PO CHEW: 81.0000 mg | CHEWABLE_TABLET | ORAL | Status: DC

## 2023-06-18 MED ORDER — SODIUM CHLORIDE 0.9 % IV SOLN: 250.0000 mL | INTRAVENOUS | Status: DC | PRN

## 2023-06-18 MED ORDER — IOHEXOL 350 MG/ML SOLN
INTRAVENOUS | Status: DC | PRN
  Administered 2023-06-18: 209 mL

## 2023-06-18 MED ORDER — LABETALOL HCL 5 MG/ML IV SOLN: 10.0000 mg | INTRAVENOUS | Status: DC | PRN

## 2023-06-18 MED ORDER — TICAGRELOR 90 MG PO TABS
90.0000 mg | ORAL_TABLET | Freq: Two times a day (BID) | ORAL | 2 refills | Status: DC
  Filled 2023-06-18: qty 180, 90d supply, fill #0

## 2023-06-18 MED ORDER — HYDRALAZINE HCL 20 MG/ML IJ SOLN: 10.0000 mg | INTRAMUSCULAR | Status: DC | PRN

## 2023-06-18 MED ORDER — VERAPAMIL HCL 2.5 MG/ML IV SOLN
INTRAVENOUS | Status: AC
  Filled 2023-06-18: qty 2

## 2023-06-18 MED ORDER — VERAPAMIL HCL 2.5 MG/ML IV SOLN
INTRAVENOUS | Status: DC | PRN
  Administered 2023-06-18: 10 mL via INTRA_ARTERIAL

## 2023-06-18 MED ORDER — SODIUM CHLORIDE 0.9 % IV SOLN: INTRAVENOUS | Status: DC

## 2023-06-18 MED ORDER — NITROGLYCERIN 1 MG/10 ML FOR IR/CATH LAB
INTRA_ARTERIAL | Status: AC
  Filled 2023-06-18: qty 10

## 2023-06-18 SURGICAL SUPPLY — 28 items
BALLN EMERGE MR 2.0X12 (BALLOONS) ×1
BALLN SAPPHIRE 2.5X15 (BALLOONS) ×1
BALLOON EMERGE MR 2.0X12 (BALLOONS) IMPLANT
BALLOON SAPPHIRE 2.5X15 (BALLOONS) IMPLANT
CATH BALLN WEDGE 5F 110CM (CATHETERS) IMPLANT
CATH INFINITI AMBI 5FR TG (CATHETERS) IMPLANT
CATH VISTA GUIDE 6FR XB3 (CATHETERS) IMPLANT
CATH VISTA GUIDE 6FR XB3.5 (CATHETERS) IMPLANT
DEVICE RAD COMP TR BAND LRG (VASCULAR PRODUCTS) IMPLANT
GLIDESHEATH SLEND SS 6F .021 (SHEATH) IMPLANT
GUIDEWIRE INQWIRE 1.5J.035X260 (WIRE) IMPLANT
INQWIRE 1.5J .035X260CM (WIRE) ×1
KIT ENCORE 26 ADVANTAGE (KITS) IMPLANT
KIT HEART LEFT (KITS) ×2 IMPLANT
KIT SINGLE USE MANIFOLD (KITS) IMPLANT
PACK CARDIAC CATHETERIZATION (CUSTOM PROCEDURE TRAY) ×2 IMPLANT
SET ATX-X65L (MISCELLANEOUS) IMPLANT
SHEATH GLIDE SLENDER 4/5FR (SHEATH) IMPLANT
STENT ONYX FRONTIER 2.0X12 (Permanent Stent) IMPLANT
STENT ONYX FRONTIER 2.0X15 (Permanent Stent) IMPLANT
STENT SYNERGY XD 2.75X38 (Permanent Stent) IMPLANT
SYNERGY XD 2.75X38 (Permanent Stent) ×1 IMPLANT
SYR MEDRAD MARK 7 150ML (SYRINGE) ×2 IMPLANT
TRANSDUCER W/STOPCOCK (MISCELLANEOUS) ×2 IMPLANT
TUBING CIL FLEX 10 FLL-RA (TUBING) ×2 IMPLANT
WIRE ASAHI PROWATER 180CM (WIRE) IMPLANT
WIRE EMERALD 3MM-J .025X260CM (WIRE) IMPLANT
WIRE RUNTHROUGH IZANAI 014 180 (WIRE) IMPLANT

## 2023-06-18 NOTE — Discharge Summary (Signed)
Discharge Summary for Same Day PCI   Patient ID: [name] MRN: [id number]; DOB: 31-Jul-1946  Admit date: 06/18/2023 Discharge date: 06/18/2023  Primary Care Provider: Elfredia Nevins, MD  Primary Cardiologist: Cindy Pates, MD  Primary Electrophysiologist:  None   Discharge Diagnoses    Active Problems:   CAD (coronary artery disease)  Diagnostic Studies/Procedures    Cardiac Catheterization 06/18/2023:    CULPRIT LESION SEGMENT #1: Mid LAD-1 lesion is 99% stenosed. Mid LAD-2 lesion is 50% stenosed. Mid LAD-3 lesion is 90% stenosed.  TIMI-3 flow   A drug-eluting stent was successfully placed using a SYNERGY XD 2.75X38. ->  Deployed to 3.0 mm.  Post intervention, there is a 0% residual stenosis throughout the stented segment.  TIMI-3 flow preserved   Mid LAD to Dist LAD lesion is 25% stenosed.   LESION #2 mid 1st Diag-2 lesion is 90% stenosed. ->  TIMI-3 flow   A drug-eluting stent was successfully placed using a STENT ONYX FRONTIER 2.0X12.  Deployed to 2.2 mm.  Post intervention, there is a 0% residual stenosis.  TIMI-3 flow preserved   LESION #3: Proximal 1st Diag-1 lesion is 80% stenosed.   A second drug-eluting stent was successfully placed overlapping the more distal stent, using a STENT ONYX FRONTIER 2.0X15.  Deployed to 2.2 mm.  Post intervention, there is a 0% residual stenosis. TIMI-3 flow preserved   ---------------------------------------------   Ost Cx to Prox Cx lesion is 50% stenosed.   Previously placed Prox Cx stent of unknown type is  widely patent.   Prox RCA lesion is 50% stenosed.  Gram   ---------------------------------------------   Post intervention, there is a 0% residual stenosis.   LV end diastolic pressure is normal.   There is no aortic valve stenosis.   POST-CATH DIAGNOSES Multivessel disease with severe tandem lesions in both LAD and D1, moderate disease in the distal LAD, proximal LCx prior to stent and proximal RCA. Successful DES PCI of mid LAD  covering 99%, 50% and 90% stenoses with a single Synergy XD 2.75 mm x 38 mm deployed to 3.0 mm. Successful DES PCI of tandem 80 then 90% lesions in D1 with overlapping Onyx Frontier DES stents, 2.0 mm x 15 mm and 2.0 mm x 12 mm deployed to 2.2 mm Normal Right Heart Cath Pressures and Left Ventricular Pressures.   RECOMMENDATIONS In the absence of any other complications or medical issues, we expect the patient to be ready for discharge from an interventional cardiology perspective on 06/18/2023. Recommend uninterrupted dual antiplatelet therapy with Aspirin 81mg  daily and Ticagrelor 90mg  twice daily for a minimum of 6 months (stable ischemic heart disease-Class I recommendation). After 6 months, can stop aspirin and continue Brilinta monotherapy 60 mg twice daily or switch back to Plavix 75 mg daily.  Would continue lifelong monotherapy. Continue to titrate GDMT for CAD   Cindy Lemma, MD  Diagnostic Dominance: Right  Intervention     _____________   History of Present Illness     [name] is a 77 y.o. female with CAD (PTCA with bare-metal stent of left circumflex 10/08), hypertension, diabetes who was recently seen in the office on 7/2 with Dr. Tenny Craw and complained of intermittent pain under her left breast. It was recommended that she undergo outpatient lexiscan myoview.  Myoview completed on 7/10 with findings consistent with peri-infarct ischemia in the mid/apical anterior septal and inferior septal wall.  It was recommended that she undergo outpatient cardiac catheterization for further evaluation.  Hospital  Course     The patient underwent cardiac cath as noted above with successful PCI/DES of mid LAD with DES of tandem lesions in D1. Plan for DAPT with ASA/Brilinta for at least 6 months. The patient was seen by cardiac rehab while in short stay. There were no observed complications post cath. Radial cath site did have small hematoma which resolved with pressure. It was  re-evaluated prior to discharge and found to be stable without any complications. Instructions/precautions regarding cath site care were given prior to discharge.  [name] was seen by Dr. Herbie [name] and determined stable for discharge home. Follow up with our office has been arranged. Medications are listed below. Pertinent changes include addition of Brilinta (plavix stopped). Of note, patient is only on Crestor 10mg  daily, reports issues with Lipitor and elevated LFTs in the remote past. Lipid panel 11/2021, advised patient we should repeat in the office at follow up and can make a decision regarding adjustments if needed at that time.   _____________  Cath/PCI Registry Performance & Quality Measures: Aspirin prescribed? - Yes ADP Receptor Inhibitor (Plavix/Clopidogrel, Brilinta/Ticagrelor or Effient/Prasugrel) prescribed (includes medically managed patients)? - Yes High Intensity Statin (Lipitor 40-80mg  or Crestor 20-40mg ) prescribed? - No - as noted above For EF <40%, was ACEI/ARB prescribed? - Not Applicable (EF >/= 40%) For EF <40%, Aldosterone Antagonist (Spironolactone or Eplerenone) prescribed? - Not Applicable (EF >/= 40%) Cardiac Rehab Phase II ordered (Included Medically managed Patients)? - Yes  _____________   Discharge Vitals Blood pressure (!) 161/82, pulse (!) 54, temperature 97.9 F (36.6 C), temperature source Oral, resp. rate 18, height 5\' 5"  (1.651 m), weight 69.9 kg, SpO2 97%.  Filed Weights   06/18/23 0646  Weight: 69.9 kg    Last Labs & Radiologic Studies    CBC No results for input(s): "WBC", "NEUTROABS", "HGB", "HCT", "MCV", "PLT" in the last 72 hours. Basic Metabolic Panel No results for input(s): "NA", "K", "CL", "CO2", "GLUCOSE", "BUN", "CREATININE", "CALCIUM", "MG", "PHOS" in the last 72 hours. Liver Function Tests No results for input(s): "AST", "ALT", "ALKPHOS", "BILITOT", "PROT", "ALBUMIN" in the last 72 hours. No results for input(s): "LIPASE",  "AMYLASE" in the last 72 hours. High Sensitivity Troponin:   Recent Labs  Lab 05/26/23 1220 05/26/23 1401  TROPONINIHS 5 6    BNP Invalid input(s): "POCBNP" D-Dimer No results for input(s): "DDIMER" in the last 72 hours. Hemoglobin A1C No results for input(s): "HGBA1C" in the last 72 hours. Fasting Lipid Panel No results for input(s): "CHOL", "HDL", "LDLCALC", "TRIG", "CHOLHDL", "LDLDIRECT" in the last 72 hours. Thyroid Function Tests No results for input(s): "TSH", "T4TOTAL", "T3FREE", "THYROIDAB" in the last 72 hours.  Invalid input(s): "FREET3" _____________  CARDIAC CATHETERIZATION  Result Date: 06/18/2023   CULPRIT LESION SEGMENT #1: Mid LAD-1 lesion is 99% stenosed. Mid LAD-2 lesion is 50% stenosed. Mid LAD-3 lesion is 90% stenosed.  TIMI-3 flow   A drug-eluting stent was successfully placed using a SYNERGY XD 2.75X38. ->  Deployed to 3.0 mm.  Post intervention, there is a 0% residual stenosis throughout the stented segment.  TIMI-3 flow preserved   Mid LAD to Dist LAD lesion is 25% stenosed.   LESION #2 mid 1st Diag-2 lesion is 90% stenosed. ->  TIMI-3 flow   A drug-eluting stent was successfully placed using a STENT ONYX FRONTIER 2.0X12.  Deployed to 2.2 mm.  Post intervention, there is a 0% residual stenosis.  TIMI-3 flow preserved   LESION #3: Proximal 1st Diag-1 lesion is 80%  stenosed.   A second drug-eluting stent was successfully placed overlapping the more distal stent, using a STENT ONYX FRONTIER 2.0X15.  Deployed to 2.2 mm.  Post intervention, there is a 0% residual stenosis. TIMI-3 flow preserved   ---------------------------------------------   Ost Cx to Prox Cx lesion is 50% stenosed.   Previously placed Prox Cx stent of unknown type is  widely patent.   Prox RCA lesion is 50% stenosed.  Gram   ---------------------------------------------   Post intervention, there is a 0% residual stenosis.   LV end diastolic pressure is normal.   There is no aortic valve stenosis.  POST-CATH DIAGNOSES Multivessel disease with severe tandem lesions in both LAD and D1, moderate disease in the distal LAD, proximal LCx prior to stent and proximal RCA. Successful DES PCI of mid LAD covering 99%, 50% and 90% stenoses with a single Synergy XD 2.75 mm x 38 mm deployed to 3.0 mm. Successful DES PCI of tandem 80 then 90% lesions in D1 with overlapping Onyx Frontier DES stents, 2.0 mm x 15 mm and 2.0 mm x 12 mm deployed to 2.2 mm Normal Right Heart Cath Pressures and Left Ventricular Pressures. RECOMMENDATIONS In the absence of any other complications or medical issues, we expect the patient to be ready for discharge from an interventional cardiology perspective on 06/18/2023. Recommend uninterrupted dual antiplatelet therapy with Aspirin 81mg  daily and Ticagrelor 90mg  twice daily for a minimum of 6 months (stable ischemic heart disease-Class I recommendation). After 6 months, can stop aspirin and continue Brilinta monotherapy 60 mg twice daily or switch back to Plavix 75 mg daily.  Would continue lifelong monotherapy. Continue to titrate GDMT for CAD Cindy Lemma, MD  NM Myocar Multi W/Spect W/Wall Motion / EF  Result Date: 06/11/2023   Findings are consistent with infarction with peri-infarct ischemia. The study is intermediate risk.   No ST deviation was noted. The ECG was negative for ischemia.   LV perfusion is abnormal.  Moderate sized, moderate intensity, mid to apical anteroseptal and inferoseptal defects that are partially reversible and suggestive of infarct scar with moderate peri-infarct ischemia, although wall motion is normal on gated imaging.  Breast attenuation could also be an associated factor as well   Left ventricular function is normal. Nuclear stress EF: 67 %. Intermediate risk study suggestive of anteroseptal/inferoseptal infarct scar with moderate peri-infarct ischemia in the setting of breast attenuation.  Wall motion overall normal however with LVEF 67%.   DG Chest 2  View  Result Date: 05/26/2023 CLINICAL DATA:  Chest pain EXAM: CHEST - 2 VIEW COMPARISON:  Chest x-ray dated May 08, 2020 FINDINGS: The heart size and mediastinal contours are within normal limits. Both lungs are clear. The visualized skeletal structures are unremarkable. IMPRESSION: No active cardiopulmonary disease. Electronically Signed   By: Allegra Lai M.D.   On: 05/26/2023 12:51    Disposition   Pt is being discharged home today in good condition.  Follow-up Plans & Appointments     Follow-up Information     Ellsworth Lennox, PA-C Follow up on 07/03/2023.   Specialties: Cardiology, Cardiology Why: at 3:30pm for your follow up appt with cardiology Contact information: [address] [place] [address] [phone]                Discharge Instructions     Amb Referral to Cardiac Rehabilitation   Complete by: As directed    Diagnosis: Coronary Stents   After initial evaluation and assessments completed: Virtual Based Care  may be provided alone or in conjunction with Phase 2 Cardiac Rehab based on patient barriers.: Yes   Intensive Cardiac Rehabilitation (ICR) [facility] location only OR Traditional Cardiac Rehabilitation (TCR) *If criteria for ICR are not met will enroll in TCR [facility] only): Yes   clopidogrel (PLAVIX) tablet 75 mg   Complete by: As directed    ticagrelor (BRILINTA) tablet   Complete by: As directed    ticagrelor (BRILINTA) tablet   Complete by: As directed    ticagrelor (BRILINTA) tablet   Complete by: As directed         Discharge Medications   Allergies as of 06/18/2023   No Known Allergies      Medication List     STOP taking these medications    cephALEXin 500 MG capsule Commonly known as: KEFLEX   clopidogrel 75 MG tablet Commonly known as: PLAVIX   fluconazole 150 MG tablet Commonly known as: Diflucan       TAKE these medications    amLODipine 10 MG tablet Commonly known as: NORVASC Take 1 tablet (10 mg total) by  mouth daily. What changed: when to take this   aspirin EC 81 MG tablet Take 81 mg by mouth every morning.   Brilinta 90 MG Tabs tablet Generic drug: ticagrelor Take 1 tablet (90 mg total) by mouth 2 (two) times daily.   calcium elemental as carbonate 400 MG chewable tablet Commonly known as: BARIATRIC TUMS ULTRA Chew 1-2 tablets by mouth 2 (two) times daily as needed for indigestion or heartburn.   Janumet XR [id number] MG Tb24 Generic drug: SitaGLIPtin-MetFORMIN HCl Take 1 tablet by mouth in the morning.   Jardiance 25 MG Tabs tablet Generic drug: empagliflozin Take 25 mg by mouth in the morning.   lisinopril 20 MG tablet Commonly known as: ZESTRIL Take 20 mg by mouth in the morning.   nitroGLYCERIN 0.4 MG SL tablet Commonly known as: NITROSTAT Place 1 tablet (0.4 mg total) under the tongue every 5 (five) minutes as needed for chest pain.   rosuvastatin 10 MG tablet Commonly known as: CRESTOR Take 10 mg by mouth at bedtime.   SYSTANE OP Place 1 drop into both eyes 2 (two) times daily.   Vitamin D 125 MCG (5000 UT) Caps Take 1 tablet by mouth in the morning.        Allergies No Known Allergies  Outstanding Labs/Studies   FLP/LFTs at office visit   Duration of Discharge Encounter   Greater than 30 minutes including physician time.  Signed, Laverda Page, NP 06/18/2023, 5:00 PM

## 2023-06-18 NOTE — Telephone Encounter (Signed)
   Transition of Care Follow-up Phone Call Request    Patient Name: [name] Date of Birth: [birth year] Date of Encounter: 06/18/2023  Primary Care Provider:  Elfredia Nevins, MD Primary Cardiologist:  Dietrich Pates, MD  [name] has been scheduled for a transition of care follow up appointment with a [facility] provider:  Randall An 8/1  Please reach out to [name] within 48 hours to confirm appointment and review transition of care protocol questionnaire.  Laverda Page, NP  06/18/2023, 4:06 PM

## 2023-06-18 NOTE — Interval H&P Note (Signed)
History and Physical Interval Note:  06/18/2023 11:17 AM  [name]  has presented today for surgery, with the diagnosis of abnormal stress test.  The various methods of treatment have been discussed with the patient and family. After consideration of risks, benefits and other options for treatment, the patient has consented to  Procedure(s): RIGHT/LEFT HEART CATH AND CORONARY ANGIOGRAPHY (N/A)  PERCUTANEOUS CORONARY INTERVENTION   as a surgical intervention.  The patient's history has been reviewed, patient examined, no change in status, stable for surgery.  I have reviewed the patient's chart and labs.  Questions were answered to the patient's satisfaction.    Cath Lab Visit (complete for each Cath Lab visit)  Clinical Evaluation Leading to the Procedure:   ACS: No.  Non-ACS:    Anginal Classification: CCS II  Anti-ischemic medical therapy: Minimal Therapy (1 class of medications)  Non-Invasive Test Results: Intermediate-risk stress test findings: cardiac mortality 1-3%/year  Prior CABG: No previous CABG   Bryan Lemma

## 2023-06-18 NOTE — Discharge Instructions (Signed)
Drink plenty of fluids for 48 hours and keep wrist elevated at heart level for 24 hours  Drink plenty of fluids for 48 hours and keep wrist elevated at heart level for 24 hours  Radial Site Care   This sheet gives you information about how to care for yourself after your procedure. Your health care provider may also give you more specific instructions. If you have problems or questions, contact your health care provider. What can I expect after the procedure? After the procedure, it is common to have: Bruising and tenderness at the catheter insertion area. Follow these instructions at home: Medicines Take over-the-counter and prescription medicines only as told by your health care provider. Insertion site care Follow instructions from your health care provider about how to take care of your insertion site. Make sure you: Wash your hands with soap and water before you change your bandage (dressing). If soap and water are not available, use hand sanitizer. Remove your dressing as told by your health care provider. In 24 hours Check your insertion site every day for signs of infection. Check for: Redness, swelling, or pain. Fluid or blood. Pus or a bad smell. Warmth. Do not take baths, swim, or use a hot tub until your health care provider approves. You may shower 24-48 hours after the procedure, or as directed by your health care provider. Remove the dressing and gently wash the site with plain soap and water. Pat the area dry with a clean towel. Do not rub the site. That could cause bleeding. Do not apply powder or lotion to the site. Activity   For 24 hours after the procedure, or as directed by your health care provider: Do not flex or bend the affected arm. Do not push or pull heavy objects with the affected arm. Do not drive yourself home from the hospital or clinic. You may drive 24 hours after the procedure unless your health care provider tells you not to. Do not operate  machinery or power tools. Do not lift anything that is heavier than 10 lb (4.5 kg), or the limit that you are told, until your health care provider says that it is safe.  For 4 days Ask your health care provider when it is okay to: Return to work or school. Resume usual physical activities or sports. Resume sexual activity. General instructions If the catheter site starts to bleed, raise your arm and put firm pressure on the site. If the bleeding does not stop, get help right away. This is a medical emergency. If you went home on the same day as your procedure, a responsible adult should be with you for the first 24 hours after you arrive home. Keep all follow-up visits as told by your health care provider. This is important. Contact a health care provider if: You have a fever. You have redness, swelling, or yellow drainage around your insertion site. Get help right away if: You have unusual pain at the radial site. The catheter insertion area swells very fast. The insertion area is bleeding, and the bleeding does not stop when you hold steady pressure on the area. Your arm or hand becomes pale, cool, tingly, or numb. These symptoms may represent a serious problem that is an emergency. Do not wait to see if the symptoms will go away. Get medical help right away. Call your local emergency services (911 in the [place] Do not drive yourself to the hospital. Summary After the procedure, it is common to have bruising and  tenderness at the site. Follow instructions from your health care provider about how to take care of your radial site wound. Check the wound every day for signs of infection. Do not lift anything that is heavier than 10 lb (4.5 kg), or the limit that you are told, until your health care provider says that it is safe. This information is not intended to replace advice given to you by your health care provider. Make sure you discuss any questions you have with your health care  provider. Document Revised: 12/24/2017 Document Reviewed: 12/24/2017 Elsevier Patient Education  2020 [employer].

## 2023-06-18 NOTE — Progress Notes (Signed)
CARDIAC REHAB PHASE I   Post sent education including site care, restrictions, risk factors, exercise guidelines, NTG use, antiplatelet therapy importance, heart healthy diabetic diet and CRP2 reviewed. All questions and concerns addressed. Plan for home later today.   [id number]   Woodroe Chen, RN BSN 06/18/2023 2:45 PM

## 2023-06-19 ENCOUNTER — Encounter (HOSPITAL_COMMUNITY): Payer: Self-pay | Admitting: Cardiology

## 2023-06-19 NOTE — Telephone Encounter (Signed)
Patient contacted regarding discharge from [facility]. [facility] on 06/18/2023.  Patient understands to follow up with provider Randall An, PA-C on 07/03/2023 at 3:30 pm at [facility]. Patient understands discharge instructions? Yes Patient understands medications and regiment? Yes Patient understands to bring all medications to this visit? Yes  Ask patient:  Are you enrolled in My Chart No  If no ask patient if they would like to enroll. No

## 2023-06-20 ENCOUNTER — Telehealth: Payer: Self-pay | Admitting: Internal Medicine

## 2023-06-20 ENCOUNTER — Other Ambulatory Visit (HOSPITAL_COMMUNITY): Payer: Self-pay

## 2023-06-20 NOTE — Telephone Encounter (Signed)
I spoke with patient. She said she started Brilinta yesterday. Today when she urinated, she noted blood on tissue. She couldn't elaborate, just noted pink tinged blood from "down there, my private parts" I asked if she had had any blood with bowel movements and she said no. She was unsure if the blood was from her vagina or her bladder. She has not taken her am dose yet.  Please advise.

## 2023-06-20 NOTE — Telephone Encounter (Signed)
Pt c/o medication issue:  1. Name of Medication:   ticagrelor (BRILINTA) 90 MG TABS tablet    2. How are you currently taking this medication (dosage and times per day)?   Take 1 tablet (90 mg total) by mouth 2 (two) times daily.    3. Are you having a reaction (difficulty breathing--STAT)? No  4. What is your medication issue? Pt states that since taking medication after procedure on 7/17 she has started bleeding. She would like a callback regarding this matter as soon as possible. Please advise

## 2023-06-20 NOTE — Telephone Encounter (Signed)
Patient should continue with Brilinta. This is very important to keep her stent open. She should continue to monitor. If she has significant bleeding (turning toilet bowl red) she should seek medical attention.

## 2023-06-20 NOTE — Telephone Encounter (Signed)
I spoke with patient and relayed the message from the pharmacist. She will continue to monitor and verbalized understanding of the importance of continuing brilinta.

## 2023-07-03 ENCOUNTER — Encounter: Payer: Self-pay | Admitting: Student

## 2023-07-03 ENCOUNTER — Ambulatory Visit: Payer: Medicare PPO | Attending: Student | Admitting: Student

## 2023-07-03 VITALS — BP 128/58 | HR 59 | Ht 60.0 in | Wt 155.0 lb

## 2023-07-03 DIAGNOSIS — I2510 Atherosclerotic heart disease of native coronary artery without angina pectoris: Secondary | ICD-10-CM

## 2023-07-03 DIAGNOSIS — E785 Hyperlipidemia, unspecified: Secondary | ICD-10-CM

## 2023-07-03 DIAGNOSIS — N939 Abnormal uterine and vaginal bleeding, unspecified: Secondary | ICD-10-CM

## 2023-07-03 DIAGNOSIS — I10 Essential (primary) hypertension: Secondary | ICD-10-CM

## 2023-07-03 MED ORDER — ROSUVASTATIN CALCIUM 20 MG PO TABS: 20.0000 mg | ORAL_TABLET | Freq: Every day | ORAL | 3 refills | Status: DC

## 2023-07-03 NOTE — Patient Instructions (Addendum)
Medication Instructions:  INCREASE Crestor to 20 mg daily  Labwork: CBC now  In 2 months ( October)  Fasting Lipids and LFT's  Testing/Procedures: None today  Follow-Up: Keep Appointment with Dr.Ross on 09/18/23 at 3:40 pm  Any Other Special Instructions Will Be Listed Below (If Applicable).   You have been referred to GYN, they will call you to make an appointment   If you need a refill on your cardiac medications before your next appointment, please call your pharmacy.

## 2023-07-03 NOTE — Progress Notes (Signed)
Cardiology Office Note    Date:  07/03/2023  ID:  [name] March 05, 1946, MRN [id number] Cardiologist: Dietrich Pates, MD    History of Present Illness:    [name] is a 77 y.o. female with past medical history of CAD (s/p BMS to LCx in 2008), HTN, HLD and Type 2 DM who presents to the office today for follow-up from her recent cardiac catheterization.  She was examined by Dr. Tenny Craw in 06/2023 and reported episodes of chest discomfort under her left breast which would occur with exertion and improve with rest or nitroglycerin. Stress test was recommended for further evaluation and this showed anterior septal/inferior septal infarct scar with moderate peri-infarct ischemia in the setting of breast attenuation and was overall [name] intermediate-risk study. A cardiac catheterization was recommended for definitive evaluation. This was performed by Dr. Herbie Baltimore on 06/18/2023 and showed multivessel disease with severe tandem lesions in the LAD and D1 along with moderate disease along the distal LAD, proximal LCx and proximal RCA. She underwent DES placement to the mid-LAD and DES x 2 to D1. She was discharged the same day and it was recommended to continue uninterrupted DAPT with ASA and Brilinta for 6 months and after 6 months could stop ASA and continue Brilinta 60 mg daily as monotherapy or switch back to Plavix 75 mg daily. Was recommended to continue lifelong monotherapy. She was discharged the same day.  In talking with the patient today, she reports feeling much better since her recent catheterization. She denies any recurrent episodes of chest pain. Has not used SL NTG. Says that her breathing has been stable with no specific dyspnea on exertion, orthopnea, PND or pitting edema. She has experienced some pain and bruising along her right forearm cath site but says this is improving. Also reports having intermittent episodes of vaginal spotting which is new since starting Brilinta.  Reports a history of  hot flashes but no vaginal bleeding until she was started on this.   Studies Reviewed:   EKG: EKG is ordered today and demonstrates:   EKG Interpretation Date/Time:  Thursday July 03 2023 15:11:03 EDT Ventricular Rate:  59 PR Interval:  144 QRS Duration:  80 QT Interval:  352 QTC Calculation: 348 R Axis:   12  Text Interpretation: Sinus bradycardia with sinus arrhythmia Low voltage QRS Nonspecific T wave abnormality Confirmed by [name] ([id number]) on 07/03/2023 4:01:26 PM       NST: 06/2023   Findings are consistent with infarction with peri-infarct ischemia. The study is intermediate risk.   No ST deviation was noted. The ECG was negative for ischemia.   LV perfusion is abnormal.  Moderate sized, moderate intensity, mid to apical anteroseptal and inferoseptal defects that are partially reversible and suggestive of infarct scar with moderate peri-infarct ischemia, although wall motion is normal on gated imaging.  Breast attenuation could also be [name] associated factor as well   Left ventricular function is normal. Nuclear stress EF: 67 %.   Intermediate risk study suggestive of anteroseptal/inferoseptal infarct scar with moderate peri-infarct ischemia in the setting of breast attenuation.  Wall motion overall normal however with LVEF 67%.  R/LHC: 06/2023   CULPRIT LESION SEGMENT #1: Mid LAD-1 lesion is 99% stenosed. Mid LAD-2 lesion is 50% stenosed. Mid LAD-3 lesion is 90% stenosed.  TIMI-3 flow   A drug-eluting stent was successfully placed using a SYNERGY XD 2.75X38. ->  Deployed to 3.0 mm.  Post intervention, there is a 0% residual stenosis  throughout the stented segment.  TIMI-3 flow preserved   Mid LAD to Dist LAD lesion is 25% stenosed.   LESION #2 mid 1st Diag-2 lesion is 90% stenosed. ->  TIMI-3 flow   A drug-eluting stent was successfully placed using a STENT ONYX FRONTIER 2.0X12.  Deployed to 2.2 mm.  Post intervention, there is a 0% residual stenosis.  TIMI-3 flow  preserved   LESION #3: Proximal 1st Diag-1 lesion is 80% stenosed.   A second drug-eluting stent was successfully placed overlapping the more distal stent, using a STENT ONYX FRONTIER 2.0X15.  Deployed to 2.2 mm.  Post intervention, there is a 0% residual stenosis. TIMI-3 flow preserved   ---------------------------------------------   Ost Cx to Prox Cx lesion is 50% stenosed.   Previously placed Prox Cx stent of unknown type is  widely patent.   Prox RCA lesion is 50% stenosed.  Gram   ---------------------------------------------   Post intervention, there is a 0% residual stenosis.   LV end diastolic pressure is normal.   There is no aortic valve stenosis.   POST-CATH DIAGNOSES Multivessel disease with severe tandem lesions in both LAD and D1, moderate disease in the distal LAD, proximal LCx prior to stent and proximal RCA. Successful DES PCI of mid LAD covering 99%, 50% and 90% stenoses with a single Synergy XD 2.75 mm x 38 mm deployed to 3.0 mm. Successful DES PCI of tandem 80 then 90% lesions in D1 with overlapping Onyx Frontier DES stents, 2.0 mm x 15 mm and 2.0 mm x 12 mm deployed to 2.2 mm Complex intervention-2 long lesion segment with very long stent placed in the LAD and 2 overlapping shorter stents placed in the diagonal branch due to difficulty manipulating around the bend. Normal Right Heart Cath Pressures and Left Ventricular Pressures.   RECOMMENDATIONS In the absence of any other complications or medical issues, we expect the patient to be ready for discharge from [name] interventional cardiology perspective on 06/18/2023. Recommend uninterrupted dual antiplatelet therapy with Aspirin 81mg  daily and Ticagrelor 90mg  twice daily for a minimum of 6 months (stable ischemic heart disease-Class I recommendation). After 6 months, can stop aspirin and continue Brilinta monotherapy 60 mg twice daily or switch back to Plavix 75 mg daily.  Would continue lifelong monotherapy. Continue to  titrate GDMT for CAD    Physical Exam:   VS:  BP (!) 128/58   Pulse (!) 59   Ht 5' (1.524 m)   Wt 155 lb (70.3 kg)   SpO2 99%   BMI 30.27 kg/m    Wt Readings from Last 3 Encounters:  07/03/23 155 lb (70.3 kg)  06/18/23 154 lb (69.9 kg)  06/03/23 154 lb 9.6 oz (70.1 kg)     GEN: Well nourished, well developed female appearing in no acute distress NECK: No JVD; No carotid bruits CARDIAC: RRR, no murmurs, rubs, gallops RESPIRATORY:  Clear to auscultation without rales, wheezing or rhonchi  ABDOMEN: Appears non-distended. No obvious abdominal masses. EXTREMITIES: No clubbing or cyanosis. No pitting edema.  Distal pedal pulses are 2+ bilaterally.  Radial cath site with ecchymosis and small, firm hematoma but no evidence of a pseudoaneurysm.   Assessment and Plan:   1. CAD - She is s/p BMS to LCx in 2008 and recently underwent DES placement to the mid-LAD and DES x 2 to D1. She reports overall doing well since then and denies any recurrent chest pain. She is scheduled for orientation for cardiac rehab and would be able to start  this from a cardiac perspective.  - Continue current medical therapy with ASA 81 mg daily, Brilinta 90 mg twice daily, Lisinopril 20 mg daily and Crestor (with dose adjustment as discussed below).   2. HLD - Her LDL was at 76 in 01/2023. She is currently taking Crestor 10 mg daily as she had elevated LFT's with Atorvastatin in the past. I did recommend that we increase Crestor from 10 mg daily to 20 mg daily and will plan for follow-up FLP and LFT's in 2 months.  If LFT's become elevated with Crestor, would refer to the Lipid Clinic.  3. HTN - BP is well-controlled at 128/58 during today's visit. Continue current medical therapy with Lisinopril 20 mg daily and Amlodipine 10 mg daily.  4. Vaginal Bleeding - This has been occurring since initiation of Brilinta and she reports she is now mostly experiencing mild spotting. Reports having hot flashes for several  years prior to this. Will recheck a CBC today and enter a referral to OB/GYN. We reviewed we cannot hold Brilinta at this time given her recent stents unless [name] emergent scenario.   Disposition: Keep previously scheduled follow-up with Dr. Tenny Craw in 09/2023.  Signed, Ellsworth Lennox, PA-C

## 2023-07-04 ENCOUNTER — Telehealth (HOSPITAL_COMMUNITY): Payer: Self-pay | Admitting: *Deleted

## 2023-07-07 ENCOUNTER — Encounter (HOSPITAL_COMMUNITY)
Admission: RE | Admit: 2023-07-07 | Discharge: 2023-07-07 | Disposition: A | Discharge status: Home / Self Care | Payer: Medicare PPO | Source: Ambulatory Visit | Attending: Internal Medicine | Admitting: Internal Medicine

## 2023-07-07 ENCOUNTER — Other Ambulatory Visit (HOSPITAL_COMMUNITY)
Admission: RE | Admit: 2023-07-07 | Discharge: 2023-07-07 | Disposition: A | Discharge status: Home / Self Care | Payer: Medicare PPO | Source: Ambulatory Visit | Attending: Internal Medicine | Admitting: Internal Medicine

## 2023-07-07 VITALS — Ht 60.0 in | Wt 155.1 lb

## 2023-07-07 DIAGNOSIS — Z955 Presence of coronary angioplasty implant and graft: Secondary | ICD-10-CM | POA: Diagnosis present

## 2023-07-07 DIAGNOSIS — N939 Abnormal uterine and vaginal bleeding, unspecified: Secondary | ICD-10-CM | POA: Insufficient documentation

## 2023-07-07 DIAGNOSIS — Z48812 Encounter for surgical aftercare following surgery on the circulatory system: Secondary | ICD-10-CM | POA: Diagnosis not present

## 2023-07-07 LAB — CBC
HCT: 39.7 % (ref 36.0–46.0)
Hemoglobin: 13 g/dL (ref 12.0–15.0)
MCH: 29.3 pg (ref 26.0–34.0)
MCHC: 32.7 g/dL (ref 30.0–36.0)
MCV: 89.4 fL (ref 80.0–100.0)
Platelets: 215 10*3/uL (ref 150–400)
RBC: 4.44 MIL/uL (ref 3.87–5.11)
RDW: 12.8 % (ref 11.5–15.5)
WBC: 7.5 10*3/uL (ref 4.0–10.5)
nRBC: 0 % (ref 0.0–0.2)

## 2023-07-07 NOTE — Patient Instructions (Signed)
Patient Instructions  Patient Details  Name: [name] MRN: [id number] Date of Birth: [birth year] Referring Provider:  Marykay Lex, MD  Below are your personal goals for exercise, nutrition, and risk factors. Our goal is to help you stay on track towards obtaining and maintaining these goals. We will be discussing your progress on these goals with you throughout the program.  Initial Exercise Prescription:  Initial Exercise Prescription - 07/07/23 1000       Date of Initial Exercise RX and Referring Provider   Date 07/07/23    Referring Provider Dietrich Pates MD      Oxygen   Maintain Oxygen Saturation 88% or higher      Treadmill   MPH 1.6    Grade 0.5    Minutes 15    METs 2.33      NuStep   Level 1    SPM 80    Minutes 15    METs 2      Prescription Details   Frequency (times per week) 2    Duration Progress to 30 minutes of continuous aerobic without signs/symptoms of physical distress      Intensity   THRR 40-80% of Max Heartrate 90-126    Ratings of Perceived Exertion 11-13    Perceived Dyspnea 0-4      Progression   Progression Continue to progress workloads to maintain intensity without signs/symptoms of physical distress.      Resistance Training   Training Prescription Yes    Weight 3 lb    Reps 10-15             Exercise Goals: Frequency: Be able to perform aerobic exercise two to three times per week in program working toward 2-5 days per week of home exercise.  Intensity: Work with a perceived exertion of 11 (fairly light) - 15 (hard) while following your exercise prescription.  We will make changes to your prescription with you as you progress through the program.   Duration: Be able to do 30 to 45 minutes of continuous aerobic exercise in addition to a 5 minute warm-up and a 5 minute cool-down routine.   Nutrition Goals: Your personal nutrition goals will be established when you do your nutrition analysis with the dietician.  The  following are general nutrition guidelines to follow: Cholesterol < 200mg /day Sodium < 1500mg /day Fiber: Women over 50 yrs - 21 grams per day  Personal Goals:  Personal Goals and Risk Factors at Admission - 07/07/23 1015       Core Components/Risk Factors/Patient Goals on Admission    Weight Management Yes;Weight Loss    Intervention Weight Management: Develop a combined nutrition and exercise program designed to reach desired caloric intake, while maintaining appropriate intake of nutrient and fiber, sodium and fats, and appropriate energy expenditure required for the weight goal.;Weight Management: Provide education and appropriate resources to help participant work on and attain dietary goals.    Admit Weight 155 lb 1.6 oz (70.4 kg)    Goal Weight: Short Term 153 lb (69.4 kg)    Goal Weight: Long Term 150 lb (68 kg)    Expected Outcomes Short Term: Continue to assess and modify interventions until short term weight is achieved;Long Term: Adherence to nutrition and physical activity/exercise program aimed toward attainment of established weight goal;Weight Loss: Understanding of general recommendations for a balanced deficit meal plan, which promotes 1-2 lb weight loss per week and includes a negative energy balance of [phone] kcal/d;Understanding recommendations for meals to  include 15-35% energy as protein, 25-35% energy from fat, 35-60% energy from carbohydrates, less than 200mg  of dietary cholesterol, 20-35 gm of total fiber daily;Understanding of distribution of calorie intake throughout the day with the consumption of 4-5 meals/snacks    Diabetes Yes    Intervention Provide education about proper nutrition, including hydration, and aerobic/resistive exercise prescription along with prescribed medications to achieve blood glucose in normal ranges: Fasting glucose 65-99 mg/dL    Expected Outcomes Short Term: Participant verbalizes understanding of the signs/symptoms and immediate care of  hyper/hypoglycemia, proper foot care and importance of medication, aerobic/resistive exercise and nutrition plan for blood glucose control.;Long Term: Attainment of HbA1C < 7%.    Hypertension Yes    Intervention Provide education on lifestyle modifcations including regular physical activity/exercise, weight management, moderate sodium restriction and increased consumption of fresh fruit, vegetables, and low fat dairy, alcohol moderation, and smoking cessation.;Monitor prescription use compliance.    Expected Outcomes Short Term: Continued assessment and intervention until BP is < 140/65mm HG in hypertensive participants. < 130/6mm HG in hypertensive participants with diabetes, heart failure or chronic kidney disease.;Long Term: Maintenance of blood pressure at goal levels.    Lipids Yes    Intervention Provide education and support for participant on nutrition & aerobic/resistive exercise along with prescribed medications to achieve LDL 70mg , HDL >40mg .    Expected Outcomes Short Term: Participant states understanding of desired cholesterol values and is compliant with medications prescribed. Participant is following exercise prescription and nutrition guidelines.;Long Term: Cholesterol controlled with medications as prescribed, with individualized exercise RX and with personalized nutrition plan. Value goals: LDL < 70mg , HDL > 40 mg.             Tobacco Use Initial Evaluation: Social History   Tobacco Use  Smoking Status Never   Passive exposure: Never  Smokeless Tobacco Never    Exercise Goals and Review:  Exercise Goals     Row Name 07/07/23 1013             Exercise Goals   Increase Physical Activity Yes       Intervention Provide advice, education, support and counseling about physical activity/exercise needs.;Develop an individualized exercise prescription for aerobic and resistive training based on initial evaluation findings, risk stratification, comorbidities and  participant's personal goals.       Expected Outcomes Short Term: Attend rehab on a regular basis to increase amount of physical activity.;Long Term: Add in home exercise to make exercise part of routine and to increase amount of physical activity.;Long Term: Exercising regularly at least 3-5 days a week.       Increase Strength and Stamina Yes       Intervention Provide advice, education, support and counseling about physical activity/exercise needs.;Develop an individualized exercise prescription for aerobic and resistive training based on initial evaluation findings, risk stratification, comorbidities and participant's personal goals.       Expected Outcomes Short Term: Increase workloads from initial exercise prescription for resistance, speed, and METs.;Short Term: Perform resistance training exercises routinely during rehab and add in resistance training at home;Long Term: Improve cardiorespiratory fitness, muscular endurance and strength as measured by increased METs and functional capacity ( )       Able to understand and use rate of perceived exertion (RPE) scale Yes       Intervention Provide education and explanation on how to use RPE scale       Expected Outcomes Short Term: Able to use RPE daily in rehab to express subjective  intensity level;Long Term:  Able to use RPE to guide intensity level when exercising independently       Able to understand and use Dyspnea scale Yes       Intervention Provide education and explanation on how to use Dyspnea scale       Expected Outcomes Long Term: Able to use Dyspnea scale to guide intensity level when exercising independently;Short Term: Able to use Dyspnea scale daily in rehab to express subjective sense of shortness of breath during exertion       Knowledge and understanding of Target Heart Rate Range (THRR) Yes       Intervention Provide education and explanation of THRR including how the numbers were predicted and where they are located for  reference       Expected Outcomes Short Term: Able to state/look up THRR;Short Term: Able to use daily as guideline for intensity in rehab;Long Term: Able to use THRR to govern intensity when exercising independently       Able to check pulse independently Yes       Intervention Provide education and demonstration on how to check pulse in carotid and radial arteries.;Review the importance of being able to check your own pulse for safety during independent exercise       Expected Outcomes Short Term: Able to explain why pulse checking is important during independent exercise;Long Term: Able to check pulse independently and accurately       Understanding of Exercise Prescription Yes       Intervention Provide education, explanation, and written materials on patient's individual exercise prescription       Expected Outcomes Short Term: Able to explain program exercise prescription;Long Term: Able to explain home exercise prescription to exercise independently                Copy of goals given to participant.

## 2023-07-07 NOTE — Progress Notes (Signed)
Cardiac Individual Treatment Plan  Patient Details  Name: [name] MRN: [id number] Date of Birth: 05/04/1946 Referring Provider:   Flowsheet Row CARDIAC REHAB PHASE II ORIENTATION from 07/07/2023 in [facility] CARDIAC REHABILITATION  Referring Provider Dietrich Pates MD       Initial Encounter Date:  Flowsheet Row CARDIAC REHAB PHASE II ORIENTATION from 07/07/2023 in [place] CARDIAC REHABILITATION  Date 07/07/23       Visit Diagnosis: Status post coronary artery stent placement  Patient's Home Medications on Admission:  Current Outpatient Medications:    amLODipine (NORVASC) 10 MG tablet, Take 1 tablet (10 mg total) by mouth daily. (Patient taking differently: Take 10 mg by mouth at bedtime.), Disp: 30 tablet, Rfl: 2   aspirin EC 81 MG tablet, Take 81 mg by mouth every morning., Disp: , Rfl:    calcium elemental as carbonate (BARIATRIC TUMS ULTRA) 400 MG chewable tablet, Chew 1-2 tablets by mouth 2 (two) times daily as needed for indigestion or heartburn., Disp: , Rfl:    Cholecalciferol (VITAMIN D) 125 MCG (5000 UT) CAPS, Take 1 tablet by mouth in the morning., Disp: , Rfl:    empagliflozin (JARDIANCE) 25 MG TABS tablet, Take 25 mg by mouth in the morning., Disp: , Rfl:    JANUMET XR [phone] MG TB24, Take 1 tablet by mouth in the morning., Disp: , Rfl:    lisinopril (PRINIVIL,ZESTRIL) 20 MG tablet, Take 20 mg by mouth in the morning., Disp: , Rfl:    nitroGLYCERIN (NITROSTAT) 0.4 MG SL tablet, Place 1 tablet (0.4 mg total) under the tongue every 5 (five) minutes as needed for chest pain., Disp: 25 tablet, Rfl: 3   Polyethyl Glycol-Propyl Glycol (SYSTANE OP), Place 1 drop into both eyes 2 (two) times daily., Disp: , Rfl:    rosuvastatin (CRESTOR) 20 MG tablet, Take 1 tablet (20 mg total) by mouth daily., Disp: 90 tablet, Rfl: 3   ticagrelor (BRILINTA) 90 MG TABS tablet, Take 1 tablet (90 mg total) by mouth 2 (two) times daily., Disp: 180 tablet, Rfl: 2  Past Medical History: Past  Medical History:  Diagnosis Date   Arthritis    CAD (coronary artery disease)    a. s/p BMS to LCx in 2008 b. DESx1 to mid-LAD and DESx2 to D1 in 06/2023   Cancer (HCC)    Diabetes mellitus (HCC)    type 2 x  1 or 2 yrs   Fatty liver    GERD (gastroesophageal reflux disease)    High cholesterol    Hypertension    Seasonal allergies     Tobacco Use: Social History   Tobacco Use  Smoking Status Never   Passive exposure: Never  Smokeless Tobacco Never    Labs: Review Flowsheet  More data may exist      Latest Ref Rng & Units 09/12/2007 05/11/2013 10/22/2016 11/30/2021 06/18/2023  Labs for ITP Cardiac and Pulmonary Rehab  Cholestrol 0 - 200 mg/dL 409        ATP III CLASSIFICATION:  <200     mg/dL   Desirable  811-914  mg/dL   Borderline High  >=782    mg/dL   High  956  213  086  -  LDL (calc) 0 - 99 mg/dL 578        Total Cholesterol/HDL:CHD Risk Coronary Heart Disease Risk Table                     Men   Women  1/2  Average Risk   3.4   3.3  66  147  59  -  HDL-C >40 mg/dL 32  43  45  38  -  Trlycerides <150 mg/dL 782  956  213  56  -  Hemoglobin A1c 4.8 - 5.6 % 6.3 (NOTE)  Therapeutic target for the treatment of diabetes mellitus patients is <7% HbA1c.  American Diabetes Association Diabetes Care 2002;25:S33-S49.  - - 7.2  -  PH, Arterial 7.35 - 7.45 - - - - 7.390   PCO2 arterial 32 - 48 mmHg - - - - 37.1   Bicarbonate 20.0 - 28.0 mmol/L 20.0 - 28.0 mmol/L - - - - 24.6  24.4  22.4   TCO2 22 - 32 mmol/L 22 - 32 mmol/L - - - - 26  26  24    Acid-base deficit 0.0 - 2.0 mmol/L 0.0 - 2.0 mmol/L - - - - 1.0  1.0  2.0   O2 Saturation % % - - - - 70  71  97     Details       Multiple values from one day are sorted in reverse-chronological order         Capillary Blood Glucose: Lab Results  Component Value Date   GLUCAP 107 (H) 06/18/2023   GLUCAP 107 (H) 06/18/2023   GLUCAP 154 (H) 11/29/2021   GLUCAP 53 (L) 11/29/2021   GLUCAP 61 (L) 11/29/2021      Exercise Target Goals: Exercise Program Goal: Individual exercise prescription set using results from initial 6 min walk test and THRR while considering  patient's activity barriers and safety.   Exercise Prescription Goal: Starting with aerobic activity 30 plus minutes a day, 3 days per week for initial exercise prescription. Provide home exercise prescription and guidelines that participant acknowledges understanding prior to discharge.  Activity Barriers & Risk Stratification:  Activity Barriers & Cardiac Risk Stratification - 07/07/23 1011       Activity Barriers & Cardiac Risk Stratification   Activity Barriers Arthritis;Balance Concerns;Deconditioning;Muscular Weakness;Joint Problems   chronic knee pain from arthritisi   Cardiac Risk Stratification Moderate             6 Minute Walk:  6 Minute Walk     Row Name 07/07/23 1010         6 Minute Walk   Phase Initial     Distance 964 feet     Walk Time 6 minutes     # of Rest Breaks 0     MPH 1.83     METS 1.57     RPE 12     VO2 Peak 5.49     Symptoms Yes (comment)     Comments knee pain 5/10     Resting HR 54 bpm     Resting BP 130/56     Resting Oxygen Saturation  98 %     Exercise Oxygen Saturation  during 6 min walk 97 %     Max Ex. HR 75 bpm     Max Ex. BP 148/74     2 Minute Post BP 122/62              Oxygen Initial Assessment:   Oxygen Re-Evaluation:   Oxygen Discharge (Final Oxygen Re-Evaluation):   Initial Exercise Prescription:  Initial Exercise Prescription - 07/07/23 1000       Date of Initial Exercise RX and Referring Provider   Date 07/07/23    Referring Provider Dietrich Pates MD  Oxygen   Maintain Oxygen Saturation 88% or higher      Treadmill   MPH 1.6    Grade 0.5    Minutes 15    METs 2.33      NuStep   Level 1    SPM 80    Minutes 15    METs 2      Prescription Details   Frequency (times per week) 2    Duration Progress to 30 minutes of continuous  aerobic without signs/symptoms of physical distress      Intensity   THRR 40-80% of Max Heartrate 90-126    Ratings of Perceived Exertion 11-13    Perceived Dyspnea 0-4      Progression   Progression Continue to progress workloads to maintain intensity without signs/symptoms of physical distress.      Resistance Training   Training Prescription Yes    Weight 3 lb    Reps 10-15             Perform Capillary Blood Glucose checks as needed.  Exercise Prescription Changes:   Exercise Prescription Changes     Row Name 07/07/23 1000             Response to Exercise   Blood Pressure (Admit) 130/56       Blood Pressure (Exercise) 148/74       Blood Pressure (Exit) 122/62       Heart Rate (Admit) 54 bpm       Heart Rate (Exercise) 75 bpm       Heart Rate (Exit) 57 bpm       Oxygen Saturation (Admit) 98 %       Oxygen Saturation (Exercise) 97 %       Rating of Perceived Exertion (Exercise) 12       Symptoms knee pain 5/10       Comments walk test results                Exercise Comments:   Exercise Goals and Review:   Exercise Goals     Row Name 07/07/23 1013             Exercise Goals   Increase Physical Activity Yes       Intervention Provide advice, education, support and counseling about physical activity/exercise needs.;Develop an individualized exercise prescription for aerobic and resistive training based on initial evaluation findings, risk stratification, comorbidities and participant's personal goals.       Expected Outcomes Short Term: Attend rehab on a regular basis to increase amount of physical activity.;Long Term: Add in home exercise to make exercise part of routine and to increase amount of physical activity.;Long Term: Exercising regularly at least 3-5 days a week.       Increase Strength and Stamina Yes       Intervention Provide advice, education, support and counseling about physical activity/exercise needs.;Develop an individualized  exercise prescription for aerobic and resistive training based on initial evaluation findings, risk stratification, comorbidities and participant's personal goals.       Expected Outcomes Short Term: Increase workloads from initial exercise prescription for resistance, speed, and METs.;Short Term: Perform resistance training exercises routinely during rehab and add in resistance training at home;Long Term: Improve cardiorespiratory fitness, muscular endurance and strength as measured by increased METs and functional capacity ( )       Able to understand and use rate of perceived exertion (RPE) scale Yes       Intervention Provide education and explanation  on how to use RPE scale       Expected Outcomes Short Term: Able to use RPE daily in rehab to express subjective intensity level;Long Term:  Able to use RPE to guide intensity level when exercising independently       Able to understand and use Dyspnea scale Yes       Intervention Provide education and explanation on how to use Dyspnea scale       Expected Outcomes Long Term: Able to use Dyspnea scale to guide intensity level when exercising independently;Short Term: Able to use Dyspnea scale daily in rehab to express subjective sense of shortness of breath during exertion       Knowledge and understanding of Target Heart Rate Range (THRR) Yes       Intervention Provide education and explanation of THRR including how the numbers were predicted and where they are located for reference       Expected Outcomes Short Term: Able to state/look up THRR;Short Term: Able to use daily as guideline for intensity in rehab;Long Term: Able to use THRR to govern intensity when exercising independently       Able to check pulse independently Yes       Intervention Provide education and demonstration on how to check pulse in carotid and radial arteries.;Review the importance of being able to check your own pulse for safety during independent exercise       Expected  Outcomes Short Term: Able to explain why pulse checking is important during independent exercise;Long Term: Able to check pulse independently and accurately       Understanding of Exercise Prescription Yes       Intervention Provide education, explanation, and written materials on patient's individual exercise prescription       Expected Outcomes Short Term: Able to explain program exercise prescription;Long Term: Able to explain home exercise prescription to exercise independently                Exercise Goals Re-Evaluation :  Exercise Goals Re-Evaluation     Row Name 07/07/23 1014             Exercise Goal Re-Evaluation   Exercise Goals Review Able to understand and use rate of perceived exertion (RPE) scale;Able to understand and use Dyspnea scale       Comments Reviewed RPE  and dyspnea scale, THR and program prescription with pt today.  Pt voiced understanding and was given a copy of goals to take home.       Expected Outcomes Short: Use RPE daily to regulate intensity.  Long: Follow program prescription                 Discharge Exercise Prescription (Final Exercise Prescription Changes):  Exercise Prescription Changes - 07/07/23 1000       Response to Exercise   Blood Pressure (Admit) 130/56    Blood Pressure (Exercise) 148/74    Blood Pressure (Exit) 122/62    Heart Rate (Admit) 54 bpm    Heart Rate (Exercise) 75 bpm    Heart Rate (Exit) 57 bpm    Oxygen Saturation (Admit) 98 %    Oxygen Saturation (Exercise) 97 %    Rating of Perceived Exertion (Exercise) 12    Symptoms knee pain 5/10    Comments walk test results             Nutrition:  Target Goals: Understanding of nutrition guidelines, daily intake of sodium 1500mg , cholesterol 200mg , calories 30% from fat  and 7% or less from saturated fats, daily to have 5 or more servings of fruits and vegetables.  Biometrics:  Pre Biometrics - 07/07/23 1014       Pre Biometrics   Height 5' (1.524 m)     Weight 70.4 kg    Waist Circumference 33 inches    Hip Circumference 40 inches    Waist to Hip Ratio 0.83 %    BMI (Calculated) 30.29    Grip Strength 21.3 kg    Single Leg Stand 3.9 seconds              Nutrition Therapy Plan and Nutrition Goals:   Nutrition Assessments:  Nutrition Assessments - 07/07/23 0858       MEDFICTS Scores   Pre Score 3            MEDIFICTS Score Key: ?70 Need to make dietary changes  40-70 Heart Healthy Diet ? 40 Therapeutic Level Cholesterol Diet   Picture Your Plate Scores: <21 Unhealthy dietary pattern with much room for improvement. 41-50 Dietary pattern unlikely to meet recommendations for good health and room for improvement. 51-60 More healthful dietary pattern, with some room for improvement.  >60 Healthy dietary pattern, although there may be some specific behaviors that could be improved.    Nutrition Goals Re-Evaluation:   Nutrition Goals Discharge (Final Nutrition Goals Re-Evaluation):   Psychosocial: Target Goals: Acknowledge presence or absence of significant depression and/or stress, maximize coping skills, provide positive support system. Participant is able to verbalize types and ability to use techniques and skills needed for reducing stress and depression.  Initial Review & Psychosocial Screening:  Initial Psych Review & Screening - 07/07/23 0900       Initial Review   Current issues with None Identified      Family Dynamics   Good Support System? Yes    Comments Patient's friend and chuch family are her support.      Barriers   Psychosocial barriers to participate in program There are no identifiable barriers or psychosocial needs.      Screening Interventions   Interventions Encouraged to exercise;To provide support and resources with identified psychosocial needs;Provide feedback about the scores to participant    Expected Outcomes Short Term goal: Utilizing psychosocial counselor, staff and physician  to assist with identification of specific Stressors or current issues interfering with healing process. Setting desired goal for each stressor or current issue identified.;Long Term Goal: Stressors or current issues are controlled or eliminated.;Short Term goal: Identification and review with participant of any Quality of Life or Depression concerns found by scoring the questionnaire.;Long Term goal: The participant improves quality of Life and PHQ9 Scores as seen by post scores and/or verbalization of changes             Quality of Life Scores:  Quality of Life - 07/07/23 1015       Quality of Life   Select Quality of Life      Quality of Life Scores   Health/Function Pre 29.14 %    Socioeconomic Pre 26 %    Psych/Spiritual Pre 30 %    Family Pre 25.5 %    GLOBAL Pre 28.26 %            Scores of 19 and below usually indicate a poorer quality of life in these areas.  A difference of  2-3 points is a clinically meaningful difference.  A difference of 2-3 points in the total score of the Quality of  Life Index has been associated with significant improvement in overall quality of life, self-image, physical symptoms, and general health in studies assessing change in quality of life.  PHQ-9: Review Flowsheet       07/07/2023  Depression screen PHQ 2/9  Decreased Interest 0  Down, Depressed, Hopeless 0  PHQ - 2 Score 0  Altered sleeping 0  Tired, decreased energy 0  Change in appetite 0  Feeling bad or failure about yourself  0  Trouble concentrating 0  Moving slowly or fidgety/restless 0  Suicidal thoughts 0  PHQ-9 Score 0    Details           Interpretation of Total Score  Total Score Depression Severity:  1-4 = Minimal depression, 5-9 = Mild depression, 10-14 = Moderate depression, 15-19 = Moderately severe depression, 20-27 = Severe depression   Psychosocial Evaluation and Intervention:  Psychosocial Evaluation - 07/07/23 0903       Psychosocial Evaluation &  Interventions   Interventions Relaxation education;Stress management education;Encouraged to exercise with the program and follow exercise prescription    Comments Patient was referred to cardiac rehab with DES x2 06/18/23. Her PHQ-9 score was 0. She denies any depression, anxiety or stressors in her life. 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 She has a daughter, 2 grandchildren and several great grand children. She says she does not have alot of contact with her family. Her slibings are deceased. She does have a lot of support from a close friend and several friends from her chruch and her church family. She has a $25 copayment which could be a barrier for her to complete the program. She is going to do 2 days/week due to the copayment. She also has OA in her right knee and she is not sure if any of the exercising will aggervate this. We ask that she will participate for at least a month and she said she will see how things go. She reports that her right arm is still tender from the catheterization and she started having vaginal bleeding since her stent. She addressed these issues with Randall An, PA at her follow up OV. She was told to use warm compresses on her arm and that the Brilinta could be causing the bleeding. She has a history of uterine cancer treated with a partial hysterectomy several years ago. She is scheduled to see OBGYN later this month. Her main goal for the program is to get back to her normal health prior to her stent placement.    Expected Outcomes Patient will be able to complete at least a month of the program.    Continue Psychosocial Services  Follow up required by staff             Psychosocial Re-Evaluation:   Psychosocial Discharge (Final Psychosocial Re-Evaluation):   Vocational Rehabilitation: Provide vocational rehab assistance to  qualifying candidates.   Vocational Rehab Evaluation & Intervention:  Vocational Rehab - 07/07/23 4098       Initial Vocational Rehab Evaluation & Intervention   Assessment shows need for Vocational Rehabilitation No      Vocational Rehab Re-Evaulation   Comments Patient has already retruned to work as a Comptroller for an elderly lady.             Education: Education Goals: Education classes will be provided on a weekly basis,  covering required topics. Participant will state understanding/return demonstration of topics presented.  Learning Barriers/Preferences:  Learning Barriers/Preferences - 07/07/23 0858       Learning Barriers/Preferences   Learning Barriers None    Learning Preferences Audio             Education Topics: Hypertension, Hypertension Reduction -Define heart disease and high blood pressure. Discus how high blood pressure affects the body and ways to reduce high blood pressure.   Exercise and Your Heart -Discuss why it is important to exercise, the FITT principles of exercise, normal and abnormal responses to exercise, and how to exercise safely.   Angina -Discuss definition of angina, causes of angina, treatment of angina, and how to decrease risk of having angina.   Cardiac Medications -Review what the following cardiac medications are used for, how they affect the body, and side effects that may occur when taking the medications.  Medications include Aspirin, Beta blockers, calcium channel blockers, ACE Inhibitors, angiotensin receptor blockers, diuretics, digoxin, and antihyperlipidemics.   Congestive Heart Failure -Discuss the definition of CHF, how to live with CHF, the signs and symptoms of CHF, and how keep track of weight and sodium intake.   Heart Disease and Intimacy -Discus the effect sexual activity has on the heart, how changes occur during intimacy as we age, and safety during sexual activity.   Smoking Cessation /  COPD -Discuss different methods to quit smoking, the health benefits of quitting smoking, and the definition of COPD.   Nutrition I: Fats -Discuss the types of cholesterol, what cholesterol does to the heart, and how cholesterol levels can be controlled.   Nutrition II: Labels -Discuss the different components of food labels and how to read food label   Heart Parts/Heart Disease and PAD -Discuss the anatomy of the heart, the pathway of blood circulation through the heart, and these are affected by heart disease.   Stress I: Signs and Symptoms -Discuss the causes of stress, how stress may lead to anxiety and depression, and ways to limit stress.   Stress II: Relaxation -Discuss different types of relaxation techniques to limit stress.   Warning Signs of Stroke / TIA -Discuss definition of a stroke, what the signs and symptoms are of a stroke, and how to identify when someone is having stroke.   Knowledge Questionnaire Score:  Knowledge Questionnaire Score - 07/07/23 0858       Knowledge Questionnaire Score   Pre Score 19/24             Core Components/Risk Factors/Patient Goals at Admission:  Personal Goals and Risk Factors at Admission - 07/07/23 1015       Core Components/Risk Factors/Patient Goals on Admission    Weight Management Yes;Weight Loss    Intervention Weight Management: Develop a combined nutrition and exercise program designed to reach desired caloric intake, while maintaining appropriate intake of nutrient and fiber, sodium and fats, and appropriate energy expenditure required for the weight goal.;Weight Management: Provide education and appropriate resources to help participant work on and attain dietary goals.    Admit Weight 155 lb 1.6 oz (70.4 kg)    Goal Weight: Short Term 153 lb (69.4 kg)    Goal Weight: Long Term 150 lb (68 kg)    Expected Outcomes Short Term: Continue to assess and modify interventions until short term weight is achieved;Long  Term: Adherence to nutrition and physical activity/exercise program aimed toward attainment of established weight goal;Weight Loss: Understanding of general recommendations for a balanced deficit  meal plan, which promotes 1-2 lb weight loss per week and includes a negative energy balance of [phone] kcal/d;Understanding recommendations for meals to include 15-35% energy as protein, 25-35% energy from fat, 35-60% energy from carbohydrates, less than 200mg  of dietary cholesterol, 20-35 gm of total fiber daily;Understanding of distribution of calorie intake throughout the day with the consumption of 4-5 meals/snacks    Diabetes Yes    Intervention Provide education about proper nutrition, including hydration, and aerobic/resistive exercise prescription along with prescribed medications to achieve blood glucose in normal ranges: Fasting glucose 65-99 mg/dL    Expected Outcomes Short Term: Participant verbalizes understanding of the signs/symptoms and immediate care of hyper/hypoglycemia, proper foot care and importance of medication, aerobic/resistive exercise and nutrition plan for blood glucose control.;Long Term: Attainment of HbA1C < 7%.    Hypertension Yes    Intervention Provide education on lifestyle modifcations including regular physical activity/exercise, weight management, moderate sodium restriction and increased consumption of fresh fruit, vegetables, and low fat dairy, alcohol moderation, and smoking cessation.;Monitor prescription use compliance.    Expected Outcomes Short Term: Continued assessment and intervention until BP is < 140/37mm HG in hypertensive participants. < 130/8mm HG in hypertensive participants with diabetes, heart failure or chronic kidney disease.;Long Term: Maintenance of blood pressure at goal levels.    Lipids Yes    Intervention Provide education and support for participant on nutrition & aerobic/resistive exercise along with prescribed medications to achieve LDL 70mg ,  HDL >40mg .    Expected Outcomes Short Term: Participant states understanding of desired cholesterol values and is compliant with medications prescribed. Participant is following exercise prescription and nutrition guidelines.;Long Term: Cholesterol controlled with medications as prescribed, with individualized exercise RX and with personalized nutrition plan. Value goals: LDL < 70mg , HDL > 40 mg.             Core Components/Risk Factors/Patient Goals Review:    Core Components/Risk Factors/Patient Goals at Discharge (Final Review):    ITP Comments:   Comments: Patient arrived for 1st visit/orientation/education at 0800. Patient was referred to CR by Dr. Dorethea Clan due to Status Post coronary artery stent placement. During orientation advised patient on arrival and appointment times what to wear, what to do before, during and after exercise. Reviewed attendance and class policy.  Pt is scheduled to return Cardiac Rehab on 07/14/23 at 1100. Pt was advised to come to class 15 minutes before class starts.  Discussed RPE/Dpysnea scales. Patient participated in warm up stretches. Patient was able to complete 6 minute walk test.  Telemetry:NSR. Patient was measured for the equipment. Discussed equipment safety with patient. Took patient pre-anthropometric measurements. Patient finished visit at 0915.

## 2023-07-08 ENCOUNTER — Telehealth: Payer: Self-pay | Admitting: *Deleted

## 2023-07-08 ENCOUNTER — Other Ambulatory Visit: Payer: Self-pay

## 2023-07-08 DIAGNOSIS — E785 Hyperlipidemia, unspecified: Secondary | ICD-10-CM

## 2023-07-08 DIAGNOSIS — Z79899 Other long term (current) drug therapy: Secondary | ICD-10-CM

## 2023-07-08 NOTE — Telephone Encounter (Signed)
Pt notified and order placed 

## 2023-07-08 NOTE — Telephone Encounter (Signed)
-----   Message from Ellsworth Lennox sent at 07/07/2023  7:52 PM EDT ----- Please let the patient know her hemoglobin and platelets are within a normal range. Continue Brilinta and keep follow-up with Gynecology. She does need a repeat FLP and LFT's in 2 months as discussed during her visit. Orders will need to be entered (was not entered at the time of her visit as we did not want them to be drawn prematurely).

## 2023-07-14 ENCOUNTER — Encounter (HOSPITAL_COMMUNITY)
Admission: RE | Admit: 2023-07-14 | Discharge: 2023-07-14 | Disposition: A | Discharge status: Home / Self Care | Payer: Medicare PPO | Source: Ambulatory Visit | Attending: Internal Medicine | Admitting: Internal Medicine

## 2023-07-14 DIAGNOSIS — Z955 Presence of coronary angioplasty implant and graft: Secondary | ICD-10-CM | POA: Diagnosis not present

## 2023-07-14 DIAGNOSIS — N939 Abnormal uterine and vaginal bleeding, unspecified: Secondary | ICD-10-CM | POA: Diagnosis not present

## 2023-07-14 DIAGNOSIS — Z48812 Encounter for surgical aftercare following surgery on the circulatory system: Secondary | ICD-10-CM | POA: Diagnosis not present

## 2023-07-14 LAB — GLUCOSE, CAPILLARY
Glucose-Capillary: 169 mg/dL — ABNORMAL HIGH (ref 70–99)
Glucose-Capillary: 164 mg/dL — ABNORMAL HIGH (ref 70–99)

## 2023-07-14 NOTE — Progress Notes (Signed)
Daily Session Note  Patient Details  Name: [name] MRN: [id number] Date of Birth: 06-20-1946 Referring Provider:   Flowsheet Row CARDIAC REHAB PHASE II ORIENTATION from 07/07/2023 in [facility] C[facility]  Referring Provider Dietrich Pates MD       Encounter Date: 07/14/2023  Check In:  Session Check In - 07/14/23 1030       Check-In   Supervising physician immediately available to respond to emergencies See telemetry face sheet for immediately available MD    Location [facility]    Staff Present Staci Righter, RN, Neal Dy, RN, BSN;Heather Fredric Mare, BS, Exercise Physiologist    Virtual Visit No    Medication changes reported     No    Fall or balance concerns reported    No    Tobacco Cessation No Change    Warm-up and Cool-down Performed on first and last piece of equipment    Resistance Training Performed Yes    VAD Patient? No    PAD/SET Patient? No      Pain Assessment   Currently in Pain? No/denies    Pain Score 0-No pain             Capillary Blood Glucose: Results for orders placed or performed during the hospital encounter of 07/14/23 (from the past 24 hour(s))  Glucose, capillary     Status: Abnormal   Collection Time: 07/14/23 10:50 AM  Result Value Ref Range   Glucose-Capillary 169 (H) 70 - 99 mg/dL      Social History   Tobacco Use  Smoking Status Never   Passive exposure: Never  Smokeless Tobacco Never    Goals Met:  Independence with exercise equipment Exercise tolerated well No report of concerns or symptoms today Strength training completed today  Goals Unmet:  Not Applicable  Comments: First full day of exercise!  Patient was oriented to gym and equipment including functions, settings, policies, and procedures.  Patient's individual exercise prescription and treatment plan were reviewed.  All starting workloads were established based on the results of the 6 minute walk test done at initial  orientation visit.  The plan for exercise progression was also introduced and progression will be customized based on patient's performance and goals.    Dr. Dina Rich is Medical Director for [facility]

## 2023-07-16 ENCOUNTER — Encounter (HOSPITAL_COMMUNITY)
Admission: RE | Admit: 2023-07-16 | Discharge: 2023-07-16 | Disposition: A | Discharge status: Home / Self Care | Payer: Medicare PPO | Source: Ambulatory Visit | Attending: Internal Medicine | Admitting: Internal Medicine

## 2023-07-16 ENCOUNTER — Encounter (HOSPITAL_COMMUNITY): Payer: Self-pay | Admitting: *Deleted

## 2023-07-16 DIAGNOSIS — Z48812 Encounter for surgical aftercare following surgery on the circulatory system: Secondary | ICD-10-CM | POA: Diagnosis not present

## 2023-07-16 DIAGNOSIS — Z955 Presence of coronary angioplasty implant and graft: Secondary | ICD-10-CM | POA: Diagnosis not present

## 2023-07-16 DIAGNOSIS — N939 Abnormal uterine and vaginal bleeding, unspecified: Secondary | ICD-10-CM | POA: Diagnosis not present

## 2023-07-16 LAB — GLUCOSE, CAPILLARY
Glucose-Capillary: 131 mg/dL — ABNORMAL HIGH (ref 70–99)
Glucose-Capillary: 104 mg/dL — ABNORMAL HIGH (ref 70–99)

## 2023-07-16 NOTE — Progress Notes (Signed)
Cardiac Individual Treatment Plan  Patient Details  Name: [name] MRN: [id number] Date of Birth: [birth year] Referring Provider:   Flowsheet Row CARDIAC REHAB PHASE II ORIENTATION from 07/07/2023 in [facility] CARDIAC REHABILITATION  Referring Provider Dietrich Pates MD       Initial Encounter Date:  Flowsheet Row CARDIAC REHAB PHASE II ORIENTATION from 07/07/2023 in [place] CARDIAC REHABILITATION  Date 07/07/23       Visit Diagnosis: Status post coronary artery stent placement  Patient's Home Medications on Admission:  Current Outpatient Medications:    amLODipine (NORVASC) 10 MG tablet, Take 1 tablet (10 mg total) by mouth daily. (Patient taking differently: Take 10 mg by mouth at bedtime.), Disp: 30 tablet, Rfl: 2   aspirin EC 81 MG tablet, Take 81 mg by mouth every morning., Disp: , Rfl:    calcium elemental as carbonate (BARIATRIC TUMS ULTRA) 400 MG chewable tablet, Chew 1-2 tablets by mouth 2 (two) times daily as needed for indigestion or heartburn., Disp: , Rfl:    Cholecalciferol (VITAMIN D) 125 MCG (5000 UT) CAPS, Take 1 tablet by mouth in the morning., Disp: , Rfl:    empagliflozin (JARDIANCE) 25 MG TABS tablet, Take 25 mg by mouth in the morning., Disp: , Rfl:    JANUMET XR [phone] MG TB24, Take 1 tablet by mouth in the morning., Disp: , Rfl:    lisinopril (PRINIVIL,ZESTRIL) 20 MG tablet, Take 20 mg by mouth in the morning., Disp: , Rfl:    nitroGLYCERIN (NITROSTAT) 0.4 MG SL tablet, Place 1 tablet (0.4 mg total) under the tongue every 5 (five) minutes as needed for chest pain., Disp: 25 tablet, Rfl: 3   Polyethyl Glycol-Propyl Glycol (SYSTANE OP), Place 1 drop into both eyes 2 (two) times daily., Disp: , Rfl:    rosuvastatin (CRESTOR) 20 MG tablet, Take 1 tablet (20 mg total) by mouth daily., Disp: 90 tablet, Rfl: 3   ticagrelor (BRILINTA) 90 MG TABS tablet, Take 1 tablet (90 mg total) by mouth 2 (two) times daily., Disp: 180 tablet, Rfl: 2  Past Medical History: Past  Medical History:  Diagnosis Date   Arthritis    CAD (coronary artery disease)    a. s/p BMS to LCx in 2008 b. DESx1 to mid-LAD and DESx2 to D1 in 06/2023   Cancer (HCC)    Diabetes mellitus (HCC)    type 2 x  1 or 2 yrs   Fatty liver    GERD (gastroesophageal reflux disease)    High cholesterol    Hypertension    Seasonal allergies     Tobacco Use: Social History   Tobacco Use  Smoking Status Never   Passive exposure: Never  Smokeless Tobacco Never    Labs: Review Flowsheet  More data may exist      Latest Ref Rng & Units 09/12/2007 05/11/2013 10/22/2016 11/30/2021 06/18/2023  Labs for ITP Cardiac and Pulmonary Rehab  Cholestrol 0 - 200 mg/dL 865        ATP III CLASSIFICATION:  <200     mg/dL   Desirable  784-696  mg/dL   Borderline High  >=295    mg/dL   High  284  132  440  -  LDL (calc) 0 - 99 mg/dL 102        Total Cholesterol/HDL:CHD Risk Coronary Heart Disease Risk Table                     Men   Women  1/2  Average Risk   3.4   3.3  66  147  59  -  HDL-C >40 mg/dL 32  43  45  38  -  Trlycerides <150 mg/dL 696  295  284  56  -  Hemoglobin A1c 4.8 - 5.6 % 6.3 (NOTE)  Therapeutic target for the treatment of diabetes mellitus patients is <7% HbA1c.  American Diabetes Association Diabetes Care 2002;25:S33-S49.  - - 7.2  -  PH, Arterial 7.35 - 7.45 - - - - 7.390   PCO2 arterial 32 - 48 mmHg - - - - 37.1   Bicarbonate 20.0 - 28.0 mmol/L 20.0 - 28.0 mmol/L - - - - 24.6  24.4  22.4   TCO2 22 - 32 mmol/L 22 - 32 mmol/L - - - - 26  26  24    Acid-base deficit 0.0 - 2.0 mmol/L 0.0 - 2.0 mmol/L - - - - 1.0  1.0  2.0   O2 Saturation % % - - - - 70  71  97     Details       Multiple values from one day are sorted in reverse-chronological order         Capillary Blood Glucose: Lab Results  Component Value Date   GLUCAP 164 (H) 07/14/2023   GLUCAP 169 (H) 07/14/2023   GLUCAP 107 (H) 06/18/2023   GLUCAP 107 (H) 06/18/2023   GLUCAP 154 (H) 11/29/2021      Exercise Target Goals: Exercise Program Goal: Individual exercise prescription set using results from initial 6 min walk test and THRR while considering  patient's activity barriers and safety.   Exercise Prescription Goal: Starting with aerobic activity 30 plus minutes a day, 3 days per week for initial exercise prescription. Provide home exercise prescription and guidelines that participant acknowledges understanding prior to discharge.  Activity Barriers & Risk Stratification:  Activity Barriers & Cardiac Risk Stratification - 07/07/23 1011       Activity Barriers & Cardiac Risk Stratification   Activity Barriers Arthritis;Balance Concerns;Deconditioning;Muscular Weakness;Joint Problems   chronic knee pain from arthritisi   Cardiac Risk Stratification Moderate             6 Minute Walk:  6 Minute Walk     Row Name 07/07/23 1010         6 Minute Walk   Phase Initial     Distance 964 feet     Walk Time 6 minutes     # of Rest Breaks 0     MPH 1.83     METS 1.57     RPE 12     VO2 Peak 5.49     Symptoms Yes (comment)     Comments knee pain 5/10     Resting HR 54 bpm     Resting BP 130/56     Resting Oxygen Saturation  98 %     Exercise Oxygen Saturation  during 6 min walk 97 %     Max Ex. HR 75 bpm     Max Ex. BP 148/74     2 Minute Post BP 122/62              Oxygen Initial Assessment:   Oxygen Re-Evaluation:   Oxygen Discharge (Final Oxygen Re-Evaluation):   Initial Exercise Prescription:  Initial Exercise Prescription - 07/07/23 1000       Date of Initial Exercise RX and Referring Provider   Date 07/07/23    Referring Provider Dietrich Pates MD  Oxygen   Maintain Oxygen Saturation 88% or higher      Treadmill   MPH 1.6    Grade 0.5    Minutes 15    METs 2.33      NuStep   Level 1    SPM 80    Minutes 15    METs 2      Prescription Details   Frequency (times per week) 2    Duration Progress to 30 minutes of continuous  aerobic without signs/symptoms of physical distress      Intensity   THRR 40-80% of Max Heartrate 90-126    Ratings of Perceived Exertion 11-13    Perceived Dyspnea 0-4      Progression   Progression Continue to progress workloads to maintain intensity without signs/symptoms of physical distress.      Resistance Training   Training Prescription Yes    Weight 3 lb    Reps 10-15             Perform Capillary Blood Glucose checks as needed.  Exercise Prescription Changes:   Exercise Prescription Changes     Row Name 07/07/23 1000             Response to Exercise   Blood Pressure (Admit) 130/56       Blood Pressure (Exercise) 148/74       Blood Pressure (Exit) 122/62       Heart Rate (Admit) 54 bpm       Heart Rate (Exercise) 75 bpm       Heart Rate (Exit) 57 bpm       Oxygen Saturation (Admit) 98 %       Oxygen Saturation (Exercise) 97 %       Rating of Perceived Exertion (Exercise) 12       Symptoms knee pain 5/10       Comments walk test results                Exercise Comments:   Exercise Comments     Row Name 07/14/23 1305           Exercise Comments First full day of exercise!  Patient was oriented to gym and equipment including functions, settings, policies, and procedures.  Patient's individual exercise prescription and treatment plan were reviewed.  All starting workloads were established based on the results of the 6 minute walk test done at initial orientation visit.  The plan for exercise progression was also introduced and progression will be customized based on patient's performance and goals.                Exercise Goals and Review:   Exercise Goals     Row Name 07/07/23 1013             Exercise Goals   Increase Physical Activity Yes       Intervention Provide advice, education, support and counseling about physical activity/exercise needs.;Develop an individualized exercise prescription for aerobic and resistive training  based on initial evaluation findings, risk stratification, comorbidities and participant's personal goals.       Expected Outcomes Short Term: Attend rehab on a regular basis to increase amount of physical activity.;Long Term: Add in home exercise to make exercise part of routine and to increase amount of physical activity.;Long Term: Exercising regularly at least 3-5 days a week.       Increase Strength and Stamina Yes       Intervention Provide advice, education, support and counseling  about physical activity/exercise needs.;Develop an individualized exercise prescription for aerobic and resistive training based on initial evaluation findings, risk stratification, comorbidities and participant's personal goals.       Expected Outcomes Short Term: Increase workloads from initial exercise prescription for resistance, speed, and METs.;Short Term: Perform resistance training exercises routinely during rehab and add in resistance training at home;Long Term: Improve cardiorespiratory fitness, muscular endurance and strength as measured by increased METs and functional capacity ( )       Able to understand and use rate of perceived exertion (RPE) scale Yes       Intervention Provide education and explanation on how to use RPE scale       Expected Outcomes Short Term: Able to use RPE daily in rehab to express subjective intensity level;Long Term:  Able to use RPE to guide intensity level when exercising independently       Able to understand and use Dyspnea scale Yes       Intervention Provide education and explanation on how to use Dyspnea scale       Expected Outcomes Long Term: Able to use Dyspnea scale to guide intensity level when exercising independently;Short Term: Able to use Dyspnea scale daily in rehab to express subjective sense of shortness of breath during exertion       Knowledge and understanding of Target Heart Rate Range (THRR) Yes       Intervention Provide education and explanation of  THRR including how the numbers were predicted and where they are located for reference       Expected Outcomes Short Term: Able to state/look up THRR;Short Term: Able to use daily as guideline for intensity in rehab;Long Term: Able to use THRR to govern intensity when exercising independently       Able to check pulse independently Yes       Intervention Provide education and demonstration on how to check pulse in carotid and radial arteries.;Review the importance of being able to check your own pulse for safety during independent exercise       Expected Outcomes Short Term: Able to explain why pulse checking is important during independent exercise;Long Term: Able to check pulse independently and accurately       Understanding of Exercise Prescription Yes       Intervention Provide education, explanation, and written materials on patient's individual exercise prescription       Expected Outcomes Short Term: Able to explain program exercise prescription;Long Term: Able to explain home exercise prescription to exercise independently                Exercise Goals Re-Evaluation :  Exercise Goals Re-Evaluation     Row Name 07/07/23 1014             Exercise Goal Re-Evaluation   Exercise Goals Review Able to understand and use rate of perceived exertion (RPE) scale;Able to understand and use Dyspnea scale       Comments Reviewed RPE  and dyspnea scale, THR and program prescription with pt today.  Pt voiced understanding and was given a copy of goals to take home.       Expected Outcomes Short: Use RPE daily to regulate intensity.  Long: Follow program prescription                 Discharge Exercise Prescription (Final Exercise Prescription Changes):  Exercise Prescription Changes - 07/07/23 1000       Response to Exercise   Blood Pressure (Admit) 130/56  Blood Pressure (Exercise) 148/74    Blood Pressure (Exit) 122/62    Heart Rate (Admit) 54 bpm    Heart Rate (Exercise) 75  bpm    Heart Rate (Exit) 57 bpm    Oxygen Saturation (Admit) 98 %    Oxygen Saturation (Exercise) 97 %    Rating of Perceived Exertion (Exercise) 12    Symptoms knee pain 5/10    Comments walk test results             Nutrition:  Target Goals: Understanding of nutrition guidelines, daily intake of sodium 1500mg , cholesterol 200mg , calories 30% from fat and 7% or less from saturated fats, daily to have 5 or more servings of fruits and vegetables.  Biometrics:  Pre Biometrics - 07/07/23 1014       Pre Biometrics   Height 5' (1.524 m)    Weight 155 lb 1.6 oz (70.4 kg)    Waist Circumference 33 inches    Hip Circumference 40 inches    Waist to Hip Ratio 0.83 %    BMI (Calculated) 30.29    Grip Strength 21.3 kg    Single Leg Stand 3.9 seconds              Nutrition Therapy Plan and Nutrition Goals:   Nutrition Assessments:  Nutrition Assessments - 07/07/23 0858       MEDFICTS Scores   Pre Score 3            MEDIFICTS Score Key: ?70 Need to make dietary changes  40-70 Heart Healthy Diet ? 40 Therapeutic Level Cholesterol Diet   Picture Your Plate Scores: <16 Unhealthy dietary pattern with much room for improvement. 41-50 Dietary pattern unlikely to meet recommendations for good health and room for improvement. 51-60 More healthful dietary pattern, with some room for improvement.  >60 Healthy dietary pattern, although there may be some specific behaviors that could be improved.    Nutrition Goals Re-Evaluation:   Nutrition Goals Discharge (Final Nutrition Goals Re-Evaluation):   Psychosocial: Target Goals: Acknowledge presence or absence of significant depression and/or stress, maximize coping skills, provide positive support system. Participant is able to verbalize types and ability to use techniques and skills needed for reducing stress and depression.  Initial Review & Psychosocial Screening:  Initial Psych Review & Screening - 07/07/23 0900        Initial Review   Current issues with None Identified      Family Dynamics   Good Support System? Yes    Comments Patient's friend and chuch family are her support.      Barriers   Psychosocial barriers to participate in program There are no identifiable barriers or psychosocial needs.      Screening Interventions   Interventions Encouraged to exercise;To provide support and resources with identified psychosocial needs;Provide feedback about the scores to participant    Expected Outcomes Short Term goal: Utilizing psychosocial counselor, staff and physician to assist with identification of specific Stressors or current issues interfering with healing process. Setting desired goal for each stressor or current issue identified.;Long Term Goal: Stressors or current issues are controlled or eliminated.;Short Term goal: Identification and review with participant of any Quality of Life or Depression concerns found by scoring the questionnaire.;Long Term goal: The participant improves quality of Life and PHQ9 Scores as seen by post scores and/or verbalization of changes             Quality of Life Scores:  Quality of Life - 07/07/23 1015  Quality of Life   Select Quality of Life      Quality of Life Scores   Health/Function Pre 29.14 %    Socioeconomic Pre 26 %    Psych/Spiritual Pre 30 %    Family Pre 25.5 %    GLOBAL Pre 28.26 %            Scores of 19 and below usually indicate a poorer quality of life in these areas.  A difference of  2-3 points is a clinically meaningful difference.  A difference of 2-3 points in the total score of the Quality of Life Index has been associated with significant improvement in overall quality of life, self-image, physical symptoms, and general health in studies assessing change in quality of life.  PHQ-9: Review Flowsheet       07/07/2023  Depression screen PHQ 2/9  Decreased Interest 0  Down, Depressed, Hopeless 0  PHQ - 2  Score 0  Altered sleeping 0  Tired, decreased energy 0  Change in appetite 0  Feeling bad or failure about yourself  0  Trouble concentrating 0  Moving slowly or fidgety/restless 0  Suicidal thoughts 0  PHQ-9 Score 0    Details           Interpretation of Total Score  Total Score Depression Severity:  1-4 = Minimal depression, 5-9 = Mild depression, 10-14 = Moderate depression, 15-19 = Moderately severe depression, 20-27 = Severe depression   Psychosocial Evaluation and Intervention:  Psychosocial Evaluation - 07/07/23 0903       Psychosocial Evaluation & Interventions   Interventions Relaxation education;Stress management education;Encouraged to exercise with the program and follow exercise prescription    Comments Patient was referred to cardiac rehab with DES x2 06/18/23. Her PHQ-9 score was 0. She denies any depression, anxiety or stressors in her life. 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 She has a daughter, 2 grandchildren and several great grand children. She says she does not have alot of contact with her family. Her slibings are deceased. She does have a lot of support from a close friend and several friends from her chruch and her church family. She has a $25 copayment which could be a barrier for her to complete the program. She is going to do 2 days/week due to the copayment. She also has OA in her right knee and she is not sure if any of the exercising will aggervate this. We ask that she will participate for at least a month and she said she will see how things go. She reports that her right arm is still tender from the catheterization and she started having vaginal bleeding since her stent. She addressed these issues with Randall An, PA at her follow up OV. She was told to use warm compresses on her arm and that the Brilinta could be causing  the bleeding. She has a history of uterine cancer treated with a partial hysterectomy several years ago. She is scheduled to see OBGYN later this month. Her main goal for the program is to get back to her normal health prior to her stent placement.    Expected Outcomes Patient will be able to complete at least a month of the program.    Continue Psychosocial Services  Follow up required by staff  Psychosocial Re-Evaluation:   Psychosocial Discharge (Final Psychosocial Re-Evaluation):   Vocational Rehabilitation: Provide vocational rehab assistance to qualifying candidates.   Vocational Rehab Evaluation & Intervention:  Vocational Rehab - 07/07/23 5462       Initial Vocational Rehab Evaluation & Intervention   Assessment shows need for Vocational Rehabilitation No      Vocational Rehab Re-Evaulation   Comments Patient has already retruned to work as a Comptroller for an elderly lady.             Education: Education Goals: Education classes will be provided on a weekly basis, covering required topics. Participant will state understanding/return demonstration of topics presented.  Learning Barriers/Preferences:  Learning Barriers/Preferences - 07/07/23 0858       Learning Barriers/Preferences   Learning Barriers None    Learning Preferences Audio             Education Topics: Hypertension, Hypertension Reduction -Define heart disease and high blood pressure. Discus how high blood pressure affects the body and ways to reduce high blood pressure.   Exercise and Your Heart -Discuss why it is important to exercise, the FITT principles of exercise, normal and abnormal responses to exercise, and how to exercise safely.   Angina -Discuss definition of angina, causes of angina, treatment of angina, and how to decrease risk of having angina.   Cardiac Medications -Review what the following cardiac medications are used for, how they affect the body, and  side effects that may occur when taking the medications.  Medications include Aspirin, Beta blockers, calcium channel blockers, ACE Inhibitors, angiotensin receptor blockers, diuretics, digoxin, and antihyperlipidemics.   Congestive Heart Failure -Discuss the definition of CHF, how to live with CHF, the signs and symptoms of CHF, and how keep track of weight and sodium intake.   Heart Disease and Intimacy -Discus the effect sexual activity has on the heart, how changes occur during intimacy as we age, and safety during sexual activity.   Smoking Cessation / COPD -Discuss different methods to quit smoking, the health benefits of quitting smoking, and the definition of COPD.   Nutrition I: Fats -Discuss the types of cholesterol, what cholesterol does to the heart, and how cholesterol levels can be controlled.   Nutrition II: Labels -Discuss the different components of food labels and how to read food label   Heart Parts/Heart Disease and PAD -Discuss the anatomy of the heart, the pathway of blood circulation through the heart, and these are affected by heart disease.   Stress I: Signs and Symptoms -Discuss the causes of stress, how stress may lead to anxiety and depression, and ways to limit stress.   Stress II: Relaxation -Discuss different types of relaxation techniques to limit stress.   Warning Signs of Stroke / TIA -Discuss definition of a stroke, what the signs and symptoms are of a stroke, and how to identify when someone is having stroke.   Knowledge Questionnaire Score:  Knowledge Questionnaire Score - 07/07/23 0858       Knowledge Questionnaire Score   Pre Score 19/24             Core Components/Risk Factors/Patient Goals at Admission:  Personal Goals and Risk Factors at Admission - 07/07/23 1015       Core Components/Risk Factors/Patient Goals on Admission    Weight Management Yes;Weight Loss    Intervention Weight Management: Develop a combined  nutrition and exercise program designed to reach desired caloric intake, while maintaining appropriate intake of nutrient and fiber, sodium  and fats, and appropriate energy expenditure required for the weight goal.;Weight Management: Provide education and appropriate resources to help participant work on and attain dietary goals.    Admit Weight 155 lb 1.6 oz (70.4 kg)    Goal Weight: Short Term 153 lb (69.4 kg)    Goal Weight: Long Term 150 lb (68 kg)    Expected Outcomes Short Term: Continue to assess and modify interventions until short term weight is achieved;Long Term: Adherence to nutrition and physical activity/exercise program aimed toward attainment of established weight goal;Weight Loss: Understanding of general recommendations for a balanced deficit meal plan, which promotes 1-2 lb weight loss per week and includes a negative energy balance of [id number] kcal/d;Understanding recommendations for meals to include 15-35% energy as protein, 25-35% energy from fat, 35-60% energy from carbohydrates, less than 200mg  of dietary cholesterol, 20-35 gm of total fiber daily;Understanding of distribution of calorie intake throughout the day with the consumption of 4-5 meals/snacks    Diabetes Yes    Intervention Provide education about proper nutrition, including hydration, and aerobic/resistive exercise prescription along with prescribed medications to achieve blood glucose in normal ranges: Fasting glucose 65-99 mg/dL    Expected Outcomes Short Term: Participant verbalizes understanding of the signs/symptoms and immediate care of hyper/hypoglycemia, proper foot care and importance of medication, aerobic/resistive exercise and nutrition plan for blood glucose control.;Long Term: Attainment of HbA1C < 7%.    Hypertension Yes    Intervention Provide education on lifestyle modifcations including regular physical activity/exercise, weight management, moderate sodium restriction and increased consumption of fresh  fruit, vegetables, and low fat dairy, alcohol moderation, and smoking cessation.;Monitor prescription use compliance.    Expected Outcomes Short Term: Continued assessment and intervention until BP is < 140/43mm HG in hypertensive participants. < 130/23mm HG in hypertensive participants with diabetes, heart failure or chronic kidney disease.;Long Term: Maintenance of blood pressure at goal levels.    Lipids Yes    Intervention Provide education and support for participant on nutrition & aerobic/resistive exercise along with prescribed medications to achieve LDL 70mg , HDL >40mg .    Expected Outcomes Short Term: Participant states understanding of desired cholesterol values and is compliant with medications prescribed. Participant is following exercise prescription and nutrition guidelines.;Long Term: Cholesterol controlled with medications as prescribed, with individualized exercise RX and with personalized nutrition plan. Value goals: LDL < 70mg , HDL > 40 mg.             Core Components/Risk Factors/Patient Goals Review:    Core Components/Risk Factors/Patient Goals at Discharge (Final Review):    ITP Comments:  ITP Comments     Row Name 07/14/23 1305 07/16/23 0830         ITP Comments First full day of exercise!  Patient was oriented to gym and equipment including functions, settings, policies, and procedures.  Patient's individual exercise prescription and treatment plan were reviewed.  All starting workloads were established based on the results of the 6 minute walk test done at initial orientation visit.  The plan for exercise progression was also introduced and progression will be customized based on patient's performance and goals. 30 day review completed. ITP sent to Dr. Dina Rich, Medical Director of Cardiac Rehab. Continue with ITP unless changes are made by physician.   New to program               Comments: 30 day review

## 2023-07-16 NOTE — Progress Notes (Signed)
Daily Session Note  Patient Details  Name: [name] MRN: [id number] Date of Birth: March 28, 1946 Referring Provider:   Flowsheet Row CARDIAC REHAB PHASE II ORIENTATION from 07/07/2023 in [facility] CARDIAC REHABILITATION  Referring Provider Dietrich Pates MD       Encounter Date: 07/16/2023  Check In:  Session Check In - 07/16/23 1025       Check-In   Supervising physician immediately available to respond to emergencies See telemetry face sheet for immediately available ER MD    Location [facility]    Staff Present Rodena Medin, RN, BSN;Hillary Troutman BSN, RN;Jessica [place], MA, RCEP, CCRP, CCET;Heather [place], [place], Exercise Physiologist    Virtual Visit No    Medication changes reported     No    Fall or balance concerns reported    No    Warm-up and Cool-down Performed on first and last piece of equipment    Resistance Training Performed Yes    VAD Patient? No    PAD/SET Patient? No      Pain Assessment   Currently in Pain? No/denies    Pain Score 0-No pain    Multiple Pain Sites No             Capillary Blood Glucose: Results for orders placed or performed during the hospital encounter of 07/16/23 (from the past 24 hour(s))  Glucose, capillary     Status: Abnormal   Collection Time: 07/16/23 11:04 AM  Result Value Ref Range   Glucose-Capillary 131 (H) 70 - 99 mg/dL      Social History   Tobacco Use  Smoking Status Never   Passive exposure: Never  Smokeless Tobacco Never    Goals Met:  Independence with exercise equipment Exercise tolerated well No report of concerns or symptoms today Strength training completed today  Goals Unmet:  Not Applicable  Comments: Pt able to follow exercise prescription today without complaint.  Will continue to monitor for progression.    Dr. Dina Rich is Medical Director for [facility] Cardiac Rehab

## 2023-07-18 ENCOUNTER — Encounter (HOSPITAL_COMMUNITY)
Admission: RE | Admit: 2023-07-18 | Discharge: 2023-07-18 | Disposition: A | Discharge status: Home / Self Care | Payer: Medicare PPO | Source: Ambulatory Visit | Attending: Internal Medicine | Admitting: Internal Medicine

## 2023-07-18 DIAGNOSIS — Z955 Presence of coronary angioplasty implant and graft: Secondary | ICD-10-CM | POA: Diagnosis not present

## 2023-07-18 DIAGNOSIS — Z48812 Encounter for surgical aftercare following surgery on the circulatory system: Secondary | ICD-10-CM | POA: Diagnosis not present

## 2023-07-18 DIAGNOSIS — N939 Abnormal uterine and vaginal bleeding, unspecified: Secondary | ICD-10-CM | POA: Diagnosis not present

## 2023-07-18 LAB — GLUCOSE, CAPILLARY
Glucose-Capillary: 138 mg/dL — ABNORMAL HIGH (ref 70–99)
Glucose-Capillary: 93 mg/dL (ref 70–99)

## 2023-07-18 NOTE — Progress Notes (Signed)
Daily Session Note  Patient Details  Name: [name] MRN: [id number] Date of Birth: 1946/07/04 Referring Provider:   Flowsheet Row CARDIAC REHAB PHASE II ORIENTATION from 07/07/2023 in [facility] CARDIAC REHABILITATION  Referring Provider Dietrich Pates MD       Encounter Date: 07/18/2023  Check In:  Session Check In - 07/18/23 1045       Check-In   Supervising physician immediately available to respond to emergencies See telemetry face sheet for immediately available ER MD    Location [facility]    Staff Present Ross Ludwig, BS, Exercise Physiologist;Olamae Ferrara Laural Benes, RN, Pleas Koch, RN, BSN    Virtual Visit No    Medication changes reported     No    Fall or balance concerns reported    No    Warm-up and Cool-down Performed on first and last piece of equipment    Resistance Training Performed Yes    VAD Patient? No    PAD/SET Patient? No      Pain Assessment   Currently in Pain? No/denies    Pain Score 0-No pain    Multiple Pain Sites No             Capillary Blood Glucose: Results for orders placed or performed during the hospital encounter of 07/18/23 (from the past 24 hour(s))  Glucose, capillary     Status: Abnormal   Collection Time: 07/18/23 10:48 AM  Result Value Ref Range   Glucose-Capillary 138 (H) 70 - 99 mg/dL      Social History   Tobacco Use  Smoking Status Never   Passive exposure: Never  Smokeless Tobacco Never    Goals Met:  Independence with exercise equipment Exercise tolerated well No report of concerns or symptoms today Strength training completed today  Goals Unmet:  Not Applicable  Comments: Pt able to follow exercise prescription today without complaint.  Will continue to monitor for progression.    Dr. Dina Rich is Medical Director for [facility] Cardiac Rehab

## 2023-07-21 ENCOUNTER — Encounter (HOSPITAL_COMMUNITY): Payer: Medicare PPO

## 2023-07-21 ENCOUNTER — Encounter (HOSPITAL_COMMUNITY)
Admission: RE | Admit: 2023-07-21 | Discharge: 2023-07-21 | Disposition: A | Discharge status: Home / Self Care | Payer: Medicare PPO | Source: Ambulatory Visit | Attending: Internal Medicine | Admitting: Internal Medicine

## 2023-07-21 DIAGNOSIS — N939 Abnormal uterine and vaginal bleeding, unspecified: Secondary | ICD-10-CM | POA: Diagnosis not present

## 2023-07-21 DIAGNOSIS — Z48812 Encounter for surgical aftercare following surgery on the circulatory system: Secondary | ICD-10-CM | POA: Diagnosis not present

## 2023-07-21 DIAGNOSIS — Z955 Presence of coronary angioplasty implant and graft: Secondary | ICD-10-CM

## 2023-07-21 NOTE — Progress Notes (Signed)
Daily Session Note  Patient Details  Name: [name] MRN: [id number] Date of Birth: 1946/04/11 Referring Provider:   Flowsheet Row CARDIAC REHAB PHASE II ORIENTATION from 07/07/2023 in [facility] CARDIAC REHABILITATION  Referring Provider Dietrich Pates MD       Encounter Date: 07/21/2023  Check In:  Session Check In - 07/21/23 1045       Check-In   Supervising physician immediately available to respond to emergencies See telemetry face sheet for immediately available ER MD    Location [facility]    Staff Present Rodena Medin, RN, Pleas Koch, RN, BSN    Virtual Visit No    Medication changes reported     No    Fall or balance concerns reported    No    Warm-up and Cool-down Performed on first and last piece of equipment    Resistance Training Performed Yes    VAD Patient? No    PAD/SET Patient? No      Pain Assessment   Currently in Pain? No/denies    Pain Score 0-No pain    Multiple Pain Sites No             Capillary Blood Glucose: No results found for this or any previous visit (from the past 24 hour(s)).    Social History   Tobacco Use  Smoking Status Never   Passive exposure: Never  Smokeless Tobacco Never    Goals Met:  Independence with exercise equipment Exercise tolerated well No report of concerns or symptoms today Strength training completed today  Goals Unmet:  Not Applicable  Comments: Pt able to follow exercise prescription today without complaint.  Will continue to monitor for progression.    Dr. Dina Rich is Medical Director for [facility] Cardiac Rehab

## 2023-07-23 ENCOUNTER — Encounter: Payer: Self-pay | Admitting: Adult Health

## 2023-07-23 ENCOUNTER — Other Ambulatory Visit (HOSPITAL_COMMUNITY)
Admission: RE | Admit: 2023-07-23 | Discharge: 2023-07-23 | Disposition: A | Discharge status: Home / Self Care | Payer: Medicare PPO | Source: Ambulatory Visit | Attending: Adult Health | Admitting: Adult Health

## 2023-07-23 ENCOUNTER — Encounter (HOSPITAL_COMMUNITY): Payer: Medicare PPO

## 2023-07-23 ENCOUNTER — Ambulatory Visit: Payer: Medicare PPO | Admitting: Adult Health

## 2023-07-23 VITALS — BP 126/77 | HR 56 | Ht 60.5 in | Wt 154.0 lb

## 2023-07-23 DIAGNOSIS — Z90710 Acquired absence of both cervix and uterus: Secondary | ICD-10-CM | POA: Diagnosis not present

## 2023-07-23 DIAGNOSIS — N939 Abnormal uterine and vaginal bleeding, unspecified: Secondary | ICD-10-CM | POA: Diagnosis not present

## 2023-07-23 LAB — POCT WET PREP (WET MOUNT)
Clue Cells Wet Prep Whiff POC: NEGATIVE
Trichomonas Wet Prep HPF POC: ABSENT
WBC, Wet Prep HPF POC: POSITIVE

## 2023-07-23 NOTE — Progress Notes (Signed)
  Subjective:     Patient ID: [name], female   DOB: [birth year], 77 y.o.   MRN: [id number]  HPI [name] is a 77 year old black female,single, sp hysterectomy, in complaining of vaginal bleeding for about a month, since starting brilinta.  She says Dr Kate Sable did her hysterectomy years ago for ? Cancer, does not know what it was.   PCP is Dr Sherwood Gambler.  Review of Systems +vaginal bleeding Reviewed past medical,surgical, social and family history. Reviewed medications and allergies.     Objective:   Physical Exam BP 126/77 (BP Location: Left Arm, Patient Position: Sitting, Cuff Size: Normal)   Pulse (!) 56   Ht 5' 0.5" (1.537 m)   Wt 154 lb (69.9 kg)   BMI 29.58 kg/m     Skin warm and dry. Lungs: clear to ausculation bilaterally. Cardiovascular: regular rate and rhythm.  Pelvic: external genitalia is normal in appearance no lesions, vagina: pale, +pink discharge, without any odor, has cobblestone, friable lesions at vaginal cuff from about 9-12 0'clock, Dr Charlotta Newton in for co exam and she took several biopsies with Tischler biopsy, and Monsel's applied, pt gave verbal consent for biopsy,urethra has no lesions or masses noted, cervix and uterus are absent,adnexa: no masses or tenderness noted. Bladder is non tender and no masses felt.  Wet prep:+WBC and RBCs Fall risk is low Examination chaperoned by Malachy Mood LPN  Assessment:     1. Vaginal bleeding +vaginal bleeding for 1 month Biopsies obtained and sent to pathology to rule cancer - Surgical pathology( [facility]/ [facility]) - POCT Wet Prep [facility])  2. S/P hysterectomy Will try to request records form hysterectomy  with Dr Kate Sable, and path report     Plan:     Follow up with me in 9 days to review pathology report in detail

## 2023-07-24 LAB — SURGICAL PATHOLOGY

## 2023-07-25 ENCOUNTER — Encounter (HOSPITAL_COMMUNITY): Payer: Medicare PPO

## 2023-07-25 ENCOUNTER — Telehealth: Payer: Self-pay | Admitting: Adult Health

## 2023-07-25 MED ORDER — METRONIDAZOLE 0.75 % VA GEL: 1.0000 | Freq: Every day | VAGINAL | 0 refills | Status: DC

## 2023-07-25 NOTE — Telephone Encounter (Signed)
Pt aware no cancer on vaginal biopsy but acute vaginitis, will rx metrogel and recheck next week at appt

## 2023-07-28 ENCOUNTER — Encounter (HOSPITAL_COMMUNITY)
Admission: RE | Admit: 2023-07-28 | Discharge: 2023-07-28 | Disposition: A | Discharge status: Home / Self Care | Payer: Medicare PPO | Source: Ambulatory Visit | Attending: Internal Medicine | Admitting: Internal Medicine

## 2023-07-28 ENCOUNTER — Encounter (HOSPITAL_COMMUNITY): Payer: Medicare PPO

## 2023-07-28 DIAGNOSIS — N939 Abnormal uterine and vaginal bleeding, unspecified: Secondary | ICD-10-CM | POA: Diagnosis not present

## 2023-07-28 DIAGNOSIS — Z955 Presence of coronary angioplasty implant and graft: Secondary | ICD-10-CM

## 2023-07-28 DIAGNOSIS — Z48812 Encounter for surgical aftercare following surgery on the circulatory system: Secondary | ICD-10-CM | POA: Diagnosis not present

## 2023-07-28 NOTE — Progress Notes (Signed)
Daily Session Note  Patient Details  Name: [name] MRN: [id number] Date of Birth: 07/26/1946 Referring Provider:   Flowsheet Row CARDIAC REHAB PHASE II ORIENTATION from 07/07/2023 in [facility] CARDIAC REHABILITATION  Referring Provider Dietrich Pates MD       Encounter Date: 07/28/2023  Check In:  Session Check In - 07/28/23 1045       Check-In   Supervising physician immediately available to respond to emergencies See telemetry face sheet for immediately available ER MD    Location [facility]    Staff Present Rodena Medin, RN, BSN;Jessica Juanetta [place], MA, RCEP, CCRP, CCET;Heather Fredric [place], [place], Exercise Physiologist    Virtual Visit No    Medication changes reported     No    Fall or balance concerns reported    No    Warm-up and Cool-down Performed on first and last piece of equipment    Resistance Training Performed Yes    VAD Patient? No    PAD/SET Patient? No      Pain Assessment   Currently in Pain? No/denies    Pain Score 0-No pain    Multiple Pain Sites No             Capillary Blood Glucose: No results found for this or any previous visit (from the past 24 hour(s)).   Exercise Prescription Changes - 07/28/23 1100       Home Exercise Plan   Plans to continue exercise at Home (comment)   walking, stepper/pedal machine   Frequency Add 2 additional days to program exercise sessions.    Initial Home Exercises Provided 07/28/23      Oxygen   Maintain Oxygen Saturation 88% or higher             Social History   Tobacco Use  Smoking Status Never   Passive exposure: Never  Smokeless Tobacco Never    Goals Met:  Independence with exercise equipment Exercise tolerated well No report of concerns or symptoms today Strength training completed today  Goals Unmet:  Not Applicable  Comments: Pt able to follow exercise prescription today without complaint.  Will continue to monitor for progression.    Dr. Dina Rich is Medical  Director for [facility] Cardiac Rehab

## 2023-07-28 NOTE — Progress Notes (Signed)
Reviewed home exercise with pt today.  Pt plans to walk and use pedal/stepper machine at home for exercise.  Reviewed THR, pulse, RPE, sign and symptoms, pulse oximetery and when to call 911 or MD.  Also discussed weather considerations and indoor options.  Pt voiced understanding.

## 2023-07-30 ENCOUNTER — Encounter (HOSPITAL_COMMUNITY): Payer: Medicare PPO

## 2023-07-30 ENCOUNTER — Encounter (HOSPITAL_COMMUNITY)
Admission: RE | Admit: 2023-07-30 | Discharge: 2023-07-30 | Disposition: A | Discharge status: Home / Self Care | Payer: Medicare PPO | Source: Ambulatory Visit | Attending: Internal Medicine | Admitting: Internal Medicine

## 2023-07-30 DIAGNOSIS — Z955 Presence of coronary angioplasty implant and graft: Secondary | ICD-10-CM | POA: Diagnosis not present

## 2023-07-30 DIAGNOSIS — N939 Abnormal uterine and vaginal bleeding, unspecified: Secondary | ICD-10-CM | POA: Diagnosis not present

## 2023-07-30 DIAGNOSIS — Z48812 Encounter for surgical aftercare following surgery on the circulatory system: Secondary | ICD-10-CM | POA: Diagnosis not present

## 2023-07-30 NOTE — Progress Notes (Signed)
Daily Session Note  Patient Details  Name: [name] MRN: [id number] Date of Birth: 1946/08/10 Referring Provider:   Flowsheet Row CARDIAC REHAB PHASE II ORIENTATION from 07/07/2023 in [facility] CARDIAC REHABILITATION  Referring Provider Dietrich Pates MD       Encounter Date: 07/30/2023  Check In:  Session Check In - 07/30/23 1020       Check-In   Supervising physician immediately available to respond to emergencies See telemetry face sheet for immediately available MD    Location [facility]    Staff Present Ross Ludwig, BS, Exercise Physiologist;Florella Mcneese [address] BSN, RN;Jessica [place], MA, RCEP, CCRP, Dow Adolph, RN, BSN    Virtual Visit No    Medication changes reported     No    Fall or balance concerns reported    No    Tobacco Cessation No Change    Warm-up and Cool-down Performed on first and last piece of equipment    Resistance Training Performed Yes    VAD Patient? No    PAD/SET Patient? No      Pain Assessment   Currently in Pain? No/denies    Pain Score 0-No pain    Multiple Pain Sites No             Capillary Blood Glucose: No results found for this or any previous visit (from the past 24 hour(s)).    Social History   Tobacco Use  Smoking Status Never   Passive exposure: Never  Smokeless Tobacco Never    Goals Met:  Independence with exercise equipment Exercise tolerated well No report of concerns or symptoms today Strength training completed today  Goals Unmet:  Not Applicable  Comments: Marland KitchenMarland KitchenPt able to follow exercise prescription today without complaint.  Will continue to monitor for progression.    Dr. Dina Rich is Medical Director for [facility] Cardiac Rehab

## 2023-08-01 ENCOUNTER — Encounter: Payer: Self-pay | Admitting: Adult Health

## 2023-08-01 ENCOUNTER — Ambulatory Visit (INDEPENDENT_AMBULATORY_CARE_PROVIDER_SITE_OTHER): Payer: Medicare PPO | Admitting: Adult Health

## 2023-08-01 ENCOUNTER — Encounter (HOSPITAL_COMMUNITY): Payer: Medicare PPO

## 2023-08-01 VITALS — BP 111/68 | HR 59 | Ht 60.5 in | Wt 154.0 lb

## 2023-08-01 DIAGNOSIS — N939 Abnormal uterine and vaginal bleeding, unspecified: Secondary | ICD-10-CM

## 2023-08-01 DIAGNOSIS — R140 Abdominal distension (gaseous): Secondary | ICD-10-CM | POA: Diagnosis not present

## 2023-08-01 DIAGNOSIS — Z90710 Acquired absence of both cervix and uterus: Secondary | ICD-10-CM

## 2023-08-01 DIAGNOSIS — N760 Acute vaginitis: Secondary | ICD-10-CM

## 2023-08-01 NOTE — Progress Notes (Signed)
  Subjective:     Patient ID: [name], female   DOB: [birth year], 77 y.o.   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  The biopsy showed acute vaginitis and was treated with Metrogel, denies any more bleeding. She is feeling bloated at times and has to pee more often. She had CT 11/28/22 showed mild steatosis.  Had GB out in 2008.   PCP is Dr Sherwood Gambler  Review of Systems Denies any vaginal bleeding +bloating and has to pee more often(on Jardiance now)   Reviewed past medical,surgical, social and family history. Reviewed medications and allergies.  Objective:   Physical Exam BP 111/68 (BP Location: Left Arm, Patient Position: Sitting, Cuff Size: Normal)   Pulse (!) 59   Ht 5' 0.5" (1.537 m)   Wt 154 lb (69.9 kg)   BMI 29.58 kg/m     Skin warm and dry. Abdomen is soft, no HSM, mildly tender epigastric area. Pelvic: external genitalia is normal in appearance no lesions, vagina:pale, vaginal cuff is smooth today, Dr Charlotta Newton for co exam,urethra has no lesions or masses noted, cervix and uterus are absent, adnexa: no masses or tenderness noted. Bladder is non tender and no masses felt.  Fall risk is low  Upstream - 08/01/23 0943       Pregnancy Intention Screening   Does the patient want to become pregnant in the next year? N/A    Does the patient's partner want to become pregnant in the next year? N/A    Would the patient like to discuss contraceptive options today? N/A      Contraception Wrap Up   Current Method Female Sterilization   hyst   End Method Female Sterilization   hyst   Contraception Counseling Provided No            Examination chaperoned by Malachy Mood LPN  Assessment:     1. Acute vaginitis Has resolved with Metrogel   2. 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 Vaginal  bleeding Resolved   4. Bloating Follow up with PCP    Plan:     Follow up prn

## 2023-08-04 ENCOUNTER — Encounter (HOSPITAL_COMMUNITY): Payer: Medicare PPO

## 2023-08-06 ENCOUNTER — Encounter (HOSPITAL_COMMUNITY)
Admission: RE | Admit: 2023-08-06 | Discharge: 2023-08-06 | Disposition: A | Discharge status: Home / Self Care | Payer: Medicare PPO | Source: Ambulatory Visit | Attending: Internal Medicine | Admitting: Internal Medicine

## 2023-08-06 ENCOUNTER — Encounter (HOSPITAL_COMMUNITY): Payer: Medicare PPO

## 2023-08-06 DIAGNOSIS — Z955 Presence of coronary angioplasty implant and graft: Secondary | ICD-10-CM | POA: Insufficient documentation

## 2023-08-06 NOTE — Progress Notes (Signed)
Daily Session Note  Patient Details  Name: [name] MRN: [id number] Date of Birth: 05/12/1946 Referring Provider:   Flowsheet Row CARDIAC REHAB PHASE II ORIENTATION from 07/07/2023 in [facility] CARDIAC REHABILITATION  Referring Provider Dietrich Pates MD       Encounter Date: 08/06/2023  Check In:  Session Check In - 08/06/23 1100       Check-In   Supervising physician immediately available to respond to emergencies See telemetry face sheet for immediately available MD    Location [facility]    Staff Present Ross Ludwig, BS, Exercise Physiologist;Jessica Juanetta Gosling, MA, RCEP, CCRP, CCET    Virtual Visit No    Medication changes reported     No    Fall or balance concerns reported    No    Tobacco Cessation No Change    Warm-up and Cool-down Performed on first and last piece of equipment    Resistance Training Performed Yes    VAD Patient? No    PAD/SET Patient? No      Pain Assessment   Currently in Pain? No/denies    Pain Score 0-No pain    Multiple Pain Sites No             Capillary Blood Glucose: No results found for this or any previous visit (from the past 24 hour(s)).    Social History   Tobacco Use  Smoking Status Never   Passive exposure: Never  Smokeless Tobacco Never    Goals Met:  Independence with exercise equipment Exercise tolerated well No report of concerns or symptoms today Strength training completed today  Goals Unmet:  Not Applicable  Comments: Pt able to follow exercise prescription today without complaint.  Will continue to monitor for progression.    Dr. Dina Rich is Medical Director for [facility] Cardiac Rehab

## 2023-08-08 ENCOUNTER — Other Ambulatory Visit: Payer: Self-pay

## 2023-08-08 ENCOUNTER — Encounter (HOSPITAL_COMMUNITY): Payer: Medicare PPO

## 2023-08-11 ENCOUNTER — Encounter (HOSPITAL_COMMUNITY)
Admission: RE | Admit: 2023-08-11 | Discharge: 2023-08-11 | Disposition: A | Discharge status: Home / Self Care | Payer: Medicare PPO | Source: Ambulatory Visit | Attending: Internal Medicine

## 2023-08-11 ENCOUNTER — Encounter (HOSPITAL_COMMUNITY): Payer: Medicare PPO

## 2023-08-11 DIAGNOSIS — Z955 Presence of coronary angioplasty implant and graft: Secondary | ICD-10-CM | POA: Diagnosis not present

## 2023-08-13 ENCOUNTER — Encounter (HOSPITAL_COMMUNITY): Payer: Self-pay | Admitting: *Deleted

## 2023-08-13 ENCOUNTER — Encounter (HOSPITAL_COMMUNITY)
Admission: RE | Admit: 2023-08-13 | Discharge: 2023-08-13 | Disposition: A | Discharge status: Home / Self Care | Payer: Medicare PPO | Source: Ambulatory Visit | Attending: Internal Medicine | Admitting: Internal Medicine

## 2023-08-13 ENCOUNTER — Encounter (HOSPITAL_COMMUNITY): Payer: Medicare PPO

## 2023-08-13 DIAGNOSIS — Z955 Presence of coronary angioplasty implant and graft: Secondary | ICD-10-CM

## 2023-08-13 NOTE — Progress Notes (Signed)
Cardiac Individual Treatment Plan  Patient Details  Name: [name] MRN: [id number] Date of Birth: [birth year] Referring Provider:   Flowsheet Row CARDIAC REHAB PHASE II ORIENTATION from 07/07/2023 in [facility] CARDIAC REHABILITATION  Referring Provider Dietrich Pates MD       Initial Encounter Date:  Flowsheet Row CARDIAC REHAB PHASE II ORIENTATION from 07/07/2023 in [place] CARDIAC REHABILITATION  Date 07/07/23       Visit Diagnosis: Status post coronary artery stent placement  Patient's Home Medications on Admission:  Current Outpatient Medications:    amLODipine (NORVASC) 10 MG tablet, Take 1 tablet (10 mg total) by mouth daily. (Patient taking differently: Take 10 mg by mouth at bedtime.), Disp: 30 tablet, Rfl: 2   aspirin EC 81 MG tablet, Take 81 mg by mouth every morning., Disp: , Rfl:    calcium elemental as carbonate (BARIATRIC TUMS ULTRA) 400 MG chewable tablet, Chew 1-2 tablets by mouth 2 (two) times daily as needed for indigestion or heartburn., Disp: , Rfl:    Cholecalciferol (VITAMIN D) 125 MCG (5000 UT) CAPS, Take 1 tablet by mouth in the morning., Disp: , Rfl:    empagliflozin (JARDIANCE) 25 MG TABS tablet, Take 25 mg by mouth in the morning., Disp: , Rfl:    JANUMET XR [phone] MG TB24, Take 1 tablet by mouth in the morning., Disp: , Rfl:    lisinopril (PRINIVIL,ZESTRIL) 20 MG tablet, Take 20 mg by mouth in the morning., Disp: , Rfl:    metroNIDAZOLE (METROGEL) 0.75 % vaginal gel, Place 1 Applicatorful vaginally at bedtime., Disp: 70 g, Rfl: 0   nitroGLYCERIN (NITROSTAT) 0.4 MG SL tablet, Place 1 tablet (0.4 mg total) under the tongue every 5 (five) minutes as needed for chest pain., Disp: 25 tablet, Rfl: 3   Polyethyl Glycol-Propyl Glycol (SYSTANE OP), Place 1 drop into both eyes 2 (two) times daily., Disp: , Rfl:    rosuvastatin (CRESTOR) 20 MG tablet, Take 1 tablet (20 mg total) by mouth daily., Disp: 90 tablet, Rfl: 3   ticagrelor (BRILINTA) 90 MG TABS tablet,  Take 1 tablet (90 mg total) by mouth 2 (two) times daily., Disp: 180 tablet, Rfl: 2  Past Medical History: Past Medical History:  Diagnosis Date   Arthritis    CAD (coronary artery disease)    a. s/p BMS to LCx in 2008 b. DESx1 to mid-LAD and DESx2 to D1 in 06/2023   Cancer (HCC)    Diabetes mellitus (HCC)    type 2 x  1 or 2 yrs   Fatty liver    GERD (gastroesophageal reflux disease)    High cholesterol    Hypertension    Seasonal allergies     Tobacco Use: Social History   Tobacco Use  Smoking Status Never   Passive exposure: Never  Smokeless Tobacco Never    Labs: Review Flowsheet  More data may exist      Latest Ref Rng & Units 09/12/2007 05/11/2013 10/22/2016 11/30/2021 06/18/2023  Labs for ITP Cardiac and Pulmonary Rehab  Cholestrol 0 - 200 mg/dL 846        ATP III CLASSIFICATION:  <200     mg/dL   Desirable  962-952  mg/dL   Borderline High  >=841    mg/dL   High  324  401  027  -  LDL (calc) 0 - 99 mg/dL 253        Total Cholesterol/HDL:CHD Risk Coronary Heart Disease Risk Table  Men   Women  1/2 Average Risk   3.4   3.3  66  147  59  -  HDL-C >40 mg/dL 32  43  45  38  -  Trlycerides <150 mg/dL 161  096  045  56  -  Hemoglobin A1c 4.8 - 5.6 % 6.3 (NOTE)  Therapeutic target for the treatment of diabetes mellitus patients is <7% HbA1c.  American Diabetes Association Diabetes Care 2002;25:S33-S49.  - - 7.2  -  PH, Arterial 7.35 - 7.45 - - - - 7.390   PCO2 arterial 32 - 48 mmHg - - - - 37.1   Bicarbonate 20.0 - 28.0 mmol/L 20.0 - 28.0 mmol/L - - - - 24.6  24.4  22.4   TCO2 22 - 32 mmol/L 22 - 32 mmol/L - - - - 26  26  24    Acid-base deficit 0.0 - 2.0 mmol/L 0.0 - 2.0 mmol/L - - - - 1.0  1.0  2.0   O2 Saturation % % - - - - 70  71  97     Details       Multiple values from one day are sorted in reverse-chronological order         Capillary Blood Glucose: Lab Results  Component Value Date   GLUCAP 93 07/18/2023   GLUCAP 138 (H)  07/18/2023   GLUCAP 104 (H) 07/16/2023   GLUCAP 131 (H) 07/16/2023   GLUCAP 164 (H) 07/14/2023     Exercise Target Goals: Exercise Program Goal: Individual exercise prescription set using results from initial 6 min walk test and THRR while considering  patient's activity barriers and safety.   Exercise Prescription Goal: Starting with aerobic activity 30 plus minutes a day, 3 days per week for initial exercise prescription. Provide home exercise prescription and guidelines that participant acknowledges understanding prior to discharge.  Activity Barriers & Risk Stratification:  Activity Barriers & Cardiac Risk Stratification - 07/07/23 1011       Activity Barriers & Cardiac Risk Stratification   Activity Barriers Arthritis;Balance Concerns;Deconditioning;Muscular Weakness;Joint Problems   chronic knee pain from arthritisi   Cardiac Risk Stratification Moderate             6 Minute Walk:  6 Minute Walk     Row Name 07/07/23 1010         6 Minute Walk   Phase Initial     Distance 964 feet     Walk Time 6 minutes     # of Rest Breaks 0     MPH 1.83     METS 1.57     RPE 12     VO2 Peak 5.49     Symptoms Yes (comment)     Comments knee pain 5/10     Resting HR 54 bpm     Resting BP 130/56     Resting Oxygen Saturation  98 %     Exercise Oxygen Saturation  during 6 min walk 97 %     Max Ex. HR 75 bpm     Max Ex. BP 148/74     2 Minute Post BP 122/62              Oxygen Initial Assessment:   Oxygen Re-Evaluation:   Oxygen Discharge (Final Oxygen Re-Evaluation):   Initial Exercise Prescription:  Initial Exercise Prescription - 07/07/23 1000       Date of Initial Exercise RX and Referring Provider   Date 07/07/23    Referring Provider  Dietrich Pates MD      Oxygen   Maintain Oxygen Saturation 88% or higher      Treadmill   MPH 1.6    Grade 0.5    Minutes 15    METs 2.33      NuStep   Level 1    SPM 80    Minutes 15    METs 2       Prescription Details   Frequency (times per week) 2    Duration Progress to 30 minutes of continuous aerobic without signs/symptoms of physical distress      Intensity   THRR 40-80% of Max Heartrate 90-126    Ratings of Perceived Exertion 11-13    Perceived Dyspnea 0-4      Progression   Progression Continue to progress workloads to maintain intensity without signs/symptoms of physical distress.      Resistance Training   Training Prescription Yes    Weight 3 lb    Reps 10-15             Perform Capillary Blood Glucose checks as needed.  Exercise Prescription Changes:   Exercise Prescription Changes     Row Name 07/07/23 1000 07/16/23 1200 07/28/23 1100 08/11/23 1300       Response to Exercise   Blood Pressure (Admit) 130/56 120/50 -- 128/56    Blood Pressure (Exercise) 148/74 -- -- --    Blood Pressure (Exit) 122/62 116/60 -- 110/52    Heart Rate (Admit) 54 bpm 57 bpm -- 61 bpm    Heart Rate (Exercise) 75 bpm 85 bpm -- 89 bpm    Heart Rate (Exit) 57 bpm 65 bpm -- 66 bpm    Oxygen Saturation (Admit) 98 % -- -- --    Oxygen Saturation (Exercise) 97 % -- -- --    Rating of Perceived Exertion (Exercise) 12 12 -- 12    Symptoms knee pain 5/10 -- -- --    Comments walk test results -- -- --    Duration -- Continue with 30 min of aerobic exercise without signs/symptoms of physical distress. -- Continue with 30 min of aerobic exercise without signs/symptoms of physical distress.    Intensity -- THRR unchanged -- THRR unchanged      Progression   Progression -- Continue to progress workloads to maintain intensity without signs/symptoms of physical distress. -- Continue to progress workloads to maintain intensity without signs/symptoms of physical distress.      Resistance Training   Training Prescription -- Yes -- Yes    Weight -- 2 -- 1    Reps -- 10-15 -- 10-15      NuStep   Level -- 1 -- 2    SPM -- 50 -- 93    Minutes -- 15 -- 15    METs -- 2 -- 2      Track    Laps -- 21 -- 23    Minutes -- 15 -- 15      Home Exercise Plan   Plans to continue exercise at -- -- Home (comment)  walking, stepper/pedal machine Home (comment)    Frequency -- -- Add 2 additional days to program exercise sessions. Add 2 additional days to program exercise sessions.    Initial Home Exercises Provided -- -- 07/28/23 --      Oxygen   Maintain Oxygen Saturation -- -- 88% or higher 88% or higher             Exercise Comments:  Exercise Comments     Row Name 07/14/23 1305           Exercise Comments First full day of exercise!  Patient was oriented to gym and equipment including functions, settings, policies, and procedures.  Patient's individual exercise prescription and treatment plan were reviewed.  All starting workloads were established based on the results of the 6 minute walk test done at initial orientation visit.  The plan for exercise progression was also introduced and progression will be customized based on patient's performance and goals.                Exercise Goals and Review:   Exercise Goals     Row Name 07/07/23 1013             Exercise Goals   Increase Physical Activity Yes       Intervention Provide advice, education, support and counseling about physical activity/exercise needs.;Develop an individualized exercise prescription for aerobic and resistive training based on initial evaluation findings, risk stratification, comorbidities and participant's personal goals.       Expected Outcomes Short Term: Attend rehab on a regular basis to increase amount of physical activity.;Long Term: Add in home exercise to make exercise part of routine and to increase amount of physical activity.;Long Term: Exercising regularly at least 3-5 days a week.       Increase Strength and Stamina Yes       Intervention Provide advice, education, support and counseling about physical activity/exercise needs.;Develop an individualized exercise  prescription for aerobic and resistive training based on initial evaluation findings, risk stratification, comorbidities and participant's personal goals.       Expected Outcomes Short Term: Increase workloads from initial exercise prescription for resistance, speed, and METs.;Short Term: Perform resistance training exercises routinely during rehab and add in resistance training at home;Long Term: Improve cardiorespiratory fitness, muscular endurance and strength as measured by increased METs and functional capacity ( )       Able to understand and use rate of perceived exertion (RPE) scale Yes       Intervention Provide education and explanation on how to use RPE scale       Expected Outcomes Short Term: Able to use RPE daily in rehab to express subjective intensity level;Long Term:  Able to use RPE to guide intensity level when exercising independently       Able to understand and use Dyspnea scale Yes       Intervention Provide education and explanation on how to use Dyspnea scale       Expected Outcomes Long Term: Able to use Dyspnea scale to guide intensity level when exercising independently;Short Term: Able to use Dyspnea scale daily in rehab to express subjective sense of shortness of breath during exertion       Knowledge and understanding of Target Heart Rate Range (THRR) Yes       Intervention Provide education and explanation of THRR including how the numbers were predicted and where they are located for reference       Expected Outcomes Short Term: Able to state/look up THRR;Short Term: Able to use daily as guideline for intensity in rehab;Long Term: Able to use THRR to govern intensity when exercising independently       Able to check pulse independently Yes       Intervention Provide education and demonstration on how to check pulse in carotid and radial arteries.;Review the importance of being able to check your own pulse for safety during  independent exercise       Expected Outcomes  Short Term: Able to explain why pulse checking is important during independent exercise;Long Term: Able to check pulse independently and accurately       Understanding of Exercise Prescription Yes       Intervention Provide education, explanation, and written materials on patient's individual exercise prescription       Expected Outcomes Short Term: Able to explain program exercise prescription;Long Term: Able to explain home exercise prescription to exercise independently                Exercise Goals Re-Evaluation :  Exercise Goals Re-Evaluation     Row Name 07/07/23 1014 07/17/23 0939 07/28/23 1107         Exercise Goal Re-Evaluation   Exercise Goals Review Able to understand and use rate of perceived exertion (RPE) scale;Able to understand and use Dyspnea scale Increase Physical Activity;Understanding of Exercise Prescription;Increase Strength and Stamina Increase Physical Activity;Increase Strength and Stamina;Understanding of Exercise Prescription     Comments Reviewed RPE  and dyspnea scale, THR and program prescription with pt today.  Pt voiced understanding and was given a copy of goals to take home. [name] has not increased her workloads. She walked the track instead of the treadmill this class due to not feeling great and enjoyed it better. Will continue to monitor and prgress asable [name] is doing well in rehab.  She is only planning to do a month in rehab as she feels confident exercising at home and insurance copays. Reviewed home exercise with pt today.  Pt plans to walk and use pedal/stepper machine at home for exercise.  Reviewed THR, pulse, RPE, sign and symptoms, pulse oximetery and when to call 911 or MD.  Also discussed weather considerations and indoor options.  Pt voiced understanding.     Expected Outcomes Short: Use RPE daily to regulate intensity.  Long: Follow program prescription Short term: increase SPM on the stepper to achieve greater then 80 SPM   long term :  continue to attend caridac rehab sessions Short: Continue to add in exercise at home Long: Continue to improve stamina               Discharge Exercise Prescription (Final Exercise Prescription Changes):  Exercise Prescription Changes - 08/11/23 1300       Response to Exercise   Blood Pressure (Admit) 128/56    Blood Pressure (Exit) 110/52    Heart Rate (Admit) 61 bpm    Heart Rate (Exercise) 89 bpm    Heart Rate (Exit) 66 bpm    Rating of Perceived Exertion (Exercise) 12    Duration Continue with 30 min of aerobic exercise without signs/symptoms of physical distress.    Intensity THRR unchanged      Progression   Progression Continue to progress workloads to maintain intensity without signs/symptoms of physical distress.      Resistance Training   Training Prescription Yes    Weight 1    Reps 10-15      NuStep   Level 2    SPM 93    Minutes 15    METs 2      Track   Laps 23    Minutes 15      Home Exercise Plan   Plans to continue exercise at Home (comment)    Frequency Add 2 additional days to program exercise sessions.      Oxygen   Maintain Oxygen Saturation 88% or higher  Nutrition:  Target Goals: Understanding of nutrition guidelines, daily intake of sodium 1500mg , cholesterol 200mg , calories 30% from fat and 7% or less from saturated fats, daily to have 5 or more servings of fruits and vegetables.  Biometrics:  Pre Biometrics - 07/07/23 1014       Pre Biometrics   Height 5' (1.524 m)    Weight 155 lb 1.6 oz (70.4 kg)    Waist Circumference 33 inches    Hip Circumference 40 inches    Waist to Hip Ratio 0.83 %    BMI (Calculated) 30.29    Grip Strength 21.3 kg    Single Leg Stand 3.9 seconds              Nutrition Therapy Plan and Nutrition Goals:   Nutrition Assessments:  Nutrition Assessments - 07/07/23 0858       MEDFICTS Scores   Pre Score 3            MEDIFICTS Score Key: >=70 Need to make dietary  changes  40-70 Heart Healthy Diet <= 40 Therapeutic Level Cholesterol Diet   Picture Your Plate Scores: <56 Unhealthy dietary pattern with much room for improvement. 41-50 Dietary pattern unlikely to meet recommendations for good health and room for improvement. 51-60 More healthful dietary pattern, with some room for improvement.  >60 Healthy dietary pattern, although there may be some specific behaviors that could be improved.    Nutrition Goals Re-Evaluation:  Nutrition Goals Re-Evaluation     Row Name 07/28/23 1115             Goals   Nutrition Goal Build up protein       Comment [name] is doing well in rehab.  She is eating lots of fruits and vegetables.  Her doctor wants to her increase her protein and try to eat more pinto beans.  She does not eat too much meat. She really enjoys her fruit as a dessert.  There are times where her appetitie is lacking, but she makes herself eat to maintain.       Expected Outcome Short: Try pintos as source of protein Long; Continue to eat heart healthy                Nutrition Goals Discharge (Final Nutrition Goals Re-Evaluation):  Nutrition Goals Re-Evaluation - 07/28/23 1115       Goals   Nutrition Goal Build up protein    Comment [name] is doing well in rehab.  She is eating lots of fruits and vegetables.  Her doctor wants to her increase her protein and try to eat more pinto beans.  She does not eat too much meat. She really enjoys her fruit as a dessert.  There are times where her appetitie is lacking, but she makes herself eat to maintain.    Expected Outcome Short: Try pintos as source of protein Long; Continue to eat heart healthy             Psychosocial: Target Goals: Acknowledge presence or absence of significant depression and/or stress, maximize coping skills, provide positive support system. Participant is able to verbalize types and ability to use techniques and skills needed for reducing stress and  depression.  Initial Review & Psychosocial Screening:  Initial Psych Review & Screening - 07/07/23 0900       Initial Review   Current issues with None Identified      Family Dynamics   Good Support System? Yes    Comments Patient's friend and  chuch family are her support.      Barriers   Psychosocial barriers to participate in program There are no identifiable barriers or psychosocial needs.      Screening Interventions   Interventions Encouraged to exercise;To provide support and resources with identified psychosocial needs;Provide feedback about the scores to participant    Expected Outcomes Short Term goal: Utilizing psychosocial counselor, staff and physician to assist with identification of specific Stressors or current issues interfering with healing process. Setting desired goal for each stressor or current issue identified.;Long Term Goal: Stressors or current issues are controlled or eliminated.;Short Term goal: Identification and review with participant of any Quality of Life or Depression concerns found by scoring the questionnaire.;Long Term goal: The participant improves quality of Life and PHQ9 Scores as seen by post scores and/or verbalization of changes             Quality of Life Scores:  Quality of Life - 07/07/23 1015       Quality of Life   Select Quality of Life      Quality of Life Scores   Health/Function Pre 29.14 %    Socioeconomic Pre 26 %    Psych/Spiritual Pre 30 %    Family Pre 25.5 %    GLOBAL Pre 28.26 %            Scores of 19 and below usually indicate a poorer quality of life in these areas.  A difference of  2-3 points is a clinically meaningful difference.  A difference of 2-3 points in the total score of the Quality of Life Index has been associated with significant improvement in overall quality of life, self-image, physical symptoms, and general health in studies assessing change in quality of life.  PHQ-9: Review Flowsheet        07/07/2023  Depression screen PHQ 2/9  Decreased Interest 0  Down, Depressed, Hopeless 0  PHQ - 2 Score 0  Altered sleeping 0  Tired, decreased energy 0  Change in appetite 0  Feeling bad or failure about yourself  0  Trouble concentrating 0  Moving slowly or fidgety/restless 0  Suicidal thoughts 0  PHQ-9 Score 0    Details           Interpretation of Total Score  Total Score Depression Severity:  1-4 = Minimal depression, 5-9 = Mild depression, 10-14 = Moderate depression, 15-19 = Moderately severe depression, 20-27 = Severe depression   Psychosocial Evaluation and Intervention:  Psychosocial Evaluation - 07/07/23 0903       Psychosocial Evaluation & Interventions   Interventions Relaxation education;Stress management education;Encouraged to exercise with the program and follow exercise prescription    Comments Patient was referred to cardiac rehab with DES x2 06/18/23. Her PHQ-9 score was 0. She denies any depression, anxiety or stressors in her life. 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 She has a daughter, 2 grandchildren and several great grand children. She says she does not have alot of contact with her family. Her slibings are deceased. She does have a lot of support from a close friend and several friends from her chruch and her church family. She has a $25 copayment which could be a barrier for her to complete the program. She is going to do 2 days/week due to the copayment. She also has OA in her right  knee and she is not sure if any of the exercising will aggervate this. We ask that she will participate for at least a month and she said she will see how things go. She reports that her right arm is still tender from the catheterization and she started having vaginal bleeding since her stent. She addressed these issues with Randall An, PA  at her follow up OV. She was told to use warm compresses on her arm and that the Brilinta could be causing the bleeding. She has a history of uterine cancer treated with a partial hysterectomy several years ago. She is scheduled to see OBGYN later this month. Her main goal for the program is to get back to her normal health prior to her stent placement.    Expected Outcomes Patient will be able to complete at least a month of the program.    Continue Psychosocial Services  Follow up required by staff             Psychosocial Re-Evaluation:  Psychosocial Re-Evaluation     Row Name 07/28/23 1109             Psychosocial Re-Evaluation   Current issues with Current Stress Concerns       Comments [name] is doing well in rehab.  She is only planning to do a month of rehab due to insurance and and wanting to exercise on her own.  She usually is a positive and out going person. She tries not to let things get to her too much. She is does have some added stress here recently with her health.  She is working with her gynocologist about some things going on for her.  She has a follow up appt next week for that.  She has difficutly sleeping with frequent trips to bathroom due to meds flushing kidneys contantly.  She feels flushed out and weak at times. She has mentioned it to the doctor but they wanted to keep her on it.       Expected Outcomes Short: Continue to talk with doctor about meds Long: continue to cope with stress/health concerns       Interventions Encouraged to attend Cardiac Rehabilitation for the exercise       Continue Psychosocial Services  Follow up required by staff                Psychosocial Discharge (Final Psychosocial Re-Evaluation):  Psychosocial Re-Evaluation - 07/28/23 1109       Psychosocial Re-Evaluation   Current issues with Current Stress Concerns    Comments [name] is doing well in rehab.  She is only planning to do a month of rehab due to insurance and and  wanting to exercise on her own.  She usually is a positive and out going person. She tries not to let things get to her too much. She is does have some added stress here recently with her health.  She is working with her gynocologist about some things going on for her.  She has a follow up appt next week for that.  She has difficutly sleeping with frequent trips to bathroom due to meds flushing kidneys contantly.  She feels flushed out and weak at times. She has mentioned it to the doctor but they wanted to keep her on it.    Expected Outcomes Short: Continue to talk with doctor about meds Long: continue to cope with stress/health concerns    Interventions Encouraged to attend Cardiac Rehabilitation for the exercise  Continue Psychosocial Services  Follow up required by staff             Vocational Rehabilitation: Provide vocational rehab assistance to qualifying candidates.   Vocational Rehab Evaluation & Intervention:  Vocational Rehab - 07/07/23 2951       Initial Vocational Rehab Evaluation & Intervention   Assessment shows need for Vocational Rehabilitation No      Vocational Rehab Re-Evaulation   Comments Patient has already retruned to work as a Comptroller for an elderly lady.             Education: Education Goals: Education classes will be provided on a weekly basis, covering required topics. Participant will state understanding/return demonstration of topics presented.  Learning Barriers/Preferences:  Learning Barriers/Preferences - 07/07/23 0858       Learning Barriers/Preferences   Learning Barriers None    Learning Preferences Audio             Education Topics: Hypertension, Hypertension Reduction -Define heart disease and high blood pressure. Discus how high blood pressure affects the body and ways to reduce high blood pressure.   Exercise and Your Heart -Discuss why it is important to exercise, the FITT principles of exercise, normal and abnormal  responses to exercise, and how to exercise safely.   Angina -Discuss definition of angina, causes of angina, treatment of angina, and how to decrease risk of having angina.   Cardiac Medications -Review what the following cardiac medications are used for, how they affect the body, and side effects that may occur when taking the medications.  Medications include Aspirin, Beta blockers, calcium channel blockers, ACE Inhibitors, angiotensin receptor blockers, diuretics, digoxin, and antihyperlipidemics. Flowsheet Row CARDIAC REHAB PHASE II EXERCISE from 08/06/2023 in [place] CARDIAC REHABILITATION  Date 07/30/23  Educator DJ       Congestive Heart Failure -Discuss the definition of CHF, how to live with CHF, the signs and symptoms of CHF, and how keep track of weight and sodium intake.   Heart Disease and Intimacy -Discus the effect sexual activity has on the heart, how changes occur during intimacy as we age, and safety during sexual activity.   Smoking Cessation / COPD -Discuss different methods to quit smoking, the health benefits of quitting smoking, and the definition of COPD. Flowsheet Row CARDIAC REHAB PHASE II EXERCISE from 08/06/2023 in [place] CARDIAC REHABILITATION  Date 08/06/23  Educator jh  Instruction Review Code 1- Verbalizes Understanding       Nutrition I: Fats -Discuss the types of cholesterol, what cholesterol does to the heart, and how cholesterol levels can be controlled. Flowsheet Row CARDIAC REHAB PHASE II EXERCISE from 08/06/2023 in [place] CARDIAC REHABILITATION  Date 07/16/23  Educator [facility]  Instruction Review Code 1- Verbalizes Understanding       Nutrition II: Labels -Discuss the different components of food labels and how to read food label Flowsheet Row CARDIAC REHAB PHASE II EXERCISE from 08/06/2023 in [place] CARDIAC REHABILITATION  Date 07/16/23  Educator [facility]  Instruction Review Code 1- Verbalizes Understanding       Heart  Parts/Heart Disease and PAD -Discuss the anatomy of the heart, the pathway of blood circulation through the heart, and these are affected by heart disease.   Stress I: Signs and Symptoms -Discuss the causes of stress, how stress may lead to anxiety and depression, and ways to limit stress.   Stress II: Relaxation -Discuss different types of relaxation techniques to limit stress.   Warning Signs of Stroke /  TIA -Discuss definition of a stroke, what the signs and symptoms are of a stroke, and how to identify when someone is having stroke.   Knowledge Questionnaire Score:  Knowledge Questionnaire Score - 07/07/23 0858       Knowledge Questionnaire Score   Pre Score 19/24             Core Components/Risk Factors/Patient Goals at Admission:  Personal Goals and Risk Factors at Admission - 07/07/23 1015       Core Components/Risk Factors/Patient Goals on Admission    Weight Management Yes;Weight Loss    Intervention Weight Management: Develop a combined nutrition and exercise program designed to reach desired caloric intake, while maintaining appropriate intake of nutrient and fiber, sodium and fats, and appropriate energy expenditure required for the weight goal.;Weight Management: Provide education and appropriate resources to help participant work on and attain dietary goals.    Admit Weight 155 lb 1.6 oz (70.4 kg)    Goal Weight: Short Term 153 lb (69.4 kg)    Goal Weight: Long Term 150 lb (68 kg)    Expected Outcomes Short Term: Continue to assess and modify interventions until short term weight is achieved;Long Term: Adherence to nutrition and physical activity/exercise program aimed toward attainment of established weight goal;Weight Loss: Understanding of general recommendations for a balanced deficit meal plan, which promotes 1-2 lb weight loss per week and includes a negative energy balance of [phone] kcal/d;Understanding recommendations for meals to include 15-35% energy  as protein, 25-35% energy from fat, 35-60% energy from carbohydrates, less than 200mg  of dietary cholesterol, 20-35 gm of total fiber daily;Understanding of distribution of calorie intake throughout the day with the consumption of 4-5 meals/snacks    Diabetes Yes    Intervention Provide education about proper nutrition, including hydration, and aerobic/resistive exercise prescription along with prescribed medications to achieve blood glucose in normal ranges: Fasting glucose 65-99 mg/dL    Expected Outcomes Short Term: Participant verbalizes understanding of the signs/symptoms and immediate care of hyper/hypoglycemia, proper foot care and importance of medication, aerobic/resistive exercise and nutrition plan for blood glucose control.;Long Term: Attainment of HbA1C < 7%.    Hypertension Yes    Intervention Provide education on lifestyle modifcations including regular physical activity/exercise, weight management, moderate sodium restriction and increased consumption of fresh fruit, vegetables, and low fat dairy, alcohol moderation, and smoking cessation.;Monitor prescription use compliance.    Expected Outcomes Short Term: Continued assessment and intervention until BP is < 140/70mm HG in hypertensive participants. < 130/44mm HG in hypertensive participants with diabetes, heart failure or chronic kidney disease.;Long Term: Maintenance of blood pressure at goal levels.    Lipids Yes    Intervention Provide education and support for participant on nutrition & aerobic/resistive exercise along with prescribed medications to achieve LDL 70mg , HDL >40mg .    Expected Outcomes Short Term: Participant states understanding of desired cholesterol values and is compliant with medications prescribed. Participant is following exercise prescription and nutrition guidelines.;Long Term: Cholesterol controlled with medications as prescribed, with individualized exercise RX and with personalized nutrition plan. Value goals:  LDL < 70mg , HDL > 40 mg.             Core Components/Risk Factors/Patient Goals Review:   Goals and Risk Factor Review     Row Name 07/28/23 1112             Core Components/Risk Factors/Patient Goals Review   Personal Goals Review Weight Management/Obesity;Hypertension;Diabetes;Lipids       Review [name] is doing  well in rehab. She is only planning to do a month in rehab due to insurance and wanting to exercise on her own.  Her weight is staying steady. Her pressures are doing well and she does check them at home.  She is checking her sugars at home as well and they are doing well overall.       Expected Outcomes Short:  Continue to work on weight Long: Conitnue to montior risk factors                Core Components/Risk Factors/Patient Goals at Discharge (Final Review):   Goals and Risk Factor Review - 07/28/23 1112       Core Components/Risk Factors/Patient Goals Review   Personal Goals Review Weight Management/Obesity;Hypertension;Diabetes;Lipids    Review [name] is doing well in rehab. She is only planning to do a month in rehab due to insurance and wanting to exercise on her own.  Her weight is staying steady. Her pressures are doing well and she does check them at home.  She is checking her sugars at home as well and they are doing well overall.    Expected Outcomes Short:  Continue to work on [employer] Long: Conitnue to montior risk factors             ITP Comments:  ITP Comments     Row Name 07/14/23 1305 07/16/23 0830 08/13/23 0823       ITP Comments First full day of exercise!  Patient was oriented to gym and equipment including functions, settings, policies, and procedures.  Patient's individual exercise prescription and treatment plan were reviewed.  All starting workloads were established based on the results of the 6 minute walk test done at initial orientation visit.  The plan for exercise progression was also introduced and progression will be customized  based on patient's performance and goals. 30 day review completed. ITP sent to Dr. Dina Rich, Medical Director of Cardiac Rehab. Continue with ITP unless changes are made by physician.   New to program 30 day review completed. ITP sent to Dr. Dina Rich, Medical Director of Cardiac Rehab. Continue with ITP unless changes are made by physician.              Comments: 30 day review

## 2023-08-13 NOTE — Progress Notes (Signed)
Daily Session Note  Patient Details  Name: [name] MRN: [id number] Date of Birth: [birth year] Referring Provider:   Flowsheet Row CARDIAC REHAB PHASE II ORIENTATION from 07/07/2023 in [facility] CARDIAC REHABILITATION  Referring Provider Dietrich Pates MD       Encounter Date: 08/13/2023  Check In:  Session Check In - 08/13/23 1030       Check-In   Supervising physician immediately available to respond to emergencies See telemetry face sheet for immediately available MD    Location [facility]    Staff Present Ross Ludwig, BS, Exercise Physiologist;Valbona Slabach Daphine Deutscher, RN, BSN;Hillary Troutman BSN, RN;Jessica Rosiclare, MA, RCEP, CCRP, CCET    Virtual Visit No    Medication changes reported     No    Fall or balance concerns reported    No    Tobacco Cessation No Change    Warm-up and Cool-down Performed on first and last piece of equipment    Resistance Training Performed Yes    VAD Patient? No      Pain Assessment   Currently in Pain? No/denies             Capillary Blood Glucose: No results found for this or any previous visit (from the past 24 hour(s)).    Social History   Tobacco Use  Smoking Status Never   Passive exposure: Never  Smokeless Tobacco Never    Goals Met:  Independence with exercise equipment Exercise tolerated well No report of concerns or symptoms today Strength training completed today  Goals Unmet:  Not Applicable  Comments: Pt able to follow exercise prescription today without complaint.  Will continue to monitor for progression.    Dr. Dina Rich is Medical Director for [facility] Cardiac Rehab

## 2023-08-15 ENCOUNTER — Encounter (HOSPITAL_COMMUNITY)
Admission: RE | Admit: 2023-08-15 | Discharge: 2023-08-15 | Disposition: A | Discharge status: Home / Self Care | Payer: Medicare PPO | Source: Ambulatory Visit | Attending: Internal Medicine | Admitting: Internal Medicine

## 2023-08-15 DIAGNOSIS — Z955 Presence of coronary angioplasty implant and graft: Secondary | ICD-10-CM

## 2023-08-15 NOTE — Progress Notes (Signed)
Daily Session Note  Patient Details  Name: [name] MRN: [id number] Date of Birth: 01/24/1946 Referring Provider:   Flowsheet Row CARDIAC REHAB PHASE II ORIENTATION from 07/07/2023 in [facility] CARDIAC REHABILITATION  Referring Provider Dietrich Pates MD       Encounter Date: 08/15/2023  Check In:  Session Check In - 08/15/23 1100       Check-In   Supervising physician immediately available to respond to emergencies See telemetry face sheet for immediately available MD    Location [facility]    Staff Present Ross Ludwig, BS, Exercise Physiologist;Debra Laural Benes, RN, Pleas Koch, RN, BSN;Jessica Hawkins, MA, RCEP, CCRP, CCET    Virtual Visit No    Medication changes reported     No    Fall or balance concerns reported    No    Tobacco Cessation No Change    Warm-up and Cool-down Performed on first and last piece of equipment    Resistance Training Performed Yes    VAD Patient? No    PAD/SET Patient? No      Pain Assessment   Currently in Pain? No/denies    Pain Score 0-No pain    Multiple Pain Sites No             Capillary Blood Glucose: No results found for this or any previous visit (from the past 24 hour(s)).    Social History   Tobacco Use  Smoking Status Never   Passive exposure: Never  Smokeless Tobacco Never    Goals Met:  Independence with exercise equipment Exercise tolerated well No report of concerns or symptoms today Strength training completed today  Goals Unmet:  Not Applicable  Comments: Pt able to follow exercise prescription today without complaint.  Will continue to monitor for progression.    Dr. Dina Rich is Medical Director for [facility] Cardiac Rehab

## 2023-08-18 ENCOUNTER — Encounter (HOSPITAL_COMMUNITY): Payer: Medicare PPO

## 2023-08-18 ENCOUNTER — Encounter (HOSPITAL_COMMUNITY)
Admission: RE | Admit: 2023-08-18 | Discharge: 2023-08-18 | Disposition: A | Discharge status: Home / Self Care | Payer: Medicare PPO | Source: Ambulatory Visit | Attending: Internal Medicine | Admitting: Internal Medicine

## 2023-08-18 DIAGNOSIS — Z955 Presence of coronary angioplasty implant and graft: Secondary | ICD-10-CM | POA: Diagnosis not present

## 2023-08-18 NOTE — Progress Notes (Signed)
Daily Session Note  Patient Details  Name: [name] MRN: [id number] Date of Birth: Mar 26, 1946 Referring Provider:   Flowsheet Row CARDIAC REHAB PHASE II ORIENTATION from 07/07/2023 in [facility] CARDIAC REHABILITATION  Referring Provider Dietrich Pates MD       Encounter Date: 08/18/2023  Check In:  Session Check In - 08/18/23 1100       Check-In   Supervising physician immediately available to respond to emergencies See telemetry face sheet for immediately available MD    Location [facility]    Staff Present Ross Ludwig, BS, Exercise Physiologist;Debra Laural Benes, RN, BSN;Jaizon Deroos, RN;Jessica Boyden, MA, RCEP, CCRP, CCET    Virtual Visit No    Medication changes reported     No    Fall or balance concerns reported    No    Tobacco Cessation No Change    Warm-up and Cool-down Performed on first and last piece of equipment    Resistance Training Performed Yes    VAD Patient? No    PAD/SET Patient? No      Pain Assessment   Currently in Pain? No/denies    Pain Score 0-No pain    Multiple Pain Sites No             Capillary Blood Glucose: No results found for this or any previous visit (from the past 24 hour(s)).    Social History   Tobacco Use  Smoking Status Never   Passive exposure: Never  Smokeless Tobacco Never    Goals Met:  Independence with exercise equipment Exercise tolerated well No report of concerns or symptoms today  Goals Unmet:  Not Applicable  Comments: Pt able to follow exercise prescription today without complaint.  Will continue to monitor for progression.    Dr. Dina Rich is Medical Director for [facility] Cardiac Rehab

## 2023-08-20 ENCOUNTER — Encounter (HOSPITAL_COMMUNITY): Payer: Medicare PPO

## 2023-08-20 ENCOUNTER — Other Ambulatory Visit: Payer: Self-pay

## 2023-08-20 ENCOUNTER — Other Ambulatory Visit (HOSPITAL_COMMUNITY): Payer: Self-pay

## 2023-08-20 ENCOUNTER — Encounter (HOSPITAL_COMMUNITY)
Admission: RE | Admit: 2023-08-20 | Discharge: 2023-08-20 | Disposition: A | Discharge status: Home / Self Care | Payer: Medicare PPO | Source: Ambulatory Visit | Attending: Internal Medicine | Admitting: Internal Medicine

## 2023-08-20 DIAGNOSIS — Z955 Presence of coronary angioplasty implant and graft: Secondary | ICD-10-CM

## 2023-08-20 NOTE — Progress Notes (Signed)
Daily Session Note  Patient Details  Name: [name] MRN: [id number] Date of Birth: 08/07/1946 Referring Provider:   Flowsheet Row CARDIAC REHAB PHASE II ORIENTATION from 07/07/2023 in [facility] CARDIAC REHABILITATION  Referring Provider Dietrich Pates MD       Encounter Date: 08/20/2023  Check In:  Session Check In - 08/20/23 1041       Check-In   Supervising physician immediately available to respond to emergencies See telemetry face sheet for immediately available MD    Location [facility]    Staff Present Ross Ludwig, BS, Exercise Physiologist;Jessica Juanetta Gosling, MA, RCEP, CCRP, CCET;Hillary Troutman BSN, RN    Virtual Visit No    Medication changes reported     No    Fall or balance concerns reported    No    Tobacco Cessation No Change    Warm-up and Cool-down Performed on first and last piece of equipment    Resistance Training Performed Yes    VAD Patient? No    PAD/SET Patient? No      Pain Assessment   Currently in Pain? No/denies    Pain Score 0-No pain    Multiple Pain Sites No             Capillary Blood Glucose: No results found for this or any previous visit (from the past 24 hour(s)).    Social History   Tobacco Use  Smoking Status Never   Passive exposure: Never  Smokeless Tobacco Never    Goals Met:  Independence with exercise equipment Exercise tolerated well No report of concerns or symptoms today Strength training completed today  Goals Unmet:  Not Applicable  Comments: Pt able to follow exercise prescription today without complaint.  Will continue to monitor for progression.    Dr. Dina Rich is Medical Director for [facility] Cardiac Rehab

## 2023-08-22 ENCOUNTER — Encounter (HOSPITAL_COMMUNITY): Payer: Medicare PPO

## 2023-08-25 ENCOUNTER — Encounter (HOSPITAL_COMMUNITY): Payer: Medicare PPO

## 2023-08-25 ENCOUNTER — Encounter (HOSPITAL_COMMUNITY)
Admission: RE | Admit: 2023-08-25 | Discharge: 2023-08-25 | Disposition: A | Discharge status: Home / Self Care | Payer: Medicare PPO | Source: Ambulatory Visit | Attending: Internal Medicine | Admitting: Internal Medicine

## 2023-08-25 DIAGNOSIS — Z955 Presence of coronary angioplasty implant and graft: Secondary | ICD-10-CM | POA: Diagnosis not present

## 2023-08-25 NOTE — Progress Notes (Signed)
Daily Session Note  Patient Details  Name: [name] MRN: [id number] Date of Birth: [birth year] Referring Provider:   Flowsheet Row CARDIAC REHAB PHASE II ORIENTATION from 07/07/2023 in [facility] CARDIAC REHABILITATION  Referring Provider Dietrich Pates MD       Encounter Date: 08/25/2023  Check In:  Session Check In - 08/25/23 1254       Check-In   Supervising physician immediately available to respond to emergencies See telemetry face sheet for immediately available MD    Location [facility]    Staff Present Ross Ludwig, BS, Exercise Physiologist;Daphyne Daphine Deutscher, RN, Neal Dy, RN, BSN    Virtual Visit No    Medication changes reported     No    Fall or balance concerns reported    No    Warm-up and Cool-down Performed on first and last piece of equipment    Resistance Training Performed Yes    VAD Patient? No    PAD/SET Patient? No      Pain Assessment   Currently in Pain? No/denies             Capillary Blood Glucose: No results found for this or any previous visit (from the past 24 hour(s)).    Social History   Tobacco Use  Smoking Status Never   Passive exposure: Never  Smokeless Tobacco Never    Goals Met:  Independence with exercise equipment Exercise tolerated well Personal goals reviewed No report of concerns or symptoms today Strength training completed today  Goals Unmet:  Not Applicable  Comments: Pt able to follow exercise prescription today without complaint.  Will continue to monitor for progression.    Dr. Dina Rich is Medical Director for [facility] Cardiac Rehab

## 2023-08-27 ENCOUNTER — Encounter (HOSPITAL_COMMUNITY): Payer: Medicare PPO

## 2023-08-27 ENCOUNTER — Encounter (HOSPITAL_COMMUNITY)
Admission: RE | Admit: 2023-08-27 | Discharge: 2023-08-27 | Disposition: A | Discharge status: Home / Self Care | Payer: Medicare PPO | Source: Ambulatory Visit | Attending: Internal Medicine

## 2023-08-27 DIAGNOSIS — Z955 Presence of coronary angioplasty implant and graft: Secondary | ICD-10-CM

## 2023-08-27 NOTE — Progress Notes (Signed)
Daily Session Note  Patient Details  Name: [name] MRN: [id number] Date of Birth: [birth year] Referring Provider:   Flowsheet Row CARDIAC REHAB PHASE II ORIENTATION from 07/07/2023 in [facility] CARDIAC REHABILITATION  Referring Provider Dietrich Pates MD       Encounter Date: 08/27/2023  Check In:  Session Check In - 08/27/23 1033       Check-In   Supervising physician immediately available to respond to emergencies See telemetry face sheet for immediately available MD    Location [facility]    Staff Present Ross Ludwig, BS, Exercise Physiologist;Jessica Juanetta Gosling, MA, RCEP, CCRP, CCET;Hillary Troutman BSN, RN    Virtual Visit No    Medication changes reported     No    Fall or balance concerns reported    No    Tobacco Cessation No Change    Warm-up and Cool-down Performed on first and last piece of equipment    Resistance Training Performed Yes    VAD Patient? No    PAD/SET Patient? No      Pain Assessment   Currently in Pain? No/denies    Pain Score 0-No pain    Multiple Pain Sites No             Capillary Blood Glucose: No results found for this or any previous visit (from the past 24 hour(s)).    Social History   Tobacco Use  Smoking Status Never   Passive exposure: Never  Smokeless Tobacco Never    Goals Met:  Independence with exercise equipment Exercise tolerated well Personal goals reviewed Strength training completed today  Goals Unmet:  Not Applicable  Comments: Pt able to follow exercise prescription today without complaint.  Will continue to monitor for progression.    Dr. Dina Rich is Medical Director for [facility] Cardiac Rehab

## 2023-08-29 ENCOUNTER — Encounter (HOSPITAL_COMMUNITY): Payer: Medicare PPO

## 2023-09-01 ENCOUNTER — Encounter (HOSPITAL_COMMUNITY)
Admission: RE | Admit: 2023-09-01 | Discharge: 2023-09-01 | Disposition: A | Discharge status: Home / Self Care | Payer: Medicare PPO | Source: Ambulatory Visit | Attending: Internal Medicine | Admitting: Internal Medicine

## 2023-09-01 ENCOUNTER — Encounter (HOSPITAL_COMMUNITY): Payer: Medicare PPO

## 2023-09-01 VITALS — Ht 60.0 in | Wt 153.6 lb

## 2023-09-01 DIAGNOSIS — Z955 Presence of coronary angioplasty implant and graft: Secondary | ICD-10-CM | POA: Diagnosis not present

## 2023-09-01 NOTE — Progress Notes (Signed)
Daily Session Note  Patient Details  Name: [name] MRN: [id number] Date of Birth: 02/06/1946 Referring Provider:   Flowsheet Row CARDIAC REHAB PHASE II ORIENTATION from 07/07/2023 in [facility] CARDIAC REHABILITATION  Referring Provider Dietrich Pates MD       Encounter Date: 09/01/2023  Check In:  Session Check In - 09/01/23 1045       Check-In   Supervising physician immediately available to respond to emergencies See telemetry face sheet for immediately available ER MD    Location [facility]    Staff Present Rodena Medin, RN, BSN;Heather Fredric Mare, BS, Exercise Physiologist;Jessica Juanetta Gosling, MA, RCEP, CCRP, CCET;Hillary [employer], RN    Virtual Visit No    Medication changes reported     No    Fall or balance concerns reported    No    Warm-up and Cool-down Performed on first and last piece of equipment    Resistance Training Performed Yes    VAD Patient? No    PAD/SET Patient? No      Pain Assessment   Currently in Pain? No/denies    Pain Score 0-No pain    Multiple Pain Sites No             Capillary Blood Glucose: No results found for this or any previous visit (from the past 24 hour(s)).    Social History   Tobacco Use  Smoking Status Never   Passive exposure: Never  Smokeless Tobacco Never    Goals Met:  Independence with exercise equipment Exercise tolerated well No report of concerns or symptoms today Strength training completed today  Goals Unmet:  Not Applicable  Comments: Pt able to follow exercise prescription today without complaint.  Will continue to monitor for progression.    Dr. Dina Rich is Medical Director for [facility] Cardiac Rehab

## 2023-09-01 NOTE — Progress Notes (Signed)
Discharge Summary: [name] 1946/05/06   [facility] graduated today from  rehab with 22 sessions completed.  Details of the patient's exercise prescription and what she needs to do in order to continue the prescription and progress were discussed with patient.  Patient was given a copy of prescription and goals.  Patient verbalized understanding. [name] plans to continue to exercise by walking at home.    6 Minute Walk     Row Name 07/07/23 1010 09/01/23 1144       6 Minute Walk   Phase Initial Discharge    Distance 964 feet 1055 feet    Distance % Change -- 14.2 %    Distance Feet Change -- 131 ft    Walk Time 6 minutes 6 minutes    # of Rest Breaks 0 0    MPH 1.83 2    METS 1.57 1.79    RPE 12 12    VO2 Peak 5.49 6.26    Symptoms Yes (comment) Yes (comment)    Comments knee pain 5/10 knee pain 7/10    Resting HR 54 bpm 56 bpm    Resting BP 130/56 126/50    Resting Oxygen Saturation  98 % --    Exercise Oxygen Saturation  during 6 min walk 97 % --    Max Ex. HR 75 bpm 92 bpm    Max Ex. BP 148/74 128/74    2 Minute Post BP 122/62 --

## 2023-09-01 NOTE — Patient Instructions (Signed)
Discharge Patient Instructions  Patient Details  Name: [name] MRN: [id number] Date of Birth: 1946-06-08 Referring Provider:  Elfredia Nevins, MD   Number of Visits: 22  Reason for Discharge:  Patient reached a stable level of exercise. Patient independent in their exercise.  Smoking History:  Social History   Tobacco Use  Smoking Status Never   Passive exposure: Never  Smokeless Tobacco Never    Diagnosis:  Status post coronary artery stent placement  Initial Exercise Prescription:  Initial Exercise Prescription - 07/07/23 1000       Date of Initial Exercise RX and Referring Provider   Date 07/07/23    Referring Provider Dietrich Pates MD      Oxygen   Maintain Oxygen Saturation 88% or higher      Treadmill   MPH 1.6    Grade 0.5    Minutes 15    METs 2.33      NuStep   Level 1    SPM 80    Minutes 15    METs 2      Prescription Details   Frequency (times per week) 2    Duration Progress to 30 minutes of continuous aerobic without signs/symptoms of physical distress      Intensity   THRR 40-80% of Max Heartrate 90-126    Ratings of Perceived Exertion 11-13    Perceived Dyspnea 0-4      Progression   Progression Continue to progress workloads to maintain intensity without signs/symptoms of physical distress.      Resistance Training   Training Prescription Yes    Weight 3 lb    Reps 10-15             Discharge Exercise Prescription (Final Exercise Prescription Changes):  Exercise Prescription Changes - 08/25/23 0912       Response to Exercise   Blood Pressure (Admit) 122/62    Blood Pressure (Exit) 102/50    Heart Rate (Admit) 60 bpm    Heart Rate (Exercise) 95 bpm    Heart Rate (Exit) 63 bpm    Rating of Perceived Exertion (Exercise) 12    Duration Continue with 30 min of aerobic exercise without signs/symptoms of physical distress.    Intensity THRR unchanged      Progression   Progression Continue to progress workloads to  maintain intensity without signs/symptoms of physical distress.      Resistance Training   Training Prescription Yes    Weight 1 lbs    Reps 10-15      NuStep   Level 3    SPM 83    Minutes 15    METs 2.1      Track   Laps 22    Minutes 15    METs 2      Home Exercise Plan   Plans to continue exercise at Home (comment)    Frequency Add 2 additional days to program exercise sessions.      Oxygen   Maintain Oxygen Saturation 88% or higher             Functional Capacity:  6 Minute Walk     Row Name 07/07/23 1010 09/01/23 1144       6 Minute Walk   Phase Initial Discharge    Distance 964 feet 1055 feet    Distance % Change -- 14.2 %    Distance Feet Change -- 131 ft    Walk Time 6 minutes 6 minutes    #  of Rest Breaks 0 0    MPH 1.83 2    METS 1.57 1.79    RPE 12 12    VO2 Peak 5.49 6.26    Symptoms Yes (comment) Yes (comment)    Comments knee pain 5/10 knee pain 7/10    Resting HR 54 bpm 56 bpm    Resting BP 130/56 126/50    Resting Oxygen Saturation  98 % --    Exercise Oxygen Saturation  during 6 min walk 97 % --    Max Ex. HR 75 bpm 92 bpm    Max Ex. BP 148/74 128/74    2 Minute Post BP 122/62 --             Nutrition & Weight - Outcomes:  Pre Biometrics - 07/07/23 1014       Pre Biometrics   Height 5' (1.524 m)    Weight 70.4 kg    Waist Circumference 33 inches    Hip Circumference 40 inches    Waist to Hip Ratio 0.83 %    BMI (Calculated) 30.29    Grip Strength 21.3 kg    Single Leg Stand 3.9 seconds             Post Biometrics - 09/01/23 1145        Post  Biometrics   Height 5' (1.524 m)    Weight 69.7 kg    Waist Circumference 32 inches    Hip Circumference 39 inches    Waist to Hip Ratio 0.82 %    BMI (Calculated) 30    Grip Strength 28.9 kg    Single Leg Stand 14.4 seconds

## 2023-09-01 NOTE — Progress Notes (Signed)
Cardiac Individual Treatment Plan  Patient Details  Name: [name] MRN: [id number] Date of Birth: Jan 05, 1946 Referring Provider:   Flowsheet Row CARDIAC REHAB PHASE II ORIENTATION from 07/07/2023 in [facility] CARDIAC REHABILITATION  Referring Provider Dietrich Pates MD       Initial Encounter Date:  Flowsheet Row CARDIAC REHAB PHASE II ORIENTATION from 07/07/2023 in [place] CARDIAC REHABILITATION  Date 07/07/23       Visit Diagnosis: Status post coronary artery stent placement  Patient's Home Medications on Admission:  Current Outpatient Medications:    amLODipine (NORVASC) 10 MG tablet, Take 1 tablet (10 mg total) by mouth daily. (Patient taking differently: Take 10 mg by mouth at bedtime.), Disp: 30 tablet, Rfl: 2   aspirin EC 81 MG tablet, Take 81 mg by mouth every morning., Disp: , Rfl:    calcium elemental as carbonate (BARIATRIC TUMS ULTRA) 400 MG chewable tablet, Chew 1-2 tablets by mouth 2 (two) times daily as needed for indigestion or heartburn., Disp: , Rfl:    Cholecalciferol (VITAMIN D) 125 MCG (5000 UT) CAPS, Take 1 tablet by mouth in the morning., Disp: , Rfl:    empagliflozin (JARDIANCE) 25 MG TABS tablet, Take 25 mg by mouth in the morning., Disp: , Rfl:    JANUMET XR [phone] MG TB24, Take 1 tablet by mouth in the morning., Disp: , Rfl:    lisinopril (PRINIVIL,ZESTRIL) 20 MG tablet, Take 20 mg by mouth in the morning., Disp: , Rfl:    metroNIDAZOLE (METROGEL) 0.75 % vaginal gel, Place 1 Applicatorful vaginally at bedtime., Disp: 70 g, Rfl: 0   nitroGLYCERIN (NITROSTAT) 0.4 MG SL tablet, Place 1 tablet (0.4 mg total) under the tongue every 5 (five) minutes as needed for chest pain., Disp: 25 tablet, Rfl: 3   Polyethyl Glycol-Propyl Glycol (SYSTANE OP), Place 1 drop into both eyes 2 (two) times daily., Disp: , Rfl:    rosuvastatin (CRESTOR) 20 MG tablet, Take 1 tablet (20 mg total) by mouth daily., Disp: 90 tablet, Rfl: 3   ticagrelor (BRILINTA) 90 MG TABS tablet,  Take 1 tablet (90 mg total) by mouth 2 (two) times daily., Disp: 180 tablet, Rfl: 2  Past Medical History: Past Medical History:  Diagnosis Date   Arthritis    CAD (coronary artery disease)    a. s/p BMS to LCx in 2008 b. DESx1 to mid-LAD and DESx2 to D1 in 06/2023   Cancer (HCC)    Diabetes mellitus (HCC)    type 2 x  1 or 2 yrs   Fatty liver    GERD (gastroesophageal reflux disease)    High cholesterol    Hypertension    Seasonal allergies     Tobacco Use: Social History   Tobacco Use  Smoking Status Never   Passive exposure: Never  Smokeless Tobacco Never    Labs: Review Flowsheet  More data may exist      Latest Ref Rng & Units 09/12/2007 05/11/2013 10/22/2016 11/30/2021 06/18/2023  Labs for ITP Cardiac and Pulmonary Rehab  Cholestrol 0 - 200 mg/dL 478        ATP III CLASSIFICATION:  <200     mg/dL   Desirable  295-621  mg/dL   Borderline High  >=308    mg/dL   High  657  846  962  -  LDL (calc) 0 - 99 mg/dL 952        Total Cholesterol/HDL:CHD Risk Coronary Heart Disease Risk Table  Men   Women  1/2 Average Risk   3.4   3.3  66  147  59  -  HDL-C >40 mg/dL 32  43  45  38  -  Trlycerides <150 mg/dL 188  416  606  56  -  Hemoglobin A1c 4.8 - 5.6 % 6.3 (NOTE)  Therapeutic target for the treatment of diabetes mellitus patients is <7% HbA1c.  American Diabetes Association Diabetes Care 2002;25:S33-S49.  - - 7.2  -  PH, Arterial 7.35 - 7.45 - - - - 7.390   PCO2 arterial 32 - 48 mmHg - - - - 37.1   Bicarbonate 20.0 - 28.0 mmol/L 20.0 - 28.0 mmol/L - - - - 24.6  24.4  22.4   TCO2 22 - 32 mmol/L 22 - 32 mmol/L - - - - 26  26  24    Acid-base deficit 0.0 - 2.0 mmol/L 0.0 - 2.0 mmol/L - - - - 1.0  1.0  2.0   O2 Saturation % % - - - - 70  71  97     Details       Multiple values from one day are sorted in reverse-chronological order         Capillary Blood Glucose: Lab Results  Component Value Date   GLUCAP 93 07/18/2023   GLUCAP 138 (H)  07/18/2023   GLUCAP 104 (H) 07/16/2023   GLUCAP 131 (H) 07/16/2023   GLUCAP 164 (H) 07/14/2023     Exercise Target Goals: Exercise Program Goal: Individual exercise prescription set using results from initial 6 min walk test and THRR while considering  patient's activity barriers and safety.   Exercise Prescription Goal: Starting with aerobic activity 30 plus minutes a day, 3 days per week for initial exercise prescription. Provide home exercise prescription and guidelines that participant acknowledges understanding prior to discharge.  Activity Barriers & Risk Stratification:  Activity Barriers & Cardiac Risk Stratification - 07/07/23 1011       Activity Barriers & Cardiac Risk Stratification   Activity Barriers Arthritis;Balance Concerns;Deconditioning;Muscular Weakness;Joint Problems   chronic knee pain from arthritisi   Cardiac Risk Stratification Moderate             6 Minute Walk:  6 Minute Walk     Row Name 07/07/23 1010 09/01/23 1144       6 Minute Walk   Phase Initial Discharge    Distance 964 feet 1055 feet    Distance % Change -- 14.2 %    Distance Feet Change -- 131 ft    Walk Time 6 minutes 6 minutes    # of Rest Breaks 0 0    MPH 1.83 2    METS 1.57 1.79    RPE 12 12    VO2 Peak 5.49 6.26    Symptoms Yes (comment) Yes (comment)    Comments knee pain 5/10 knee pain 7/10    Resting HR 54 bpm 56 bpm    Resting BP 130/56 126/50    Resting Oxygen Saturation  98 % --    Exercise Oxygen Saturation  during 6 min walk 97 % --    Max Ex. HR 75 bpm 92 bpm    Max Ex. BP 148/74 128/74    2 Minute Post BP 122/62 --             Oxygen Initial Assessment:   Oxygen Re-Evaluation:   Oxygen Discharge (Final Oxygen Re-Evaluation):   Initial Exercise Prescription:  Initial Exercise Prescription -  07/07/23 1000       Date of Initial Exercise RX and Referring Provider   Date 07/07/23    Referring Provider Dietrich Pates MD      Oxygen   Maintain  Oxygen Saturation 88% or higher      Treadmill   MPH 1.6    Grade 0.5    Minutes 15    METs 2.33      NuStep   Level 1    SPM 80    Minutes 15    METs 2      Prescription Details   Frequency (times per week) 2    Duration Progress to 30 minutes of continuous aerobic without signs/symptoms of physical distress      Intensity   THRR 40-80% of Max Heartrate 90-126    Ratings of Perceived Exertion 11-13    Perceived Dyspnea 0-4      Progression   Progression Continue to progress workloads to maintain intensity without signs/symptoms of physical distress.      Resistance Training   Training Prescription Yes    Weight 3 lb    Reps 10-15             Perform Capillary Blood Glucose checks as needed.  Exercise Prescription Changes:   Exercise Prescription Changes     Row Name 07/07/23 1000 07/16/23 1200 07/28/23 1100 08/11/23 1300 08/25/23 0912     Response to Exercise   Blood Pressure (Admit) 130/56 120/50 -- 128/56 122/62   Blood Pressure (Exercise) 148/74 -- -- -- --   Blood Pressure (Exit) 122/62 116/60 -- 110/52 102/50   Heart Rate (Admit) 54 bpm 57 bpm -- 61 bpm 60 bpm   Heart Rate (Exercise) 75 bpm 85 bpm -- 89 bpm 95 bpm   Heart Rate (Exit) 57 bpm 65 bpm -- 66 bpm 63 bpm   Oxygen Saturation (Admit) 98 % -- -- -- --   Oxygen Saturation (Exercise) 97 % -- -- -- --   Rating of Perceived Exertion (Exercise) 12 12 -- 12 12   Symptoms knee pain 5/10 -- -- -- --   Comments walk test results -- -- -- --   Duration -- Continue with 30 min of aerobic exercise without signs/symptoms of physical distress. -- Continue with 30 min of aerobic exercise without signs/symptoms of physical distress. Continue with 30 min of aerobic exercise without signs/symptoms of physical distress.   Intensity -- THRR unchanged -- THRR unchanged THRR unchanged     Progression   Progression -- Continue to progress workloads to maintain intensity without signs/symptoms of physical distress.  -- Continue to progress workloads to maintain intensity without signs/symptoms of physical distress. Continue to progress workloads to maintain intensity without signs/symptoms of physical distress.     Resistance Training   Training Prescription -- Yes -- Yes Yes   Weight -- 2 -- 1 1 lbs   Reps -- 10-15 -- 10-15 10-15     NuStep   Level -- 1 -- 2 3   SPM -- 50 -- 93 83   Minutes -- 15 -- 15 15   METs -- 2 -- 2 2.1     Track   Laps -- 21 -- 23 22   Minutes -- 15 -- 15 15   METs -- -- -- -- 2     Home Exercise Plan   Plans to continue exercise at -- -- Home (comment)  walking, stepper/pedal machine Home (comment) Home (comment)   Frequency -- --  Add 2 additional days to program exercise sessions. Add 2 additional days to program exercise sessions. Add 2 additional days to program exercise sessions.   Initial Home Exercises Provided -- -- 07/28/23 -- --     Oxygen   Maintain Oxygen Saturation -- -- 88% or higher 88% or higher 88% or higher            Exercise Comments:   Exercise Comments     Row Name 07/14/23 1305 09/01/23 1147         Exercise Comments First full day of exercise!  Patient was oriented to gym and equipment including functions, settings, policies, and procedures.  Patient's individual exercise prescription and treatment plan were reviewed.  All starting workloads were established based on the results of the 6 minute walk test done at initial orientation visit.  The plan for exercise progression was also introduced and progression will be customized based on patient's performance and goals. Kamila graduated today from  rehab with 22 sessions completed.  Details of the patient's exercise prescription and what she needs to do in order to continue the prescription and progress were discussed with patient.  Patient was given a copy of prescription and goals.  Patient verbalized understanding. Tinnie plans to continue to exercise by walking at home.                Exercise Goals and Review:   Exercise Goals     Row Name 07/07/23 1013             Exercise Goals   Increase Physical Activity Yes       Intervention Provide advice, education, support and counseling about physical activity/exercise needs.;Develop an individualized exercise prescription for aerobic and resistive training based on initial evaluation findings, risk stratification, comorbidities and participant's personal goals.       Expected Outcomes Short Term: Attend rehab on a regular basis to increase amount of physical activity.;Long Term: Add in home exercise to make exercise part of routine and to increase amount of physical activity.;Long Term: Exercising regularly at least 3-5 days a week.       Increase Strength and Stamina Yes       Intervention Provide advice, education, support and counseling about physical activity/exercise needs.;Develop an individualized exercise prescription for aerobic and resistive training based on initial evaluation findings, risk stratification, comorbidities and participant's personal goals.       Expected Outcomes Short Term: Increase workloads from initial exercise prescription for resistance, speed, and METs.;Short Term: Perform resistance training exercises routinely during rehab and add in resistance training at home;Long Term: Improve cardiorespiratory fitness, muscular endurance and strength as measured by increased METs and functional capacity ( )       Able to understand and use rate of perceived exertion (RPE) scale Yes       Intervention Provide education and explanation on how to use RPE scale       Expected Outcomes Short Term: Able to use RPE daily in rehab to express subjective intensity level;Long Term:  Able to use RPE to guide intensity level when exercising independently       Able to understand and use Dyspnea scale Yes       Intervention Provide education and explanation on how to use Dyspnea scale       Expected Outcomes Long  Term: Able to use Dyspnea scale to guide intensity level when exercising independently;Short Term: Able to use Dyspnea scale daily in rehab to express subjective sense of  shortness of breath during exertion       Knowledge and understanding of Target Heart Rate Range (THRR) Yes       Intervention Provide education and explanation of THRR including how the numbers were predicted and where they are located for reference       Expected Outcomes Short Term: Able to state/look up THRR;Short Term: Able to use daily as guideline for intensity in rehab;Long Term: Able to use THRR to govern intensity when exercising independently       Able to check pulse independently Yes       Intervention Provide education and demonstration on how to check pulse in carotid and radial arteries.;Review the importance of being able to check your own pulse for safety during independent exercise       Expected Outcomes Short Term: Able to explain why pulse checking is important during independent exercise;Long Term: Able to check pulse independently and accurately       Understanding of Exercise Prescription Yes       Intervention Provide education, explanation, and written materials on patient's individual exercise prescription       Expected Outcomes Short Term: Able to explain program exercise prescription;Long Term: Able to explain home exercise prescription to exercise independently                Exercise Goals Re-Evaluation :  Exercise Goals Re-Evaluation     Row Name 07/07/23 1014 07/17/23 0939 07/28/23 1107 08/25/23 1115 08/26/23 0914     Exercise Goal Re-Evaluation   Exercise Goals Review Able to understand and use rate of perceived exertion (RPE) scale;Able to understand and use Dyspnea scale Increase Physical Activity;Understanding of Exercise Prescription;Increase Strength and Stamina Increase Physical Activity;Increase Strength and Stamina;Understanding of Exercise Prescription Increase Physical  Activity;Increase Strength and Stamina;Understanding of Exercise Prescription Increase Physical Activity;Increase Strength and Stamina;Understanding of Exercise Prescription   Comments Reviewed RPE  and dyspnea scale, THR and program prescription with pt today.  Pt voiced understanding and was given a copy of goals to take home. Khianna has not increased her workloads. She walked the track instead of the treadmill this class due to not feeling great and enjoyed it better. Will continue to monitor and prgress asable Lexius is doing well in rehab.  She is only planning to do a month in rehab as she feels confident exercising at home and insurance copays. Reviewed home exercise with pt today.  Pt plans to walk and use pedal/stepper machine at home for exercise.  Reviewed THR, pulse, RPE, sign and symptoms, pulse oximetery and when to call 911 or MD.  Also discussed weather considerations and indoor options.  Pt voiced understanding. Uldine is doing well in rehab.  She is staying busy working and when she is home she likes to go for walk or use her pedal machine. She is still doing well with her stamina. Ayli is tolerating exerise well. She continues to walk the track instead of walking on the treadmill. She increases her laps each week. She is walking 23 laps on the track.She just increased her level on the NuStep to level 3. Will continue to monitor and progress as able   Expected Outcomes Short: Use RPE daily to regulate intensity.  Long: Follow program prescription Short term: increase SPM on the stepper to achieve greater then 80 SPM   long term : continue to attend caridac rehab sessions Short: Continue to add in exercise at home Long: Continue to improve stamina Short: Continue to add  in exercise Long: Continue to improve stamina Short term: continue to exercise at workloads until RPE 11   long term: continue to attend rehab             Discharge Exercise Prescription (Final Exercise Prescription Changes):   Exercise Prescription Changes - 08/25/23 0912       Response to Exercise   Blood Pressure (Admit) 122/62    Blood Pressure (Exit) 102/50    Heart Rate (Admit) 60 bpm    Heart Rate (Exercise) 95 bpm    Heart Rate (Exit) 63 bpm    Rating of Perceived Exertion (Exercise) 12    Duration Continue with 30 min of aerobic exercise without signs/symptoms of physical distress.    Intensity THRR unchanged      Progression   Progression Continue to progress workloads to maintain intensity without signs/symptoms of physical distress.      Resistance Training   Training Prescription Yes    Weight 1 lbs    Reps 10-15      NuStep   Level 3    SPM 83    Minutes 15    METs 2.1      Track   Laps 22    Minutes 15    METs 2      Home Exercise Plan   Plans to continue exercise at Home (comment)    Frequency Add 2 additional days to program exercise sessions.      Oxygen   Maintain Oxygen Saturation 88% or higher             Nutrition:  Target Goals: Understanding of nutrition guidelines, daily intake of sodium 1500mg , cholesterol 200mg , calories 30% from fat and 7% or less from saturated fats, daily to have 5 or more servings of fruits and vegetables.  Biometrics:  Pre Biometrics - 07/07/23 1014       Pre Biometrics   Height 5' (1.524 m)    Weight 70.4 kg    Waist Circumference 33 inches    Hip Circumference 40 inches    Waist to Hip Ratio 0.83 %    BMI (Calculated) 30.29    Grip Strength 21.3 kg    Single Leg Stand 3.9 seconds             Post Biometrics - 09/01/23 1145        Post  Biometrics   Height 5' (1.524 m)    Weight 69.7 kg    Waist Circumference 32 inches    Hip Circumference 39 inches    Waist to Hip Ratio 0.82 %    BMI (Calculated) 30    Grip Strength 28.9 kg    Single Leg Stand 14.4 seconds             Nutrition Therapy Plan and Nutrition Goals:   Nutrition Assessments:  Nutrition Assessments - 07/07/23 0858       MEDFICTS Scores    Pre Score 3            MEDIFICTS Score Key: >=70 Need to make dietary changes  40-70 Heart Healthy Diet <= 40 Therapeutic Level Cholesterol Diet   Picture Your Plate Scores: <65 Unhealthy dietary pattern with much room for improvement. 41-50 Dietary pattern unlikely to meet recommendations for good health and room for improvement. 51-60 More healthful dietary pattern, with some room for improvement.  >60 Healthy dietary pattern, although there may be some specific behaviors that could be improved.    Nutrition Goals Re-Evaluation:  Nutrition Goals Re-Evaluation     Row Name 07/28/23 1115 08/25/23 1117           Goals   Nutrition Goal Build up protein Short: Try pintos as source of protein Long; Continue to eat heart healthy      Comment Marcena is doing well in rehab.  She is eating lots of fruits and vegetables.  Her doctor wants to her increase her protein and try to eat more pinto beans.  She does not eat too much meat. She really enjoys her fruit as a dessert.  There are times where her appetitie is lacking, but she makes herself eat to maintain. Joniqua is doing well in rehab.  She tries to eat and makes herself eat protein. She is eating two meals a days.  She still just isn't hungry. She gets lots of vegetables. She did add inmore beans for protein.      Expected Outcome Short: Try pintos as source of protein Long; Continue to eat heart healthy Short: Conitnue to eat more protein Long Conitnue to follow heart healhty eating guidelines               Nutrition Goals Discharge (Final Nutrition Goals Re-Evaluation):  Nutrition Goals Re-Evaluation - 08/25/23 1117       Goals   Nutrition Goal Short: Try pintos as source of protein Long; Continue to eat heart healthy    Comment Jil is doing well in rehab.  She tries to eat and makes herself eat protein. She is eating two meals a days.  She still just isn't hungry. She gets lots of vegetables. She did add inmore beans for  protein.    Expected Outcome Short: Conitnue to eat more protein Long Conitnue to follow heart healhty eating guidelines             Psychosocial: Target Goals: Acknowledge presence or absence of significant depression and/or stress, maximize coping skills, provide positive support system. Participant is able to verbalize types and ability to use techniques and skills needed for reducing stress and depression.  Initial Review & Psychosocial Screening:  Initial Psych Review & Screening - 07/07/23 0900       Initial Review   Current issues with None Identified      Family Dynamics   Good Support System? Yes    Comments Patient's friend and chuch family are her support.      Barriers   Psychosocial barriers to participate in program There are no identifiable barriers or psychosocial needs.      Screening Interventions   Interventions Encouraged to exercise;To provide support and resources with identified psychosocial needs;Provide feedback about the scores to participant    Expected Outcomes Short Term goal: Utilizing psychosocial counselor, staff and physician to assist with identification of specific Stressors or current issues interfering with healing process. Setting desired goal for each stressor or current issue identified.;Long Term Goal: Stressors or current issues are controlled or eliminated.;Short Term goal: Identification and review with participant of any Quality of Life or Depression concerns found by scoring the questionnaire.;Long Term goal: The participant improves quality of Life and PHQ9 Scores as seen by post scores and/or verbalization of changes             Quality of Life Scores:  Quality of Life - 07/07/23 1015       Quality of Life   Select Quality of Life      Quality of Life Scores   Health/Function Pre 29.14 %  Socioeconomic Pre 26 %    Psych/Spiritual Pre 30 %    Family Pre 25.5 %    GLOBAL Pre 28.26 %            Scores of 19 and  below usually indicate a poorer quality of life in these areas.  A difference of  2-3 points is a clinically meaningful difference.  A difference of 2-3 points in the total score of the Quality of Life Index has been associated with significant improvement in overall quality of life, self-image, physical symptoms, and general health in studies assessing change in quality of life.  PHQ-9: Review Flowsheet       07/07/2023  Depression screen PHQ 2/9  Decreased Interest 0  Down, Depressed, Hopeless 0  PHQ - 2 Score 0  Altered sleeping 0  Tired, decreased energy 0  Change in appetite 0  Feeling bad or failure about yourself  0  Trouble concentrating 0  Moving slowly or fidgety/restless 0  Suicidal thoughts 0  PHQ-9 Score 0    Details           Interpretation of Total Score  Total Score Depression Severity:  1-4 = Minimal depression, 5-9 = Mild depression, 10-14 = Moderate depression, 15-19 = Moderately severe depression, 20-27 = Severe depression   Psychosocial Evaluation and Intervention:  Psychosocial Evaluation - 07/07/23 0903       Psychosocial Evaluation & Interventions   Interventions Relaxation education;Stress management education;Encouraged to exercise with the program and follow exercise prescription    Comments Patient was referred to cardiac rehab with DES x2 06/18/23. Her PHQ-9 score was 0. She denies any depression, anxiety or stressors in her life. 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 She has a daughter, 2 grandchildren and several great grand children. She says she does not have alot of contact with her family. Her slibings are deceased. She does have a lot of support from a close friend and several friends from her chruch and her church family. She has a $25 copayment which could be a barrier for her to complete the program. She is going to  do 2 days/week due to the copayment. She also has OA in her right knee and she is not sure if any of the exercising will aggervate this. We ask that she will participate for at least a month and she said she will see how things go. She reports that her right arm is still tender from the catheterization and she started having vaginal bleeding since her stent. She addressed these issues with Randall An, PA at her follow up OV. She was told to use warm compresses on her arm and that the Brilinta could be causing the bleeding. She has a history of uterine cancer treated with a partial hysterectomy several years ago. She is scheduled to see OBGYN later this month. Her main goal for the program is to get back to her normal health prior to her stent placement.    Expected Outcomes Patient will be able to complete at least a month of the program.    Continue Psychosocial Services  Follow up required by staff             Psychosocial Re-Evaluation:  Psychosocial Re-Evaluation     Row Name 07/28/23 1109 08/25/23 1116  Psychosocial Re-Evaluation   Current issues with Current Stress Concerns Current Stress Concerns;Current Sleep Concerns      Comments Delle is doing well in rehab.  She is only planning to do a month of rehab due to insurance and and wanting to exercise on her own.  She usually is a positive and out going person. She tries not to let things get to her too much. She is does have some added stress here recently with her health.  She is working with her gynocologist about some things going on for her.  She has a follow up appt next week for that.  She has difficutly sleeping with frequent trips to bathroom due to meds flushing kidneys contantly.  She feels flushed out and weak at times. She has mentioned it to the doctor but they wanted to keep her on it. Elowen is doing well in rehab.  She has planned her uncles funeral and still paying for it.  She says no one is stepping up to  help.  She is still not sleeping well with waking in morning and then not able to get back to sleep. She has not talked to doctor yet.  She goes to cardiologist next month.      Expected Outcomes Short: Continue to talk with doctor about meds Long: continue to cope with stress/health concerns Short: Conitnue to work on sleep Long: Conitnue to exercise for mental boost      Interventions Encouraged to attend Cardiac Rehabilitation for the exercise Encouraged to attend Cardiac Rehabilitation for the exercise      Continue Psychosocial Services  Follow up required by staff Follow up required by staff               Psychosocial Discharge (Final Psychosocial Re-Evaluation):  Psychosocial Re-Evaluation - 08/25/23 1116       Psychosocial Re-Evaluation   Current issues with Current Stress Concerns;Current Sleep Concerns    Comments Hiilani is doing well in rehab.  She has planned her uncles funeral and still paying for it.  She says no one is stepping up to help.  She is still not sleeping well with waking in morning and then not able to get back to sleep. She has not talked to doctor yet.  She goes to cardiologist next month.    Expected Outcomes Short: Conitnue to work on sleep Long: Conitnue to exercise for mental boost    Interventions Encouraged to attend Cardiac Rehabilitation for the exercise    Continue Psychosocial Services  Follow up required by staff             Vocational Rehabilitation: Provide vocational rehab assistance to qualifying candidates.   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             Education: Education Goals: Education classes will be provided on a weekly basis, covering required  topics. Participant will state understanding/return demonstration of topics presented.  Learning Barriers/Preferences:  Learning Barriers/Preferences - 07/07/23 0858       Learning Barriers/Preferences   Learning Barriers None    Learning Preferences Audio             Education Topics: Hypertension, Hypertension Reduction -Define heart disease  and high blood pressure. Discus how high blood pressure affects the body and ways to reduce high blood pressure.   Exercise and Your Heart -Discuss why it is important to exercise, the FITT principles of exercise, normal and abnormal responses to exercise, and how to exercise safely. Flowsheet Row CARDIAC REHAB PHASE II EXERCISE from 08/27/2023 in [place] CARDIAC REHABILITATION  Date 08/27/23  Educator [facility]  Instruction Review Code 1- Verbalizes Understanding       Angina -Discuss definition of angina, causes of angina, treatment of angina, and how to decrease risk of having angina. Flowsheet Row CARDIAC REHAB PHASE II EXERCISE from 08/27/2023 in [place] CARDIAC REHABILITATION  Date 08/20/23  Educator jh  Instruction Review Code 1- Verbalizes Understanding  [testing and procedures]       Cardiac Medications -Review what the following cardiac medications are used for, how they affect the body, and side effects that may occur when taking the medications.  Medications include Aspirin, Beta blockers, calcium channel blockers, ACE Inhibitors, angiotensin receptor blockers, diuretics, digoxin, and antihyperlipidemics. Flowsheet Row CARDIAC REHAB PHASE II EXERCISE from 08/27/2023 in [place] CARDIAC REHABILITATION  Date 07/30/23  Educator DJ       Congestive Heart Failure -Discuss the definition of CHF, how to live with CHF, the signs and symptoms of CHF, and how keep track of weight and sodium intake. Flowsheet Row CARDIAC REHAB PHASE II EXERCISE from 08/27/2023 in [place] CARDIAC REHABILITATION  Date 08/13/23  Educator [facility]   Instruction Review Code 2- Demonstrated Understanding       Heart Disease and Intimacy -Discus the effect sexual activity has on the heart, how changes occur during intimacy as we age, and safety during sexual activity.   Smoking Cessation / COPD -Discuss different methods to quit smoking, the health benefits of quitting smoking, and the definition of COPD. Flowsheet Row CARDIAC REHAB PHASE II EXERCISE from 08/27/2023 in [place] CARDIAC REHABILITATION  Date 08/06/23  Educator jh  Instruction Review Code 1- Verbalizes Understanding       Nutrition I: Fats -Discuss the types of cholesterol, what cholesterol does to the heart, and how cholesterol levels can be controlled. Flowsheet Row CARDIAC REHAB PHASE II EXERCISE from 08/27/2023 in [place] CARDIAC REHABILITATION  Date 07/16/23  Educator [facility]  Instruction Review Code 1- Verbalizes Understanding       Nutrition II: Labels -Discuss the different components of food labels and how to read food label Flowsheet Row CARDIAC REHAB PHASE II EXERCISE from 08/27/2023 in [place] CARDIAC REHABILITATION  Date 07/16/23  Educator [facility]  Instruction Review Code 1- Verbalizes Understanding       Heart Parts/Heart Disease and PAD -Discuss the anatomy of the heart, the pathway of blood circulation through the heart, and these are affected by heart disease.   Stress I: Signs and Symptoms -Discuss the causes of stress, how stress may lead to anxiety and depression, and ways to limit stress.   Stress II: Relaxation -Discuss different types of relaxation techniques to limit stress.   Warning Signs of Stroke / TIA -Discuss definition of a stroke, what the signs and symptoms are of a stroke, and how to identify when someone is having stroke.   Knowledge Questionnaire Score:  Knowledge Questionnaire Score - 07/07/23 0858       Knowledge Questionnaire Score   Pre Score 19/24             Core Components/Risk Factors/Patient  Goals at Admission:  Personal Goals and Risk  Factors at Admission - 07/07/23 1015       Core Components/Risk Factors/Patient Goals on Admission    Weight Management Yes;Weight Loss    Intervention Weight Management: Develop a combined nutrition and exercise program designed to reach desired caloric intake, while maintaining appropriate intake of nutrient and fiber, sodium and fats, and appropriate energy expenditure required for the weight goal.;Weight Management: Provide education and appropriate resources to help participant work on and attain dietary goals.    Admit Weight 155 lb 1.6 oz (70.4 kg)    Goal Weight: Short Term 153 lb (69.4 kg)    Goal Weight: Long Term 150 lb (68 kg)    Expected Outcomes Short Term: Continue to assess and modify interventions until short term weight is achieved;Long Term: Adherence to nutrition and physical activity/exercise program aimed toward attainment of established weight goal;Weight Loss: Understanding of general recommendations for a balanced deficit meal plan, which promotes 1-2 lb weight loss per week and includes a negative energy balance of [phone] kcal/d;Understanding recommendations for meals to include 15-35% energy as protein, 25-35% energy from fat, 35-60% energy from carbohydrates, less than 200mg  of dietary cholesterol, 20-35 gm of total fiber daily;Understanding of distribution of calorie intake throughout the day with the consumption of 4-5 meals/snacks    Diabetes Yes    Intervention Provide education about proper nutrition, including hydration, and aerobic/resistive exercise prescription along with prescribed medications to achieve blood glucose in normal ranges: Fasting glucose 65-99 mg/dL    Expected Outcomes Short Term: Participant verbalizes understanding of the signs/symptoms and immediate care of hyper/hypoglycemia, proper foot care and importance of medication, aerobic/resistive exercise and nutrition plan for blood glucose control.;Long  Term: Attainment of HbA1C < 7%.    Hypertension Yes    Intervention Provide education on lifestyle modifcations including regular physical activity/exercise, weight management, moderate sodium restriction and increased consumption of fresh fruit, vegetables, and low fat dairy, alcohol moderation, and smoking cessation.;Monitor prescription use compliance.    Expected Outcomes Short Term: Continued assessment and intervention until BP is < 140/10mm HG in hypertensive participants. < 130/21mm HG in hypertensive participants with diabetes, heart failure or chronic kidney disease.;Long Term: Maintenance of blood pressure at goal levels.    Lipids Yes    Intervention Provide education and support for participant on nutrition & aerobic/resistive exercise along with prescribed medications to achieve LDL 70mg , HDL >40mg .    Expected Outcomes Short Term: Participant states understanding of desired cholesterol values and is compliant with medications prescribed. Participant is following exercise prescription and nutrition guidelines.;Long Term: Cholesterol controlled with medications as prescribed, with individualized exercise RX and with personalized nutrition plan. Value goals: LDL < 70mg , HDL > 40 mg.             Core Components/Risk Factors/Patient Goals Review:   Goals and Risk Factor Review     Row Name 07/28/23 1112 08/25/23 1118           Core Components/Risk Factors/Patient Goals Review   Personal Goals Review Weight Management/Obesity;Hypertension;Diabetes;Lipids Weight Management/Obesity;Hypertension;Diabetes;Lipids      Review Shontaye is doing well in rehab. She is only planning to do a month in rehab due to insurance and wanting to exercise on her own.  Her weight is staying steady. Her pressures are doing well and she does check them at home.  She is checking her sugars at home as well and they are doing well overall. Monzerrat is doing well in rehab.  Her weight is holding steady.  Her sugars  are doing well and she feels that her meds are causing her to have lots of trips to bathroom as it flushes things out for her.  Blood pressures are doing well and she feels that it has been stable.      Expected Outcomes Short:  Continue to work on [employer] Long: Conitnue to montior risk factors Short: Talk to doctor about side effects from diabetes medicaiton Long: Continue to monitor risk factors.               Core Components/Risk Factors/Patient Goals at Discharge (Final Review):   Goals and Risk Factor Review - 08/25/23 1118       Core Components/Risk Factors/Patient Goals Review   Personal Goals Review Weight Management/Obesity;Hypertension;Diabetes;Lipids    Review Nakiya is doing well in rehab.  Her weight is holding steady.  Her sugars are doing well and she feels that her meds are causing her to have lots of trips to bathroom as it flushes things out for her.  Blood pressures are doing well and she feels that it has been stable.    Expected Outcomes Short: Talk to doctor about side effects from diabetes medicaiton Long: Continue to monitor risk factors.             ITP Comments:  ITP Comments     Row Name 07/14/23 1305 07/16/23 0830 08/13/23 0823 09/01/23 1147     ITP Comments First full day of exercise!  Patient was oriented to gym and equipment including functions, settings, policies, and procedures.  Patient's individual exercise prescription and treatment plan were reviewed.  All starting workloads were established based on the results of the 6 minute walk test done at initial orientation visit.  The plan for exercise progression was also introduced and progression will be customized based on patient's performance and goals. 30 day review completed. ITP sent to Dr. Dina Rich, Medical Director of Cardiac Rehab. Continue with ITP unless changes are made by physician.   New to program 30 day review completed. ITP sent to Dr. Dina Rich, Medical Director of Cardiac  Rehab. Continue with ITP unless changes are made by physician. Crystalyn graduated today from  rehab with 22 sessions completed.  Details of the patient's exercise prescription and what she needs to do in order to continue the prescription and progress were discussed with patient.  Patient was given a copy of prescription and goals.  Patient verbalized understanding. Maalle plans to continue to exercise by walking at home.             Comments: Discharge ITP

## 2023-09-03 ENCOUNTER — Encounter (HOSPITAL_COMMUNITY): Payer: Medicare PPO

## 2023-09-05 ENCOUNTER — Encounter (HOSPITAL_COMMUNITY): Payer: Medicare PPO

## 2023-09-08 ENCOUNTER — Encounter (HOSPITAL_COMMUNITY): Payer: Medicare PPO

## 2023-09-10 ENCOUNTER — Encounter (HOSPITAL_COMMUNITY): Payer: Medicare PPO

## 2023-09-12 ENCOUNTER — Encounter (HOSPITAL_COMMUNITY): Payer: Medicare PPO

## 2023-09-15 ENCOUNTER — Encounter (HOSPITAL_COMMUNITY): Payer: Medicare PPO

## 2023-09-16 ENCOUNTER — Other Ambulatory Visit (HOSPITAL_COMMUNITY): Payer: Self-pay

## 2023-09-17 ENCOUNTER — Other Ambulatory Visit (HOSPITAL_COMMUNITY)
Admission: RE | Admit: 2023-09-17 | Discharge: 2023-09-17 | Disposition: A | Discharge status: Home / Self Care | Payer: Medicare PPO | Source: Ambulatory Visit | Attending: Student | Admitting: Student

## 2023-09-17 ENCOUNTER — Encounter (HOSPITAL_COMMUNITY): Payer: Medicare PPO

## 2023-09-17 DIAGNOSIS — Z79899 Other long term (current) drug therapy: Secondary | ICD-10-CM | POA: Insufficient documentation

## 2023-09-17 LAB — LIPID PANEL
Cholesterol: 118 mg/dL (ref 0–200)
HDL: 51 mg/dL (ref >40)
LDL Cholesterol: 53 mg/dL (ref 0–99)
Total CHOL/HDL Ratio: 2.3 {ratio}
Triglycerides: 68 mg/dL (ref <150)
VLDL: 14 mg/dL (ref 0–40)

## 2023-09-17 LAB — HEPATIC FUNCTION PANEL
ALT: 15 U/L (ref 0–44)
AST: 25 U/L (ref 15–41)
Albumin: 3.9 g/dL (ref 3.5–5.0)
Alkaline Phosphatase: 78 U/L (ref 38–126)
Bilirubin, Direct: 0.1 mg/dL (ref 0.0–0.2)
Indirect Bilirubin: 0.7 mg/dL (ref 0.3–0.9)
Total Bilirubin: 0.8 mg/dL (ref 0.3–1.2)
Total Protein: 7.3 g/dL (ref 6.5–8.1)

## 2023-09-17 NOTE — Progress Notes (Unsigned)
Cardiology Office Note   Date:  09/18/2023   ID:  Cindy, [name] 1946/07/17, MRN [id number]  PCP:  Elfredia Nevins, MD  Cardiologist:   Dietrich Pates, MD   Pt presents for follow up of CAD    History of Present Illness: [name] is a 77 y.o. female with a history of CAD, HTN, DM Pt is s/p MI   Underwent PTCA /BMS of LCx in Oct 2008.  LAD with sequential 80% lesions and 60% RCA stenosis Echocardiogram  in August 2013 LVEF was 60 to 65% moderate left ventricular hypertrophy and grade 1 diastolic dysfunction. Myovue in 2016 was normal   Normal perfusion   LVEF normal In July the pt complained of intemritt CP   Myoview showed ischemia in anterior and inferior septum   She went on to have LHC  and underwent PTCA/DES of mild LAD and DES of D1  Plan for ASA/Brilinta for at least 6 months  Since July she went through cardiac rehab.   Currently she  denies CP    Breathing is OK  No palpitations     Current Meds  Medication Sig   amLODipine (NORVASC) 10 MG tablet Take 1 tablet (10 mg total) by mouth daily. (Patient taking differently: Take 10 mg by mouth at bedtime.)   aspirin EC 81 MG tablet Take 81 mg by mouth every morning.   calcium elemental as carbonate (BARIATRIC TUMS ULTRA) 400 MG chewable tablet Chew 1-2 tablets by mouth 2 (two) times daily as needed for indigestion or heartburn.   Cholecalciferol (VITAMIN D) 125 MCG (5000 UT) CAPS Take 1 tablet by mouth in the morning.   empagliflozin (JARDIANCE) 25 MG TABS tablet Take 25 mg by mouth in the morning.   JANUMET XR [phone] MG TB24 Take 1 tablet by mouth in the morning.   lisinopril (PRINIVIL,ZESTRIL) 20 MG tablet Take 20 mg by mouth in the morning.   nitroGLYCERIN (NITROSTAT) 0.4 MG SL tablet Place 1 tablet (0.4 mg total) under the tongue every 5 (five) minutes as needed for chest pain.   Polyethyl Glycol-Propyl Glycol (SYSTANE OP) Place 1 drop into both eyes 2 (two) times daily.   rosuvastatin (CRESTOR) 20 MG  tablet Take 1 tablet (20 mg total) by mouth daily.   ticagrelor (BRILINTA) 90 MG TABS tablet Take 1 tablet (90 mg total) by mouth 2 (two) times daily.     Allergies:   Patient has no known allergies.   Past Medical History:  Diagnosis Date   Arthritis    CAD (coronary artery disease)    a. s/p BMS to LCx in 2008 b. DESx1 to mid-LAD and DESx2 to D1 in 06/2023   Cancer (HCC)    Diabetes mellitus (HCC)    type 2 x  1 or 2 yrs   Fatty liver    GERD (gastroesophageal reflux disease)    High cholesterol    Hypertension    Seasonal allergies     Past Surgical History:  Procedure Laterality Date   CARDIAC CATHETERIZATION  09/11/2007   100% occlusion of the left circumflex. proceed with stenting   CARDIAC CATHETERIZATION  09/11/2007   mid circumflex occlusion with tandem 80% proximal LAD lesion to the 50% mid LAD lesion. nonDES stenting of the circumflex occlusion after recanalization and balloon dilatation, the stent was a 2.75x19mm Liberte, TIMI3 flow was restored   CARDIOVASCULAR STRESS TEST  03/24/2012   normal pattern of perfusion in all  regions, no scintigraphic evidence of inducible myocardial ischemia   CHOLECYSTECTOMY  2008   COLONOSCOPY  06/17/2012   Procedure: COLONOSCOPY;  Surgeon: Malissa Hippo, MD;  [facility];  Service: Endoscopy;  Laterality: N/A;  100   COLONOSCOPY WITH PROPOFOL N/A 01/14/2020   Procedure: COLONOSCOPY WITH PROPOFOL;  Surgeon: Malissa Hippo, MD;  Location: [facility];  Service: Endoscopy;  Laterality: N/A;  135   CORONARY STENT INTERVENTION N/A 06/18/2023   Procedure: CORONARY STENT INTERVENTION;  Surgeon: Marykay Lex, MD;  Location: [facility] INVASIVE CV LAB;  Service: Cardiovascular;  Laterality: N/A;   DOPPLER ECHOCARDIOGRAPHY  07/20/2012   EF 60-65%, wall motion normal, there were no regional wall motion abnormalities   Left breast lumpectomy     LOWER EXTREMITY VENOUS DOPPLER Left 12/27/2011   no evidence of deep vein thrombosis  involving the left lower extremity and right common femoral vein, enlarged inguinal lymph node is visualized on the left. no baker's cyst on the left.   PARTIAL HYSTERECTOMY     RIGHT/LEFT HEART CATH AND CORONARY ANGIOGRAPHY N/A 06/18/2023   Procedure: RIGHT/LEFT HEART CATH AND CORONARY ANGIOGRAPHY;  Surgeon: Marykay Lex, MD;  Location: [facility] INVASIVE CV LAB;  Service: Cardiovascular;  Laterality: N/A;     Social History:  The patient  reports that she has never smoked. She has never been exposed to tobacco smoke. She has never used smokeless tobacco. She reports that she does not drink alcohol and does not use drugs.   Family History:  The patient's family history includes Cancer in her mother; Heart disease in her mother.    ROS:  Please see the history of present illness. All other systems are reviewed and  Negative to the above problem except as noted.    PHYSICAL EXAM: VS:  BP 122/62 (BP Location: Left Arm)   Pulse 66   Ht 5\' 5"  (1.651 m)   Wt 152 lb (68.9 kg)   SpO2 98%   BMI 25.29 kg/m   GEN: Well nourished, well developed, in no acute distress  HEENT: normal  Neck: no JVD, carotid bruits Cardiac: RRR; no murmur  No LE edema  Respiratory:  clear to auscultation bilaterally,  GI: soft, nontender   No hepatomegaly   EKG:  EKG is not ordered today.   LHC   July 2024    CULPRIT LESION SEGMENT #1: Mid LAD-1 lesion is 99% stenosed. Mid LAD-2 lesion is 50% stenosed. Mid LAD-3 lesion is 90% stenosed.  TIMI-3 flow   A drug-eluting stent was successfully placed using a SYNERGY XD 2.75X38. ->  Deployed to 3.0 mm.  Post intervention, there is a 0% residual stenosis throughout the stented segment.  TIMI-3 flow preserved   Mid LAD to Dist LAD lesion is 25% stenosed.   LESION #2 mid 1st Diag-2 lesion is 90% stenosed. ->  TIMI-3 flow   A drug-eluting stent was successfully placed using a STENT ONYX FRONTIER 2.0X12.  Deployed to 2.2 mm.  Post intervention, there is a 0% residual  stenosis.  TIMI-3 flow preserved   LESION #3: Proximal 1st Diag-1 lesion is 80% stenosed.   A second drug-eluting stent was successfully placed overlapping the more distal stent, using a STENT ONYX FRONTIER 2.0X15.  Deployed to 2.2 mm.  Post intervention, there is a 0% residual stenosis. TIMI-3 flow preserved   ---------------------------------------------   Ost Cx to Prox Cx lesion is 50% stenosed.   Previously placed Prox Cx stent of unknown type is  widely patent.   Prox RCA lesion is 50% stenosed.  Gram   ---------------------------------------------   Post intervention, there is a 0% residual stenosis.   LV end diastolic pressure is normal.   There is no aortic valve stenosis.   POST-CATH DIAGNOSES Multivessel disease with severe tandem lesions in both LAD and D1, moderate disease in the distal LAD, proximal LCx prior to stent and proximal RCA. Successful DES PCI of mid LAD covering 99%, 50% and 90% stenoses with a single Synergy XD 2.75 mm x 38 mm deployed to 3.0 mm. Successful DES PCI of tandem 80 then 90% lesions in D1 with overlapping Onyx Frontier DES stents, 2.0 mm x 15 mm and 2.0 mm x 12 mm deployed to 2.2 mm Complex intervention-2 long lesion segment with very long stent placed in the LAD and 2 overlapping shorter stents placed in the diagonal branch due to difficulty manipulating around the bend. Normal Right Heart Cath Pressures and Left Ventricular Pressures.   RECOMMENDATIONS In the absence of any other complications or medical issues, we expect the patient to be ready for discharge from an interventional cardiology perspective on 06/18/2023. Recommend uninterrupted dual antiplatelet therapy with Aspirin 81mg  daily and Ticagrelor 90mg  twice daily for a minimum of 6 months (stable ischemic heart disease-Class I recommendation). After 6 months, can stop aspirin and continue Brilinta monotherapy 60 mg twice daily or switch back to Plavix 75 mg daily.  Would continue lifelong  monotherapy. Continue to titrate GDMT for CAD     Right Heart Pressures PAP-mean 31/12-19 mmHg; PCWP mean 13 mmHg LV EDP is normal. LV P-EDP: 164/2-11 mmHg; AO P-MAP 163/59-96 mmHg. Ao sat 97%, PA sat 71%.  Cardiac Output-Index (Fick) 4.69-2.65-mildly reduced.    Myoview 2024   Findings are consistent with infarction with peri-infarct ischemia. The study is intermediate risk.   No ST deviation was noted. The ECG was negative for ischemia.   LV perfusion is abnormal.  Moderate sized, moderate intensity, mid to apical anteroseptal and inferoseptal defects that are partially reversible and suggestive of infarct scar with moderate peri-infarct ischemia, although wall motion is normal on gated imaging.  Breast attenuation could also be an associated factor as well   Left ventricular function is normal. Nuclear stress EF: 67 %.   Intermediate risk study suggestive of anteroseptal/inferoseptal infarct scar with moderate peri-infarct ischemia in the setting of breast attenuation.  Wall motion overall normal however with LVEF 67%.    Lipid Panel    Component Value Date/Time   CHOL 118 09/17/2023 0935   TRIG 68 09/17/2023 0935   HDL 51 09/17/2023 0935   CHOLHDL 2.3 09/17/2023 0935   VLDL 14 09/17/2023 0935   LDLCALC 53 09/17/2023 0935      Wt Readings from Last 3 Encounters:  09/18/23 152 lb (68.9 kg)  09/01/23 153 lb 9.6 oz (69.7 kg)  08/01/23 154 lb (69.9 kg)      ASSESSMENT AND PLAN:  1   CAD  Pt s/p recent interventions to LAD and Diagonal in July as noted above    Doing well  No CP Will continue ASA and Brillinta.   Transition to single agent in January.   2  HLD  LDL 76 in March, HDL 58    3  DM   A1C ws 7.8 in March   Discussed diet  She is drinking some sodas    4  HTN  Controlled  Keep on current regimen   follow up 6 months  Current medicines are reviewed at length with the patient today.  The patient does not have concerns regarding  medicines.  Signed, Dietrich Pates, MD  09/18/2023 9:03 PM    [facility] [address] [place], [place], [place]  [address] Phone: [phone]; Fax: [phone]

## 2023-09-18 ENCOUNTER — Encounter: Payer: Self-pay | Admitting: Internal Medicine

## 2023-09-18 ENCOUNTER — Ambulatory Visit: Payer: Medicare PPO | Attending: Internal Medicine | Admitting: Internal Medicine

## 2023-09-18 VITALS — BP 122/62 | HR 66 | Ht 65.0 in | Wt 152.0 lb

## 2023-09-18 DIAGNOSIS — I2510 Atherosclerotic heart disease of native coronary artery without angina pectoris: Secondary | ICD-10-CM

## 2023-09-18 NOTE — Patient Instructions (Signed)
Medication Instructions:   Your physician recommends that you continue on your current medications as directed. Please refer to the Current Medication list given to you today.  *If you need a refill on your cardiac medications before your next appointment, please call your pharmacy*   Lab Work: NONE   If you have labs (blood work) drawn today and your tests are completely normal, you will receive your results only by: MyChart Message (if you have MyChart) OR A paper copy in the mail If you have any lab test that is abnormal or we need to change your treatment, we will call you to review the results.   Testing/Procedures: NONE    Follow-Up: At [facility], you and your health needs are our priority.  As part of our continuing mission to provide you with exceptional heart care, we have created designated Provider Care Teams.  These Care Teams include your primary Cardiologist (physician) and Advanced Practice Providers (APPs -  Physician Assistants and Nurse Practitioners) who all work together to provide you with the care you need, when you need it.  We recommend signing up for the patient portal called "MyChart".  Sign up information is provided on this After Visit Summary.  MyChart is used to connect with patients for Virtual Visits (Telemedicine).  Patients are able to view lab/test results, encounter notes, upcoming appointments, etc.  Non-urgent messages can be sent to your provider as well.   To learn more about what you can do with MyChart, go to [email or web address].    Your next appointment:    May   Provider:   You may see Dietrich Pates, MD or one of the following Advanced Practice Providers on your designated Care Team:   Randall An, PA-C  Jacolyn Reedy, PA-C     Other Instructions Thank you for choosing [facility]!

## 2023-09-19 ENCOUNTER — Encounter (HOSPITAL_COMMUNITY): Payer: Medicare PPO

## 2023-09-22 ENCOUNTER — Encounter (HOSPITAL_COMMUNITY): Payer: Medicare PPO

## 2023-09-24 ENCOUNTER — Encounter (HOSPITAL_COMMUNITY): Payer: Medicare PPO

## 2023-09-26 ENCOUNTER — Encounter (HOSPITAL_COMMUNITY): Payer: Medicare PPO

## 2023-09-29 ENCOUNTER — Encounter (HOSPITAL_COMMUNITY): Payer: Medicare PPO

## 2023-10-01 ENCOUNTER — Encounter (HOSPITAL_COMMUNITY): Payer: Medicare PPO

## 2023-10-06 ENCOUNTER — Encounter (HOSPITAL_COMMUNITY): Payer: Medicare PPO

## 2023-10-08 ENCOUNTER — Encounter (HOSPITAL_COMMUNITY): Payer: Medicare PPO

## 2023-10-08 ENCOUNTER — Telehealth: Payer: Self-pay

## 2023-10-08 NOTE — Telephone Encounter (Signed)
-----   Message from [place] sent at 10/07/2023 10:41 PM EST ----- At 6 months (January) would recomm switching patinet to Plavix 75 from asa/brilinta.     See cath report I did not specify with pt    WIll cost less

## 2023-10-08 NOTE — Telephone Encounter (Signed)
Unable to reach the pt.. no answer.. will try again later this afternoon.

## 2023-10-10 NOTE — Telephone Encounter (Signed)
Pt advised and will call us when it gets closer to getting done with her Brilinta.. she just picked up 90 day supply on October 2024 and then we will change her to the Plavix 75 mg only.   12/19/23 will be her 6 months.

## 2023-10-10 NOTE — Telephone Encounter (Signed)
Unable to reach pt... no answer on her home or cell number.

## 2023-10-13 ENCOUNTER — Encounter (HOSPITAL_COMMUNITY): Payer: Medicare PPO

## 2023-10-15 ENCOUNTER — Encounter (HOSPITAL_COMMUNITY): Payer: Medicare PPO

## 2023-10-20 ENCOUNTER — Encounter (HOSPITAL_COMMUNITY): Payer: Medicare PPO

## 2023-10-22 ENCOUNTER — Encounter (HOSPITAL_COMMUNITY): Payer: Medicare PPO

## 2023-10-27 ENCOUNTER — Encounter (HOSPITAL_COMMUNITY): Payer: Medicare PPO

## 2023-10-29 ENCOUNTER — Encounter (HOSPITAL_COMMUNITY): Payer: Medicare PPO

## 2023-11-03 ENCOUNTER — Encounter (HOSPITAL_COMMUNITY): Payer: Medicare PPO

## 2023-11-05 ENCOUNTER — Encounter (HOSPITAL_COMMUNITY): Payer: Medicare PPO

## 2023-11-10 ENCOUNTER — Encounter (HOSPITAL_COMMUNITY): Payer: Medicare PPO

## 2023-12-17 ENCOUNTER — Other Ambulatory Visit (HOSPITAL_COMMUNITY): Payer: Self-pay | Admitting: Internal Medicine

## 2023-12-17 DIAGNOSIS — Z1231 Encounter for screening mammogram for malignant neoplasm of breast: Secondary | ICD-10-CM

## 2023-12-29 DIAGNOSIS — I2510 Atherosclerotic heart disease of native coronary artery without angina pectoris: Secondary | ICD-10-CM | POA: Diagnosis not present

## 2023-12-29 DIAGNOSIS — Z6826 Body mass index (BMI) 26.0-26.9, adult: Secondary | ICD-10-CM | POA: Diagnosis not present

## 2023-12-29 DIAGNOSIS — E663 Overweight: Secondary | ICD-10-CM | POA: Diagnosis not present

## 2023-12-29 DIAGNOSIS — E11319 Type 2 diabetes mellitus with unspecified diabetic retinopathy without macular edema: Secondary | ICD-10-CM | POA: Diagnosis not present

## 2024-01-14 ENCOUNTER — Telehealth: Payer: Self-pay | Admitting: Internal Medicine

## 2024-01-14 MED ORDER — CLOPIDOGREL BISULFATE 75 MG PO TABS: 75.0000 mg | ORAL_TABLET | Freq: Every day | ORAL | 1 refills | Status: DC

## 2024-01-14 NOTE — Telephone Encounter (Signed)
Pt is asking If her plavix can be sent in. She switched from Brilinta to Plavix.

## 2024-01-14 NOTE — Telephone Encounter (Signed)
Pt is requesting a refill on clopidogrel. This medication has not been prescribed yet. Please address

## 2024-01-14 NOTE — Telephone Encounter (Signed)
Per Dr Jacinto Halim the DOD no loading dose needed to start the Plavix 75 mg a day. Pt advised.   Will d/c Brilinta and ASA.

## 2024-01-14 NOTE — Telephone Encounter (Signed)
Attempted to call the pt back but unable to leave a message/ no answer... need to see if she has enough Brilinta left to see if Dr Tenny Craw wants 300 mg or 600 mg Plavix loading dose when she returns next week.   Will send to the Pharm D to see if there is a standard so I can send in for the pt.

## 2024-01-28 ENCOUNTER — Ambulatory Visit (HOSPITAL_COMMUNITY)
Admission: RE | Admit: 2024-01-28 | Discharge: 2024-01-28 | Disposition: A | Discharge status: Home / Self Care | Payer: Medicare PPO | Source: Ambulatory Visit | Attending: Internal Medicine | Admitting: Internal Medicine

## 2024-01-28 DIAGNOSIS — Z1231 Encounter for screening mammogram for malignant neoplasm of breast: Secondary | ICD-10-CM | POA: Insufficient documentation

## 2024-02-10 ENCOUNTER — Other Ambulatory Visit (HOSPITAL_COMMUNITY): Payer: Self-pay

## 2024-02-11 ENCOUNTER — Other Ambulatory Visit (HOSPITAL_COMMUNITY): Payer: Self-pay

## 2024-02-18 DIAGNOSIS — E119 Type 2 diabetes mellitus without complications: Secondary | ICD-10-CM | POA: Diagnosis not present

## 2024-05-06 DIAGNOSIS — Z6826 Body mass index (BMI) 26.0-26.9, adult: Secondary | ICD-10-CM | POA: Diagnosis not present

## 2024-05-06 DIAGNOSIS — Z0001 Encounter for general adult medical examination with abnormal findings: Secondary | ICD-10-CM | POA: Diagnosis not present

## 2024-05-06 DIAGNOSIS — E11319 Type 2 diabetes mellitus with unspecified diabetic retinopathy without macular edema: Secondary | ICD-10-CM | POA: Diagnosis not present

## 2024-05-06 DIAGNOSIS — I2510 Atherosclerotic heart disease of native coronary artery without angina pectoris: Secondary | ICD-10-CM | POA: Diagnosis not present

## 2024-05-06 DIAGNOSIS — E663 Overweight: Secondary | ICD-10-CM | POA: Diagnosis not present

## 2024-05-06 DIAGNOSIS — Z1331 Encounter for screening for depression: Secondary | ICD-10-CM | POA: Diagnosis not present

## 2024-05-22 NOTE — Progress Notes (Unsigned)
 Cardiology Office Note   Date:  05/29/2024   ID:  [name] [birth year], MRN [id number]  PCP:  Bertell Satterfield, MD  Cardiologist:   Vina Gull, MD   Pt presents for follow up of CAD    History of Present Illness: [name] is a 78 y.o. female with a history of CAD, HTN, DM -2008  Pt is s/p MI   Underwent PTCA /BMS of LCx .  LAD with sequential 80% lesions and 60% RCA stenosis 2013  Echocardiogram LVEF was 60 to 65% moderate left ventricular hypertrophy and grade 1 diastolic dysfunction. 2016  Myovue was normal   Normal perfusion   LVEF normal July 2024 Pt complained of intemritt CP   Myoview  showed ischemia in anterior and inferior septum   She went on to have LHC  and underwent PTCA/DES of mild LAD and DES of D1  Plan for ASA/Brilinta  for at least 6 months  Pt completed cardiac rehab   I saw her in clinic in Oct 2024   Since seen she denies CP   Breathing is good      She complains of some numbness in hands, worse ince cath in 2024   Wakes her up in morning  Diet: Breakfast:   1/2 bowl Cheerios Lunch:   Not much Dinner: [employer]    Drink:  Water    Occasdional ginger ale Current Meds  Medication Sig   amLODipine  (NORVASC ) 5 MG tablet Take 5 mg by mouth daily.   calcium  elemental as carbonate (BARIATRIC TUMS ULTRA) 400 MG chewable tablet Chew 1-2 tablets by mouth 2 (two) times daily as needed for indigestion or heartburn.   Cholecalciferol  (VITAMIN D ) 125 MCG (5000 UT) CAPS Take 1 tablet by mouth in the morning.   clopidogrel  (PLAVIX ) 75 MG tablet Take 1 tablet (75 mg total) by mouth daily.   empagliflozin (JARDIANCE) 25 MG TABS tablet Take 25 mg by mouth in the morning.   JANUMET XR [phone] MG TB24 Take 1 tablet by mouth in the morning.   lisinopril  (PRINIVIL ,ZESTRIL ) 20 MG tablet Take 20 mg by mouth in the morning.   nitroGLYCERIN  (NITROSTAT ) 0.4 MG SL tablet Place 1 tablet (0.4 mg total) under the tongue every 5 (five) minutes as needed  for chest pain.   Polyethyl Glycol-Propyl Glycol (SYSTANE OP) Place 1 drop into both eyes 2 (two) times daily.   rosuvastatin  (CRESTOR ) 20 MG tablet Take 1 tablet (20 mg total) by mouth daily.     Allergies:   Patient has no known allergies.   Past Medical History:  Diagnosis Date   Arthritis    CAD (coronary artery disease)    a. s/p BMS to LCx in 2008 b. DESx1 to mid-LAD and DESx2 to D1 in 06/2023   Cancer (HCC)    Diabetes mellitus (HCC)    type 2 x  1 or 2 yrs   Fatty liver    GERD (gastroesophageal reflux disease)    High cholesterol    Hypertension    Seasonal allergies     Past Surgical History:  Procedure Laterality Date   CARDIAC CATHETERIZATION  09/11/2007   100% occlusion of the left circumflex. proceed with stenting   CARDIAC CATHETERIZATION  09/11/2007   mid circumflex occlusion with tandem 80% proximal LAD lesion to the 50% mid LAD lesion. nonDES stenting of the circumflex occlusion after recanalization and balloon dilatation, the stent was a 2.75x36mm  Liberte, TIMI3 flow was restored   CARDIOVASCULAR STRESS TEST  03/24/2012   normal pattern of perfusion in all regions, no scintigraphic evidence of inducible myocardial ischemia   CHOLECYSTECTOMY  2008   COLONOSCOPY  06/17/2012   Procedure: COLONOSCOPY;  Surgeon: Cindy RAYMOND Rivet, MD;  [facility];  Service: Endoscopy;  Laterality: N/A;  100   COLONOSCOPY WITH PROPOFOL  N/A 01/14/2020   Procedure: COLONOSCOPY WITH PROPOFOL ;  Surgeon: [name] Cindy RAYMOND, MD;  [facility];  Service: Endoscopy;  Laterality: N/A;  135   CORONARY STENT INTERVENTION N/A 06/18/2023   Procedure: CORONARY STENT INTERVENTION;  Surgeon: Anner Alm ORN, MD;  Location: [facility] INVASIVE CV LAB;  Service: Cardiovascular;  Laterality: N/A;   DOPPLER ECHOCARDIOGRAPHY  07/20/2012   EF 60-65%, wall motion normal, there were no regional wall motion abnormalities   Left breast lumpectomy     LOWER EXTREMITY VENOUS DOPPLER Left 12/27/2011    no evidence of deep vein thrombosis involving the left lower extremity and right common femoral vein, enlarged inguinal lymph node is visualized on the left. no baker's cyst on the left.   PARTIAL HYSTERECTOMY     RIGHT/LEFT HEART CATH AND CORONARY ANGIOGRAPHY N/A 06/18/2023   Procedure: RIGHT/LEFT HEART CATH AND CORONARY ANGIOGRAPHY;  Surgeon: Anner Alm ORN, MD;  Location: [facility] INVASIVE CV LAB;  Service: Cardiovascular;  Laterality: N/A;     Social History:  The patient  reports that she has never smoked. She has never been exposed to tobacco smoke. She has never used smokeless tobacco. She reports that she does not drink alcohol and does not use drugs.   Family History:  The patient's family history includes Cancer in her mother; Heart disease in her mother.    ROS:  Please see the history of present illness. All other systems are reviewed and  Negative to the above problem except as noted.    PHYSICAL EXAM: VS:  BP (!) 108/56 (BP Location: Left Arm)   Pulse (!) 49   Ht 5' 0.6 (1.539 m)   Wt 149 lb 9.6 oz (67.9 kg)   SpO2 98%   BMI 28.64 kg/m   GEN: Overweight 78 yo  in no acute distress  HEENT: normal  Neck: no JVD, no carotid bruits Cardiac: RRR; no murmurs No LE edema  Respiratory:  clear to auscultation  GI: soft, nontender   No hepatomegaly   EKG:  EKG  shows SB 49 bpm    LHC   July 2024    CULPRIT LESION SEGMENT #1: Mid LAD-1 lesion is 99% stenosed. Mid LAD-2 lesion is 50% stenosed. Mid LAD-3 lesion is 90% stenosed.  TIMI-3 flow   A drug-eluting stent was successfully placed using a SYNERGY XD 2.75X38. ->  Deployed to 3.0 mm.  Post intervention, there is a 0% residual stenosis throughout the stented segment.  TIMI-3 flow preserved   Mid LAD to Dist LAD lesion is 25% stenosed.   LESION #2 mid 1st Diag-2 lesion is 90% stenosed. ->  TIMI-3 flow   A drug-eluting stent was successfully placed using a STENT ONYX FRONTIER 2.0X12.  Deployed to 2.2 mm.  Post intervention,  there is a 0% residual stenosis.  TIMI-3 flow preserved   LESION #3: Proximal 1st Diag-1 lesion is 80% stenosed.   A second drug-eluting stent was successfully placed overlapping the more distal stent, using a STENT ONYX FRONTIER 2.0X15.  Deployed to 2.2 mm.  Post intervention, there is a 0% residual stenosis. TIMI-3 flow preserved   ---------------------------------------------  Ost Cx to Prox Cx lesion is 50% stenosed.   Previously placed Prox Cx stent of unknown type is  widely patent.   Prox RCA lesion is 50% stenosed.  Gram   ---------------------------------------------   Post intervention, there is a 0% residual stenosis.   LV end diastolic pressure is normal.   There is no aortic valve stenosis.   POST-CATH DIAGNOSES Multivessel disease with severe tandem lesions in both LAD and D1, moderate disease in the distal LAD, proximal LCx prior to stent and proximal RCA. Successful DES PCI of mid LAD covering 99%, 50% and 90% stenoses with a single Synergy XD 2.75 mm x 38 mm deployed to 3.0 mm. Successful DES PCI of tandem 80 then 90% lesions in D1 with overlapping Onyx Frontier DES stents, 2.0 mm x 15 mm and 2.0 mm x 12 mm deployed to 2.2 mm Complex intervention-2 long lesion segment with very long stent placed in the LAD and 2 overlapping shorter stents placed in the diagonal branch due to difficulty manipulating around the bend. Normal Right Heart Cath Pressures and Left Ventricular Pressures.   RECOMMENDATIONS In the absence of any other complications or medical issues, we expect the patient to be ready for discharge from an interventional cardiology perspective on 06/18/2023. Recommend uninterrupted dual antiplatelet therapy with Aspirin  81mg  daily and Ticagrelor  90mg  twice daily for a minimum of 6 months (stable ischemic heart disease-Class I recommendation). After 6 months, can stop aspirin  and continue Brilinta  monotherapy 60 mg twice daily or switch back to Plavix  75 mg daily.  Would  continue lifelong monotherapy. Continue to titrate GDMT for CAD     Right Heart Pressures PAP-mean 31/12-19 mmHg; PCWP mean 13 mmHg LV EDP is normal. LV P-EDP: 164/2-11 mmHg; AO P-MAP 163/59-96 mmHg. Ao sat 97%, PA sat 71%.  Cardiac Output-Index (Fick) 4.69-2.65-mildly reduced.    Myoview  2024   Findings are consistent with infarction with peri-infarct ischemia. The study is intermediate risk.   No ST deviation was noted. The ECG was negative for ischemia.   LV perfusion is abnormal.  Moderate sized, moderate intensity, mid to apical anteroseptal and inferoseptal defects that are partially reversible and suggestive of infarct scar with moderate peri-infarct ischemia, although wall motion is normal on gated imaging.  Breast attenuation could also be an associated factor as well   Left ventricular function is normal. Nuclear stress EF: 67 %.   Intermediate risk study suggestive of anteroseptal/inferoseptal infarct scar with moderate peri-infarct ischemia in the setting of breast attenuation.  Wall motion overall normal however with LVEF 67%.    Lipid Panel    Component Value Date/Time   CHOL 118 09/17/2023 0935   TRIG 68 09/17/2023 0935   HDL 51 09/17/2023 0935   CHOLHDL 2.3 09/17/2023 0935   VLDL 14 09/17/2023 0935   LDLCALC 53 09/17/2023 0935      Wt Readings from Last 3 Encounters:  05/28/24 149 lb 9.6 oz (67.9 kg)  09/18/23 152 lb (68.9 kg)  09/01/23 153 lb 9.6 oz (69.7 kg)      ASSESSMENT AND PLAN:  1   CAD  Pt with intervention in July 2024  (DES to LAD and D1)  Continue Plavix   2  HLD  LDL 53  HDL 51  Trig 68     3  DM   A1C ws 6.7   Improved  Continue   4  HTN  BP is well controlled     follow up in winter  Current medicines are reviewed at length with the patient today.  The patient does not have concerns regarding medicines.  Signed, Vina Gull, MD  05/29/2024 10:43 PM    [facility] [address] [place], [place], [place]   [address] Phone: [phone]; Fax: [phone]

## 2024-05-28 ENCOUNTER — Encounter: Payer: Self-pay | Admitting: Internal Medicine

## 2024-05-28 ENCOUNTER — Ambulatory Visit: Attending: Internal Medicine | Admitting: Internal Medicine

## 2024-05-28 VITALS — BP 108/56 | HR 49 | Ht 60.6 in | Wt 149.6 lb

## 2024-05-28 DIAGNOSIS — I2510 Atherosclerotic heart disease of native coronary artery without angina pectoris: Secondary | ICD-10-CM

## 2024-05-28 NOTE — Patient Instructions (Signed)
 Medication Instructions:   Your physician recommends that you continue on your current medications as directed. Please refer to the Current Medication list given to you today.   Labwork: None today  Testing/Procedures: None today  Follow-Up: May 2026 with Dr.Ross  Any Other Special Instructions Will Be Listed Below (If Applicable).  If you need a refill on your cardiac medications before your next appointment, please call your pharmacy.

## 2024-06-28 ENCOUNTER — Other Ambulatory Visit: Payer: Self-pay | Admitting: Internal Medicine

## 2024-07-26 ENCOUNTER — Other Ambulatory Visit: Payer: Self-pay | Admitting: Student

## 2024-10-15 ENCOUNTER — Ambulatory Visit

## 2024-10-15 ENCOUNTER — Telehealth: Payer: Self-pay | Admitting: Family Medicine

## 2024-10-15 ENCOUNTER — Encounter: Payer: Self-pay | Admitting: Family Medicine

## 2024-10-15 ENCOUNTER — Ambulatory Visit: Payer: Self-pay | Admitting: Family Medicine

## 2024-10-15 VITALS — BP 153/68 | HR 56 | Resp 16 | Ht 60.0 in | Wt 148.1 lb

## 2024-10-15 DIAGNOSIS — Z1159 Encounter for screening for other viral diseases: Secondary | ICD-10-CM

## 2024-10-15 DIAGNOSIS — E038 Other specified hypothyroidism: Secondary | ICD-10-CM

## 2024-10-15 DIAGNOSIS — R102 Pelvic and perineal pain unspecified side: Secondary | ICD-10-CM

## 2024-10-15 DIAGNOSIS — E7849 Other hyperlipidemia: Secondary | ICD-10-CM

## 2024-10-15 DIAGNOSIS — E559 Vitamin D deficiency, unspecified: Secondary | ICD-10-CM

## 2024-10-15 DIAGNOSIS — N3090 Cystitis, unspecified without hematuria: Secondary | ICD-10-CM | POA: Diagnosis not present

## 2024-10-15 DIAGNOSIS — Z114 Encounter for screening for human immunodeficiency virus [HIV]: Secondary | ICD-10-CM

## 2024-10-15 DIAGNOSIS — I10 Essential (primary) hypertension: Secondary | ICD-10-CM | POA: Diagnosis not present

## 2024-10-15 DIAGNOSIS — R109 Unspecified abdominal pain: Secondary | ICD-10-CM

## 2024-10-15 DIAGNOSIS — R7301 Impaired fasting glucose: Secondary | ICD-10-CM

## 2024-10-15 MED ORDER — AMLODIPINE-OLMESARTAN 5-20 MG PO TABS
1.0000 | ORAL_TABLET | Freq: Every day | ORAL | 1 refills | Status: AC
Start: 2024-10-15 — End: ?

## 2024-10-15 MED ORDER — SULFAMETHOXAZOLE-TRIMETHOPRIM 800-160 MG PO TABS
1.0000 | ORAL_TABLET | Freq: Two times a day (BID) | ORAL | 0 refills | Status: AC
Start: 2024-10-15 — End: 2024-10-20

## 2024-10-15 NOTE — Telephone Encounter (Signed)
 Informed pt of message about labs and rx,

## 2024-10-15 NOTE — Telephone Encounter (Signed)
 Lvm informing

## 2024-10-15 NOTE — Progress Notes (Signed)
 New Patient Office Visit  Subjective:  Patient ID: [name], female    DOB: February 07, 1946  Age: 78 y.o. MRN: [id number]  CC:  Chief Complaint  Patient presents with   Establish Care    States she has scripts of amlodipine  5 and 10 mg and she doesn't know which to take or if she takes both    Abdominal Pain    Goes and comes for awhile now, hurts in her pelvic area like she did when she had cycles. Hasn't seen any blood but feels like she is having cycles every month    HPI [name] is a 78 y.o. female with past medical history of  HTN, T2DM, HLP presents for establishing care.  The patient reports intermittent abdominal pain that has been occurring for some time. She describes the pain as located in the pelvic area, similar to the discomfort she experienced during her menstrual cycles. Although she has not seen any vaginal bleeding, she feels as though she is having a menstrual cycle each month despite the absence of bleeding. No additional symptoms such as fever, nausea, vomiting, or urinary complaints were reported.   Past Medical History:  Diagnosis Date   Arthritis    CAD (coronary artery disease)    a. s/p BMS to LCx in 2008 b. DESx1 to mid-LAD and DESx2 to D1 in 06/2023   Cancer (HCC)    Diabetes mellitus (HCC)    type 2 x  1 or 2 yrs   Fatty liver    GERD (gastroesophageal reflux disease)    High cholesterol    Hypertension    Seasonal allergies     Past Surgical History:  Procedure Laterality Date   CARDIAC CATHETERIZATION  09/11/2007   100% occlusion of the left circumflex. proceed with stenting   CARDIAC CATHETERIZATION  09/11/2007   mid circumflex occlusion with tandem 80% proximal LAD lesion to the 50% mid LAD lesion. nonDES stenting of the circumflex occlusion after recanalization and balloon dilatation, the stent was a 2.75x43mm Liberte, TIMI3 flow was restored   CARDIOVASCULAR STRESS TEST  03/24/2012   normal pattern of perfusion in all regions, no  scintigraphic evidence of inducible myocardial ischemia   CHOLECYSTECTOMY  2008   COLONOSCOPY  06/17/2012   Procedure: COLONOSCOPY;  Surgeon: Claudis RAYMOND Rivet, MD;  [facility];  Service: Endoscopy;  Laterality: N/A;  100   COLONOSCOPY WITH PROPOFOL  N/A 01/14/2020   Procedure: COLONOSCOPY WITH PROPOFOL ;  Surgeon: Rivet Claudis RAYMOND, MD;  [facility];  Service: Endoscopy;  Laterality: N/A;  135   CORONARY STENT INTERVENTION N/A 06/18/2023   Procedure: CORONARY STENT INTERVENTION;  Surgeon: Anner Alm ORN, MD;  Location: [facility] INVASIVE CV LAB;  Service: Cardiovascular;  Laterality: N/A;   DOPPLER ECHOCARDIOGRAPHY  07/20/2012   EF 60-65%, wall motion normal, there were no regional wall motion abnormalities   Left breast lumpectomy     LOWER EXTREMITY VENOUS DOPPLER Left 12/27/2011   no evidence of deep vein thrombosis involving the left lower extremity and right common femoral vein, enlarged inguinal lymph node is visualized on the left. no baker's cyst on the left.   PARTIAL HYSTERECTOMY     RIGHT/LEFT HEART CATH AND CORONARY ANGIOGRAPHY N/A 06/18/2023   Procedure: RIGHT/LEFT HEART CATH AND CORONARY ANGIOGRAPHY;  Surgeon: Anner Alm ORN, MD;  Location: [facility] INVASIVE CV LAB;  Service: Cardiovascular;  Laterality: N/A;    Family History  Problem Relation Age of Onset   Heart  disease Mother    Cancer Mother        Breast cancer    Social History   Socioeconomic History   Marital status: Single    Spouse name: Not on file   Number of children: 1   Years of education: Not on file   Highest education level: Not on file  Occupational History   Not on file  Tobacco Use   Smoking status: Never    Passive exposure: Never   Smokeless tobacco: Never  Vaping Use   Vaping status: Never Used  Substance and Sexual Activity   Alcohol use: No   Drug use: No   Sexual activity: Not Currently    Birth control/protection: Surgical    Comment: hyst  Other Topics Concern   Not on  file  Social History Narrative   Not on file   Social Drivers of Health   Financial Resource Strain: Not on file  Food Insecurity: Not on file  Transportation Needs: Not on file  Physical Activity: Not on file  Stress: Not on file  Social Connections: Not on file  Intimate Partner Violence: Not on file    ROS Review of Systems  Constitutional:  Negative for chills and fever.  Eyes:  Negative for visual disturbance.  Respiratory:  Negative for chest tightness and shortness of breath.   Gastrointestinal:  Positive for abdominal pain.  Neurological:  Negative for dizziness and headaches.    Objective:   Today's Vitals: BP (!) 153/68   Pulse (!) 56   Resp 16   Ht 5' (1.524 m)   Wt 148 lb 1.9 oz (67.2 kg)   SpO2 97%   BMI 28.93 kg/m   Physical Exam HENT:     Head: Normocephalic.     Mouth/Throat:     Mouth: Mucous membranes are moist.  Cardiovascular:     Rate and Rhythm: Normal rate.     Heart sounds: Normal heart sounds.  Pulmonary:     Effort: Pulmonary effort is normal.     Breath sounds: Normal breath sounds.  Abdominal:     Tenderness: There is no guarding. Negative signs include Rovsing's sign, McBurney's sign and psoas sign.  Neurological:     Mental Status: She is alert.      Assessment & Plan:   Primary hypertension Assessment & Plan: Uncontrolled blood pressure in the clinic today. Encouraged the patient to stop taking lisinopril  20 mg daily, amlodipine  5 mg daily, and amlodipine  10 mg daily. Encouraged the patient to start taking olmesartan-amlodipine  20/5 mg daily for improved blood pressure management. Reinforced adherence to a low-sodium diet and increased physical activity as tolerated. The patient will follow up with the nurses in four weeks to reassess blood pressure control and treatment effectiveness.    Orders: -     amLODIPine -Olmesartan; Take 1 tablet by mouth daily.  Dispense: 30 tablet; Refill: 1 -     Microalbumin / creatinine  urine ratio  Pelvic pain Assessment & Plan: Orders placed for a pelvic ultrasound to evaluate the uterus, ovaries, and adnexa for cysts, fibroids, or other structural causes. -Recommend Tylenol  as needed for pelvic pain if no contraindications. -Encourage use of a heating pad to the lower abdomen for comfort during flare-ups. -Encourage adequate hydration, regular physical activity, and monitoring of symptom patterns. -Advise patient to track symptoms using a monthly pain/cycle calendar to identify trends. -Instruct patient to seek immediate care for severe pain, fever, abnormal bleeding, dizziness, fainting, or persistent nausea/vomiting.  Orders: -  US  PELVIS (TRANSABDOMINAL ONLY)  Cystitis Assessment & Plan: Will treat with Bactrim twice daily for 5 days based on symptoms. Preventive measures for UTI were discussed, including staying well-hydrated, wiping front to back, avoiding prolonged holding of urine, and urinating after intercourse. Patient verbalized understanding.   Orders: -     Urinalysis -     Sulfamethoxazole-Trimethoprim; Take 1 tablet by mouth 2 (two) times daily for 5 days.  Dispense: 10 tablet; Refill: 0  Encounter for screening for HIV -     HIV Antibody (routine testing w rflx)  Need for hepatitis C screening test -     Hepatitis C antibody  Other hyperlipidemia -     Lipid panel -     CMP14+EGFR -     CBC with Differential/Platelet  TSH (thyroid -stimulating hormone deficiency) -     TSH + free T4  Vitamin D  deficiency -     VITAMIN D  25 Hydroxy (Vit-D Deficiency, Fractures)  IFG (impaired fasting glucose) -     Hemoglobin A1c    Note: This chart has been completed using [employer] (consulting) software, and while attempts have been made to ensure accuracy, certain words and phrases may not be transcribed as intended.   Follow-up: Return in about 4 months (around 02/12/2025).   Annalysse Shoemaker  Z Bacchus, FNP

## 2024-10-15 NOTE — Patient Instructions (Addendum)
 I appreciate the opportunity to provide care to you today!    Follow up:  4 months/ 1 month nurse visit for BP reassessment  Fasting Labs: please stop by the lab today to get your blood drawn (CBC, CMP, TSH, Lipid profile, HgA1c, Vit D)  Screening: HIV and Hep C  Hypertension Management  Your current blood pressure is above the target goal of <140/90 mmHg.   Please stop taking lisinopril  20 mg daily, amlodipine  5 mg daily, and amlodipine  10 mg daily.  Start taking olmesartan-amlodipine  20/5 mg daily for improved management of your blood pressure.  Medication Instructions: Take your blood pressure medication at the same time each day. After taking your medication, check your blood pressure at least an hour later. If your first reading is >140/90 mmHg, wait at least 10 minutes and recheck your blood pressure. Side Effects: In the initial days of therapy, you may experience dizziness or lightheadedness as your body adjusts to the lower blood pressure; this is expected. Diet and Lifestyle: Adhere to a low-sodium diet, limiting intake to less than 1500 mg daily, and increase your physical activity. Avoid over-the-counter NSAIDs such as ibuprofen and naproxen while on this medication. Hydration and Nutrition: Stay well-hydrated by drinking at least 64 ounces of water  daily. Increase your servings of fruits and vegetables and avoid excessive sodium in your diet. Long-Term Considerations: Uncontrolled hypertension can increase the risk of cardiovascular diseases, including stroke, coronary artery disease, and heart failure.  Please report to the emergency department if your blood pressure exceeds 180/120 and is accompanied by symptoms such as headaches, chest pain, palpitations, blurred vision, or dizziness.    Abdominal Pain / Pelvic Pain -Orders placed for a pelvic ultrasound to evaluate the uterus, ovaries, and adnexa for cysts, fibroids, or other structural causes. -Recommend Tylenol  as  needed for pelvic pain if no contraindications. -Encourage use of a heating pad to the lower abdomen for comfort during flare-ups. -Encourage adequate hydration, regular physical activity, and monitoring of symptom patterns. -Advise patient to track symptoms using a monthly pain/cycle calendar to identify trends. -Instruct patient to seek immediate care for severe pain, fever, abnormal bleeding, dizziness, fainting, or persistent nausea/vomiting.  Please follow up if your symptoms worsen or fail to improve.  Attached with your AVS, you will find valuable resources for self-education. I highly recommend dedicating some time to thoroughly examine them.   Please continue to a heart-healthy diet and increase your physical activities. Try to exercise for at least five days a week.    It was a pleasure to see you and I look forward to continuing to work together on your health and well-being. Please do not hesitate to call the office if you need care or have questions about your care.  In case of emergency, please visit the Emergency Department for urgent care, or contact our clinic at [phone] to schedule an appointment. We're here to help you!   Have a wonderful day and week. With Gratitude, Meade JENEANE Gerlach MSN, FNP-BC, PMHNP-BC

## 2024-10-16 ENCOUNTER — Ambulatory Visit: Payer: Self-pay | Admitting: Family Medicine

## 2024-10-17 LAB — CBC WITH DIFFERENTIAL/PLATELET
Basophils Absolute: 0 x10E3/uL (ref 0.0–0.2)
Basos: 1 %
EOS (ABSOLUTE): 0.1 x10E3/uL (ref 0.0–0.4)
Eos: 2 %
Hematocrit: 42.2 % (ref 34.0–46.6)
Hemoglobin: 13.6 g/dL (ref 11.1–15.9)
Immature Grans (Abs): 0 x10E3/uL (ref 0.0–0.1)
Immature Granulocytes: 0 %
Lymphocytes Absolute: 2.1 x10E3/uL (ref 0.7–3.1)
Lymphs: 38 %
MCH: 29.1 pg (ref 26.6–33.0)
MCHC: 32.2 g/dL (ref 31.5–35.7)
MCV: 90 fL (ref 79–97)
Monocytes Absolute: 0.4 x10E3/uL (ref 0.1–0.9)
Monocytes: 8 %
Neutrophils Absolute: 2.8 x10E3/uL (ref 1.4–7.0)
Neutrophils: 51 %
Platelets: 212 x10E3/uL (ref 150–450)
RBC: 4.67 x10E6/uL (ref 3.77–5.28)
RDW: 11.8 % (ref 11.7–15.4)
WBC: 5.4 x10E3/uL (ref 3.4–10.8)

## 2024-10-17 LAB — CMP14+EGFR
ALT: 9 IU/L (ref 0–32)
AST: 26 IU/L (ref 0–40)
Albumin: 4.8 g/dL (ref 3.8–4.8)
Alkaline Phosphatase: 92 IU/L (ref 49–135)
BUN/Creatinine Ratio: 20 (ref 12–28)
BUN: 18 mg/dL (ref 8–27)
Bilirubin Total: 0.5 mg/dL (ref 0.0–1.2)
CO2: 23 mmol/L (ref 20–29)
Calcium: 10.2 mg/dL (ref 8.7–10.3)
Chloride: 103 mmol/L (ref 96–106)
Creatinine, Ser: 0.92 mg/dL (ref 0.57–1.00)
Globulin, Total: 2.9 g/dL (ref 1.5–4.5)
Glucose: 84 mg/dL (ref 70–99)
Potassium: 4.4 mmol/L (ref 3.5–5.2)
Sodium: 143 mmol/L (ref 134–144)
Total Protein: 7.7 g/dL (ref 6.0–8.5)
eGFR: 64 mL/min/1.73 (ref >59)

## 2024-10-17 LAB — MICROALBUMIN / CREATININE URINE RATIO
Creatinine, Urine: 118 mg/dL
Microalb/Creat Ratio: 18 mg/g{creat} (ref 0–29)
Microalbumin, Urine: 20.7 ug/mL

## 2024-10-17 LAB — LIPID PANEL
Chol/HDL Ratio: 2.5 ratio (ref 0.0–4.4)
Cholesterol, Total: 158 mg/dL (ref 100–199)
HDL: 62 mg/dL (ref >39)
LDL Chol Calc (NIH): 81 mg/dL (ref 0–99)
Triglycerides: 80 mg/dL (ref 0–149)
VLDL Cholesterol Cal: 15 mg/dL (ref 5–40)

## 2024-10-17 LAB — TSH+FREE T4
Free T4: 1.14 ng/dL (ref 0.82–1.77)
TSH: 0.831 u[IU]/mL (ref 0.450–4.500)

## 2024-10-17 LAB — VITAMIN D 25 HYDROXY (VIT D DEFICIENCY, FRACTURES): Vit D, 25-Hydroxy: 80.4 ng/mL (ref 30.0–100.0)

## 2024-10-17 LAB — HEPATITIS C ANTIBODY: Hep C Virus Ab: NONREACTIVE

## 2024-10-17 LAB — HIV ANTIBODY (ROUTINE TESTING W REFLEX): HIV Screen 4th Generation wRfx: NONREACTIVE

## 2024-10-17 LAB — HEMOGLOBIN A1C
Est. average glucose Bld gHb Est-mCnc: 140 mg/dL
Hgb A1c MFr Bld: 6.5 % — ABNORMAL HIGH (ref 4.8–5.6)

## 2024-10-18 DIAGNOSIS — N3090 Cystitis, unspecified without hematuria: Secondary | ICD-10-CM | POA: Insufficient documentation

## 2024-10-18 DIAGNOSIS — R102 Pelvic and perineal pain unspecified side: Secondary | ICD-10-CM | POA: Insufficient documentation

## 2024-10-18 NOTE — Assessment & Plan Note (Signed)
 Will treat with Bactrim twice daily for 5 days based on symptoms. Preventive measures for UTI were discussed, including staying well-hydrated, wiping front to back, avoiding prolonged holding of urine, and urinating after intercourse. Patient verbalized understanding.

## 2024-10-18 NOTE — Assessment & Plan Note (Signed)
 Orders placed for a pelvic ultrasound to evaluate the uterus, ovaries, and adnexa for cysts, fibroids, or other structural causes. -Recommend Tylenol  as needed for pelvic pain if no contraindications. -Encourage use of a heating pad to the lower abdomen for comfort during flare-ups. -Encourage adequate hydration, regular physical activity, and monitoring of symptom patterns. -Advise patient to track symptoms using a monthly pain/cycle calendar to identify trends. -Instruct patient to seek immediate care for severe pain, fever, abnormal bleeding, dizziness, fainting, or persistent nausea/vomiting.

## 2024-10-18 NOTE — Assessment & Plan Note (Signed)
 Uncontrolled blood pressure in the clinic today. Encouraged the patient to stop taking lisinopril  20 mg daily, amlodipine  5 mg daily, and amlodipine  10 mg daily. Encouraged the patient to start taking olmesartan-amlodipine  20/5 mg daily for improved blood pressure management. Reinforced adherence to a low-sodium diet and increased physical activity as tolerated. The patient will follow up with the nurses in four weeks to reassess blood pressure control and treatment effectiveness.

## 2024-10-19 LAB — URINALYSIS
Bilirubin, UA: NEGATIVE
Ketones, UA: NEGATIVE
Leukocytes,UA: NEGATIVE
Nitrite, UA: POSITIVE — AB
RBC, UA: NEGATIVE
Specific Gravity, UA: 1.025 (ref 1.005–1.030)
Urobilinogen, Ur: 1 mg/dL (ref 0.2–1.0)
pH, UA: 6 (ref 5.0–7.5)

## 2024-10-21 ENCOUNTER — Ambulatory Visit: Payer: Self-pay

## 2024-10-21 ENCOUNTER — Other Ambulatory Visit: Payer: Self-pay | Admitting: Family Medicine

## 2024-10-21 DIAGNOSIS — N3090 Cystitis, unspecified without hematuria: Secondary | ICD-10-CM

## 2024-10-21 MED ORDER — NITROFURANTOIN MONOHYD MACRO 100 MG PO CAPS: 100.0000 mg | ORAL_CAPSULE | Freq: Two times a day (BID) | ORAL | 0 refills | Status: AC

## 2024-10-21 NOTE — Telephone Encounter (Signed)
 Patient advised.

## 2024-10-21 NOTE — Telephone Encounter (Signed)
 FYI Only or Action Required?: Action required by provider: clinical question for provider, update on [name] condition, and refused acute appointment.  [name] was last seen in primary care on 10/15/2024 by Cindy Meade PEDLAR, FNP.  Called Nurse Triage reporting Rash.  Symptoms began several days ago.  Interventions attempted: OTC medications: Benadryl and Other: self d/c Bactrim.  Symptoms are: gradually improving.  Triage Disposition: See Physician Within 24 Hours  [name]/caregiver understands and will follow disposition?: No, wishes to speak with PCP   Copied from CRM #[id number]. Topic: Clinical - Red Word Triage >> Oct 21, 2024  3:06 PM [name] wrote: Red Word that prompted transfer to Nurse Triage: [name] thinks she is having an allergic reaction to medication, sulfamethoxazole-trimethoprim (BACTRIM DS) 800-160 MG tablet. Heart is fluttering, and broke in an itchy rash and bumps keep appearing on her hands, back and neck. Didn't have an issue until started taking medication. Reason for Disposition  Hives or itching  Answer Assessment - Initial Assessment Questions Additional info:  Stopped medication yesterday and symptoms resolving.    1. APPEARANCE: What does the rash look like?      Welts  2. LOCATION: Where is the rash located?      Hand, neck 3. NUMBER: How many hives are there?      Multiple  4. SIZE: How big are the hives? (e.g., inches, cm, compare to coins) Do they all look the same or do they vary in shape and size?      Varies  5. ONSET: When did the hives begin? (e.g., hours or days ago)      Last week  6. ITCHING: Does it itch? If Yes, ask: How bad is the itch?  (e.g., none, mild, moderate, severe)     Very itchy  7. RECURRENT PROBLEM: Have you had hives before? If Yes, ask: When was the last time? and What happened that time?      Denies  8. TRIGGERS: Were you exposed to any new food, plant, cosmetic product or animal just before  the hives began?     Bactrim  9. OTHER SYMPTOMS: Do you have any other symptoms? (e.g., fever, tongue swelling, difficulty breathing, abdomen pain)     Heart fluttering after taking Bactim that has resolved last week.  10. PREGNANCY: Is there any chance you are pregnant? When was your last menstrual period?  Answer Assessment - Initial Assessment Questions Additional info: 1) [name] is requesting change of antibiotic due to itching, rash-hives. She self d/c Bactrim yesterday, took benadryl with effect.  2) Refused acute appointment   1. APPEARANCE of RASH: What does the rash look like? (e.g., spots, blisters, raised areas, skin peeling, scaly)     Hive-welts 2. SIZE: How big are the spots? (e.g., tip of pen, eraser, coin; inches, centimeters)     Varies  3. LOCATION: Where is the rash located?     Neck and hands  4. COLOR: What color is the rash? (Note: It is difficult to assess rash color in people with darker-colored skin. When this situation occurs, simply ask the caller to describe what they see.)     pink 5. ONSET: When did the rash begin?     Last week 6. FEVER: Do you have a fever? If Yes, ask: What is your temperature, how was it measured, and when did it start?     denies 7. ITCHING: Does the rash itch? If Yes, ask: How bad is the itch? (Scale 1-10; or mild,  moderate, severe)     Yes-benadryl relieves symptoms  8. CAUSE: What do you think is causing the rash?     Bactrim-she stopped this yesterday due to rash and itching 9. NEW MEDICINES: What new medicines are you taking? (e.g., name of antibiotic) When did you start taking this medication?.     Bactrim 10. OTHER SYMPTOMS: Do you have any other symptoms? (e.g., sore throat, fever, joint pain)       Denies difficulty breathing and denies difficulty swallowing and/or drooling. Denies all other symptoms I'm not even sure why I am on antibiotic 11. PREGNANCY: Is there any chance you are  pregnant? When was your last menstrual period?  Protocols used: Hives-A-AH, Rash - Widespread On Drugs-A-AH

## 2024-10-21 NOTE — Telephone Encounter (Signed)
 Kindly encourage the patient to start taking Macrobid twice daily for 5 days. The prescription has been sent to the pharmacy.

## 2024-10-21 NOTE — Telephone Encounter (Signed)
 The patient was encouraged to stop taking Bactrim. Please inquire how many days she has been on this treatment regimen in order to determine the appropriate alternative medication to prescribe.

## 2024-10-27 ENCOUNTER — Ambulatory Visit (HOSPITAL_COMMUNITY)
Admission: RE | Admit: 2024-10-27 | Discharge: 2024-10-27 | Disposition: A | Discharge status: Home / Self Care | Source: Ambulatory Visit | Attending: Family Medicine | Admitting: Family Medicine

## 2024-10-27 DIAGNOSIS — R102 Pelvic and perineal pain unspecified side: Secondary | ICD-10-CM | POA: Diagnosis present

## 2024-11-08 NOTE — Progress Notes (Signed)
 Please inform the patient that her ultrasound showed nonvisualization of the ovaries, which is most likely due to normal age-related atrophy and bowel gas obscuring the view. The ultrasound also confirmed findings consistent with her prior hysterectomy. No concerning abnormalities were identified.

## 2024-11-15 ENCOUNTER — Ambulatory Visit

## 2024-11-15 NOTE — Progress Notes (Signed)
 Patient is in office today for a nurse visit for Blood Pressure Check. Patient blood pressure was 117/74, Patient No chest pain, No shortness of breath, No dyspnea on exertion, No orthopnea, No paroxysmal nocturnal dyspnea, No edema, No palpitations, No syncope

## 2024-12-22 ENCOUNTER — Other Ambulatory Visit (HOSPITAL_COMMUNITY): Payer: Self-pay | Admitting: Family Medicine

## 2024-12-22 DIAGNOSIS — Z1231 Encounter for screening mammogram for malignant neoplasm of breast: Secondary | ICD-10-CM

## 2025-01-04 ENCOUNTER — Other Ambulatory Visit: Payer: Self-pay | Admitting: Family Medicine

## 2025-01-27 ENCOUNTER — Ambulatory Visit: Admitting: Family Medicine

## 2025-01-28 ENCOUNTER — Ambulatory Visit (HOSPITAL_COMMUNITY)
